# Patient Record
Sex: Female | Born: 1960 | Race: White | Hispanic: No | Smoking: Current every day smoker
Health system: Southern US, Community
[De-identification: ages and names within clinical notes are randomized; demographics above are authoritative.]

## PROBLEM LIST (undated history)

## (undated) DIAGNOSIS — I10 Essential (primary) hypertension: Secondary | ICD-10-CM

## (undated) DIAGNOSIS — Q159 Congenital malformation of eye, unspecified: Secondary | ICD-10-CM

## (undated) DIAGNOSIS — E785 Hyperlipidemia, unspecified: Secondary | ICD-10-CM

## (undated) DIAGNOSIS — I739 Peripheral vascular disease, unspecified: Secondary | ICD-10-CM

## (undated) DIAGNOSIS — F5104 Psychophysiologic insomnia: Secondary | ICD-10-CM

## (undated) DIAGNOSIS — F32A Depression, unspecified: Secondary | ICD-10-CM

## (undated) DIAGNOSIS — F329 Major depressive disorder, single episode, unspecified: Secondary | ICD-10-CM

## (undated) DIAGNOSIS — K219 Gastro-esophageal reflux disease without esophagitis: Secondary | ICD-10-CM

## (undated) DIAGNOSIS — M549 Dorsalgia, unspecified: Secondary | ICD-10-CM

## (undated) DIAGNOSIS — I2510 Atherosclerotic heart disease of native coronary artery without angina pectoris: Secondary | ICD-10-CM

## (undated) DIAGNOSIS — R0789 Other chest pain: Secondary | ICD-10-CM

## (undated) DIAGNOSIS — Z720 Tobacco use: Secondary | ICD-10-CM

## (undated) HISTORY — DX: Tobacco use: Z72.0

## (undated) HISTORY — DX: Hyperlipidemia, unspecified: E78.5

## (undated) HISTORY — DX: Major depressive disorder, single episode, unspecified: F32.9

## (undated) HISTORY — DX: Essential (primary) hypertension: I10

## (undated) HISTORY — DX: Peripheral vascular disease, unspecified: I73.9

## (undated) HISTORY — DX: Other chest pain: R07.89

## (undated) HISTORY — DX: Atherosclerotic heart disease of native coronary artery without angina pectoris: I25.10

## (undated) HISTORY — DX: Psychophysiologic insomnia: F51.04

## (undated) HISTORY — PX: TUBAL LIGATION: SHX77

## (undated) HISTORY — DX: Depression, unspecified: F32.A

---

## 1994-01-01 HISTORY — PX: BUNIONECTOMY: SHX129

## 1997-01-01 HISTORY — PX: NASAL SINUS SURGERY: SHX719

## 2003-01-02 HISTORY — PX: WRIST SURGERY: SHX841

## 2003-06-21 ENCOUNTER — Encounter: Payer: Self-pay | Admitting: Internal Medicine

## 2003-06-21 LAB — CONVERTED CEMR LAB

## 2004-10-06 ENCOUNTER — Ambulatory Visit: Payer: Self-pay | Admitting: Emergency Medicine

## 2004-10-11 ENCOUNTER — Ambulatory Visit: Payer: Self-pay | Admitting: Emergency Medicine

## 2004-11-16 ENCOUNTER — Ambulatory Visit: Payer: Self-pay | Admitting: Internal Medicine

## 2004-11-20 ENCOUNTER — Ambulatory Visit: Payer: Self-pay | Admitting: Internal Medicine

## 2004-12-11 ENCOUNTER — Ambulatory Visit: Payer: Self-pay | Admitting: Emergency Medicine

## 2004-12-26 ENCOUNTER — Ambulatory Visit: Payer: Self-pay | Admitting: Internal Medicine

## 2005-05-01 ENCOUNTER — Emergency Department (HOSPITAL_COMMUNITY): Admission: EM | Admit: 2005-05-01 | Discharge: 2005-05-01 | Payer: Self-pay | Admitting: *Deleted

## 2005-09-04 ENCOUNTER — Ambulatory Visit: Payer: Self-pay | Admitting: Emergency Medicine

## 2005-09-25 ENCOUNTER — Ambulatory Visit: Payer: Self-pay | Admitting: Pulmonary Disease

## 2005-10-18 ENCOUNTER — Ambulatory Visit: Payer: Self-pay | Admitting: Internal Medicine

## 2005-11-01 ENCOUNTER — Ambulatory Visit: Payer: Self-pay | Admitting: Internal Medicine

## 2005-11-29 ENCOUNTER — Ambulatory Visit: Payer: Self-pay | Admitting: Internal Medicine

## 2006-01-22 ENCOUNTER — Ambulatory Visit (HOSPITAL_COMMUNITY): Admission: RE | Admit: 2006-01-22 | Discharge: 2006-01-22 | Payer: Self-pay | Admitting: Internal Medicine

## 2006-01-22 ENCOUNTER — Ambulatory Visit: Payer: Self-pay | Admitting: Internal Medicine

## 2006-01-29 ENCOUNTER — Ambulatory Visit: Payer: Self-pay | Admitting: Internal Medicine

## 2006-01-29 LAB — CONVERTED CEMR LAB
BUN: 7 mg/dL (ref 6–23)
CO2: 29 meq/L (ref 19–32)
Calcium: 8.9 mg/dL (ref 8.4–10.5)
Chloride: 100 meq/L (ref 96–112)
Creatinine, Ser: 0.9 mg/dL (ref 0.4–1.2)
GFR calc Af Amer: 86 mL/min
GFR calc non Af Amer: 71 mL/min
Glucose, Bld: 103 mg/dL — ABNORMAL HIGH (ref 70–99)
Potassium: 3.5 meq/L (ref 3.5–5.1)
Sodium: 136 meq/L (ref 135–145)

## 2006-02-16 ENCOUNTER — Ambulatory Visit (HOSPITAL_COMMUNITY): Admission: RE | Admit: 2006-02-16 | Discharge: 2006-02-16 | Payer: Self-pay | Admitting: Internal Medicine

## 2006-05-07 ENCOUNTER — Ambulatory Visit: Payer: Self-pay | Admitting: Internal Medicine

## 2006-06-21 ENCOUNTER — Ambulatory Visit (HOSPITAL_COMMUNITY): Admission: RE | Admit: 2006-06-21 | Discharge: 2006-06-21 | Payer: Self-pay | Admitting: Neurosurgery

## 2006-06-21 HISTORY — PX: CERVICAL DISCECTOMY: SHX98

## 2006-08-20 ENCOUNTER — Encounter: Admission: RE | Admit: 2006-08-20 | Discharge: 2006-08-20 | Payer: Self-pay | Admitting: Neurosurgery

## 2006-09-23 ENCOUNTER — Observation Stay (HOSPITAL_COMMUNITY): Admission: AD | Admit: 2006-09-23 | Discharge: 2006-09-26 | Payer: Self-pay | Admitting: Internal Medicine

## 2006-09-23 ENCOUNTER — Ambulatory Visit: Payer: Self-pay | Admitting: Internal Medicine

## 2006-09-23 ENCOUNTER — Ambulatory Visit: Payer: Self-pay | Admitting: Cardiology

## 2006-10-24 ENCOUNTER — Ambulatory Visit: Payer: Self-pay | Admitting: Internal Medicine

## 2007-01-01 ENCOUNTER — Ambulatory Visit: Payer: Self-pay | Admitting: Internal Medicine

## 2007-01-01 DIAGNOSIS — G2581 Restless legs syndrome: Secondary | ICD-10-CM | POA: Insufficient documentation

## 2007-01-01 DIAGNOSIS — D649 Anemia, unspecified: Secondary | ICD-10-CM | POA: Insufficient documentation

## 2007-01-01 DIAGNOSIS — G478 Other sleep disorders: Secondary | ICD-10-CM | POA: Insufficient documentation

## 2007-01-01 DIAGNOSIS — E785 Hyperlipidemia, unspecified: Secondary | ICD-10-CM | POA: Insufficient documentation

## 2007-01-01 DIAGNOSIS — I10 Essential (primary) hypertension: Secondary | ICD-10-CM | POA: Insufficient documentation

## 2007-01-01 DIAGNOSIS — F17200 Nicotine dependence, unspecified, uncomplicated: Secondary | ICD-10-CM | POA: Insufficient documentation

## 2007-01-01 DIAGNOSIS — F3341 Major depressive disorder, recurrent, in partial remission: Secondary | ICD-10-CM | POA: Insufficient documentation

## 2007-01-01 DIAGNOSIS — G4700 Insomnia, unspecified: Secondary | ICD-10-CM | POA: Insufficient documentation

## 2007-01-01 LAB — CONVERTED CEMR LAB
ALT: 13 units/L (ref 0–35)
AST: 14 units/L (ref 0–37)
Albumin: 3.4 g/dL — ABNORMAL LOW (ref 3.5–5.2)
Alkaline Phosphatase: 97 units/L (ref 39–117)
BUN: 8 mg/dL (ref 6–23)
Bilirubin Urine: NEGATIVE
Bilirubin, Direct: 0.1 mg/dL (ref 0.0–0.3)
CO2: 28 meq/L (ref 19–32)
Calcium: 9.1 mg/dL (ref 8.4–10.5)
Chloride: 106 meq/L (ref 96–112)
Cholesterol: 189 mg/dL (ref 0–200)
Creatinine, Ser: 0.8 mg/dL (ref 0.4–1.2)
Crystals: NEGATIVE
GFR calc Af Amer: 98 mL/min
GFR calc non Af Amer: 81 mL/min
Glucose, Bld: 89 mg/dL (ref 70–99)
HDL: 26.6 mg/dL — ABNORMAL LOW (ref >39.0)
Hemoglobin, Urine: NEGATIVE
Ketones, ur: NEGATIVE mg/dL
LDL Cholesterol: 140 mg/dL — ABNORMAL HIGH (ref 0–99)
Leukocytes, UA: NEGATIVE
Mucus, UA: NEGATIVE
Nitrite: NEGATIVE
Potassium: 4.4 meq/L (ref 3.5–5.1)
RBC / HPF: NONE SEEN
Sodium: 140 meq/L (ref 135–145)
Specific Gravity, Urine: <=1.005 (ref 1.000–1.03)
TSH: 1.1 microintl units/mL (ref 0.35–5.50)
Total Bilirubin: 0.4 mg/dL (ref 0.3–1.2)
Total CHOL/HDL Ratio: 7.1
Total Protein, Urine: NEGATIVE mg/dL
Total Protein: 6 g/dL (ref 6.0–8.3)
Triglycerides: 111 mg/dL (ref 0–149)
Urine Glucose: NEGATIVE mg/dL
Urobilinogen, UA: 0.2 (ref 0.0–1.0)
VLDL: 22 mg/dL (ref 0–40)
WBC, UA: NONE SEEN cells/hpf
pH: 6 (ref 5.0–8.0)

## 2007-01-10 LAB — CONVERTED CEMR LAB: CRP: 0.5 mg/dL (ref <0.6)

## 2007-02-26 ENCOUNTER — Encounter: Payer: Self-pay | Admitting: Internal Medicine

## 2007-04-04 ENCOUNTER — Ambulatory Visit: Payer: Self-pay | Admitting: Internal Medicine

## 2007-04-04 DIAGNOSIS — N926 Irregular menstruation, unspecified: Secondary | ICD-10-CM | POA: Insufficient documentation

## 2007-04-09 ENCOUNTER — Telehealth: Payer: Self-pay | Admitting: Internal Medicine

## 2007-05-13 ENCOUNTER — Telehealth: Payer: Self-pay | Admitting: Internal Medicine

## 2007-05-14 ENCOUNTER — Ambulatory Visit: Payer: Self-pay | Admitting: Internal Medicine

## 2007-06-10 ENCOUNTER — Telehealth: Payer: Self-pay | Admitting: Internal Medicine

## 2007-06-24 ENCOUNTER — Telehealth: Payer: Self-pay | Admitting: Internal Medicine

## 2007-06-24 ENCOUNTER — Ambulatory Visit: Payer: Self-pay | Admitting: Internal Medicine

## 2007-06-24 DIAGNOSIS — M79609 Pain in unspecified limb: Secondary | ICD-10-CM | POA: Insufficient documentation

## 2007-06-24 LAB — CONVERTED CEMR LAB: Total CK: 67 units/L (ref 7–177)

## 2007-06-27 ENCOUNTER — Telehealth: Payer: Self-pay | Admitting: Internal Medicine

## 2007-07-07 ENCOUNTER — Encounter: Payer: Self-pay | Admitting: Internal Medicine

## 2007-07-10 ENCOUNTER — Telehealth: Payer: Self-pay | Admitting: Internal Medicine

## 2007-07-10 DIAGNOSIS — I739 Peripheral vascular disease, unspecified: Secondary | ICD-10-CM | POA: Insufficient documentation

## 2007-07-28 ENCOUNTER — Telehealth: Payer: Self-pay | Admitting: Internal Medicine

## 2007-08-26 ENCOUNTER — Ambulatory Visit: Payer: Self-pay | Admitting: Internal Medicine

## 2007-11-03 ENCOUNTER — Ambulatory Visit: Payer: Self-pay | Admitting: Cardiovascular Disease

## 2007-11-19 ENCOUNTER — Ambulatory Visit: Payer: Self-pay | Admitting: Cardiovascular Disease

## 2007-11-19 LAB — CONVERTED CEMR LAB
BUN: 4 mg/dL — ABNORMAL LOW (ref 6–23)
Basophils Absolute: 0 10*3/uL (ref 0.0–0.1)
Basophils Relative: 0.3 % (ref 0.0–3.0)
CO2: 29 meq/L (ref 19–32)
Calcium: 9.2 mg/dL (ref 8.4–10.5)
Chloride: 101 meq/L (ref 96–112)
Creatinine, Ser: 0.8 mg/dL (ref 0.4–1.2)
Eosinophils Absolute: 0.2 10*3/uL (ref 0.0–0.7)
Eosinophils Relative: 1.2 % (ref 0.0–5.0)
GFR calc Af Amer: 98 mL/min
GFR calc non Af Amer: 81 mL/min
Glucose, Bld: 91 mg/dL (ref 70–99)
HCT: 32.6 % — ABNORMAL LOW (ref 36.0–46.0)
Hemoglobin: 10.6 g/dL — ABNORMAL LOW (ref 12.0–15.0)
INR: 1 (ref 0.8–1.0)
Lymphocytes Relative: 15.8 % (ref 12.0–46.0)
MCHC: 32.6 g/dL (ref 30.0–36.0)
MCV: 74.8 fL — ABNORMAL LOW (ref 78.0–100.0)
Monocytes Absolute: 1.1 10*3/uL — ABNORMAL HIGH (ref 0.1–1.0)
Monocytes Relative: 7.6 % (ref 3.0–12.0)
Neutro Abs: 10.5 10*3/uL — ABNORMAL HIGH (ref 1.4–7.7)
Neutrophils Relative %: 75.1 % (ref 43.0–77.0)
Platelets: 367 10*3/uL (ref 150–400)
Potassium: 3.6 meq/L (ref 3.5–5.1)
Prothrombin Time: 11.7 s (ref 10.9–13.3)
RBC: 4.35 M/uL (ref 3.87–5.11)
RDW: 17.5 % — ABNORMAL HIGH (ref 11.5–14.6)
Sodium: 136 meq/L (ref 135–145)
WBC: 14 10*3/uL — ABNORMAL HIGH (ref 4.5–10.5)

## 2007-11-26 ENCOUNTER — Ambulatory Visit (HOSPITAL_COMMUNITY): Admission: RE | Admit: 2007-11-26 | Discharge: 2007-11-26 | Payer: Self-pay | Admitting: Cardiovascular Disease

## 2007-11-26 ENCOUNTER — Encounter: Payer: Self-pay | Admitting: Internal Medicine

## 2007-11-26 ENCOUNTER — Ambulatory Visit: Payer: Self-pay | Admitting: Cardiovascular Disease

## 2007-12-08 ENCOUNTER — Ambulatory Visit: Payer: Self-pay | Admitting: Cardiovascular Disease

## 2008-02-26 ENCOUNTER — Ambulatory Visit (HOSPITAL_BASED_OUTPATIENT_CLINIC_OR_DEPARTMENT_OTHER): Admission: RE | Admit: 2008-02-26 | Discharge: 2008-02-26 | Payer: Self-pay | Admitting: Internal Medicine

## 2008-02-26 ENCOUNTER — Ambulatory Visit: Payer: Self-pay | Admitting: Diagnostic Radiology

## 2008-02-26 ENCOUNTER — Ambulatory Visit: Payer: Self-pay | Admitting: Internal Medicine

## 2008-02-26 DIAGNOSIS — R05 Cough: Secondary | ICD-10-CM

## 2008-02-26 DIAGNOSIS — R059 Cough, unspecified: Secondary | ICD-10-CM | POA: Insufficient documentation

## 2008-02-27 ENCOUNTER — Encounter: Payer: Self-pay | Admitting: Internal Medicine

## 2008-02-27 LAB — CONVERTED CEMR LAB
Basophils Absolute: 0 10*3/uL (ref 0.0–0.1)
Basophils Relative: 0 % (ref 0–1)
Eosinophils Absolute: 0.2 10*3/uL (ref 0.0–0.7)
Eosinophils Relative: 2 % (ref 0–5)
Ferritin: 6 ng/mL — ABNORMAL LOW (ref 10–291)
HCT: 31.2 % — ABNORMAL LOW (ref 36.0–46.0)
Hemoglobin: 9.7 g/dL — ABNORMAL LOW (ref 12.0–15.0)
Iron: 13 ug/dL — ABNORMAL LOW (ref 42–145)
Lymphocytes Relative: 26 % (ref 12–46)
Lymphs Abs: 3.3 10*3/uL (ref 0.7–4.0)
MCHC: 31.1 g/dL (ref 30.0–36.0)
MCV: 75.2 fL — ABNORMAL LOW (ref 78.0–100.0)
Monocytes Absolute: 0.7 10*3/uL (ref 0.1–1.0)
Monocytes Relative: 5 % (ref 3–12)
Neutro Abs: 8.8 10*3/uL — ABNORMAL HIGH (ref 1.7–7.7)
Neutrophils Relative %: 68 % (ref 43–77)
Platelets: 340 10*3/uL (ref 150–400)
RBC: 4.15 M/uL (ref 3.87–5.11)
RDW: 18 % — ABNORMAL HIGH (ref 11.5–15.5)
Saturation Ratios: 3 % — ABNORMAL LOW (ref 20–55)
TIBC: 447 ug/dL (ref 250–470)
Transferrin: 376 mg/dL — ABNORMAL HIGH (ref 212–360)
UIBC: 434 ug/dL
WBC: 13 10*3/uL — ABNORMAL HIGH (ref 4.0–10.5)

## 2008-02-29 ENCOUNTER — Encounter: Payer: Self-pay | Admitting: Internal Medicine

## 2008-03-15 ENCOUNTER — Telehealth: Payer: Self-pay | Admitting: Internal Medicine

## 2008-03-16 ENCOUNTER — Ambulatory Visit: Payer: Self-pay | Admitting: Internal Medicine

## 2008-03-16 DIAGNOSIS — J209 Acute bronchitis, unspecified: Secondary | ICD-10-CM | POA: Insufficient documentation

## 2008-03-16 DIAGNOSIS — N39498 Other specified urinary incontinence: Secondary | ICD-10-CM | POA: Insufficient documentation

## 2008-03-22 ENCOUNTER — Telehealth: Payer: Self-pay | Admitting: Internal Medicine

## 2008-03-23 ENCOUNTER — Telehealth: Payer: Self-pay | Admitting: Internal Medicine

## 2008-04-12 ENCOUNTER — Ambulatory Visit: Payer: Self-pay | Admitting: Radiology

## 2008-04-12 ENCOUNTER — Ambulatory Visit (HOSPITAL_BASED_OUTPATIENT_CLINIC_OR_DEPARTMENT_OTHER): Admission: RE | Admit: 2008-04-12 | Discharge: 2008-04-12 | Payer: Self-pay | Admitting: Internal Medicine

## 2008-04-12 ENCOUNTER — Ambulatory Visit: Payer: Self-pay | Admitting: Internal Medicine

## 2008-04-26 ENCOUNTER — Ambulatory Visit: Payer: Self-pay | Admitting: Oncology

## 2008-04-28 LAB — CONVERTED CEMR LAB: Pap Smear: NORMAL

## 2008-04-29 ENCOUNTER — Telehealth: Payer: Self-pay | Admitting: Internal Medicine

## 2008-04-29 ENCOUNTER — Encounter: Payer: Self-pay | Admitting: Internal Medicine

## 2008-04-29 LAB — CHCC SMEAR

## 2008-04-29 LAB — CBC WITH DIFFERENTIAL/PLATELET
Basophils Absolute: 0 10*3/uL (ref 0.0–0.1)
Eosinophils Absolute: 0.1 10*3/uL (ref 0.0–0.5)
HGB: 7.2 g/dL — ABNORMAL LOW (ref 11.6–15.9)
MONO#: 0.6 10*3/uL (ref 0.1–0.9)
NEUT#: 7.5 10*3/uL — ABNORMAL HIGH (ref 1.5–6.5)
Platelets: 300 10*3/uL (ref 145–400)
RBC: 3.11 10*6/uL — ABNORMAL LOW (ref 3.70–5.45)
RDW: 19.1 % — ABNORMAL HIGH (ref 11.2–14.5)
WBC: 10.5 10*3/uL — ABNORMAL HIGH (ref 3.9–10.3)

## 2008-04-29 LAB — COMPREHENSIVE METABOLIC PANEL
ALT: 11 U/L (ref 0–35)
AST: 15 U/L (ref 0–37)
Alkaline Phosphatase: 102 U/L (ref 39–117)
Creatinine, Ser: 0.66 mg/dL (ref 0.40–1.20)
Total Bilirubin: 0.5 mg/dL (ref 0.3–1.2)

## 2008-04-29 LAB — MORPHOLOGY: PLT EST: ADEQUATE

## 2008-04-30 ENCOUNTER — Encounter (HOSPITAL_COMMUNITY): Admission: RE | Admit: 2008-04-30 | Discharge: 2008-05-06 | Payer: Self-pay | Admitting: Oncology

## 2008-04-30 LAB — HAPTOGLOBIN: Haptoglobin: 154 mg/dL (ref 16–200)

## 2008-04-30 LAB — VITAMIN B12: Vitamin B-12: 300 pg/mL (ref 211–911)

## 2008-04-30 LAB — DIRECT ANTIGLOBULIN TEST (NOT AT ARMC): DAT (Complement): NEGATIVE

## 2008-04-30 LAB — FOLATE: Folate: 7 ng/mL

## 2008-04-30 LAB — IRON AND TIBC: %SAT: 3 % — ABNORMAL LOW (ref 20–55)

## 2008-04-30 LAB — FERRITIN: Ferritin: 6 ng/mL — ABNORMAL LOW (ref 10–291)

## 2008-04-30 LAB — TYPE & CROSSMATCH - CHCC

## 2008-05-05 ENCOUNTER — Telehealth: Payer: Self-pay | Admitting: Internal Medicine

## 2008-05-13 LAB — CBC WITH DIFFERENTIAL/PLATELET
BASO%: 0.1 % (ref 0.0–2.0)
Basophils Absolute: 0 10*3/uL (ref 0.0–0.1)
EOS%: 0.9 % (ref 0.0–7.0)
MCH: 26.5 pg (ref 25.1–34.0)
MCHC: 32.8 g/dL (ref 31.5–36.0)
MCV: 80.6 fL (ref 79.5–101.0)
MONO%: 3.1 % (ref 0.0–14.0)
RBC: 3.41 10*6/uL — ABNORMAL LOW (ref 3.70–5.45)
RDW: 22.3 % — ABNORMAL HIGH (ref 11.2–14.5)
lymph#: 2.8 10*3/uL (ref 0.9–3.3)

## 2008-05-26 ENCOUNTER — Telehealth: Payer: Self-pay | Admitting: Internal Medicine

## 2008-05-27 LAB — CBC WITH DIFFERENTIAL/PLATELET
Eosinophils Absolute: 0.2 10*3/uL (ref 0.0–0.5)
HGB: 11.6 g/dL (ref 11.6–15.9)
MCV: 84.6 fL (ref 79.5–101.0)
MONO%: 5.1 % (ref 0.0–14.0)
NEUT#: 7.5 10*3/uL — ABNORMAL HIGH (ref 1.5–6.5)
RBC: 4.11 10*6/uL (ref 3.70–5.45)
RDW: 27 % — ABNORMAL HIGH (ref 11.2–14.5)
WBC: 10.8 10*3/uL — ABNORMAL HIGH (ref 3.9–10.3)
lymph#: 2.6 10*3/uL (ref 0.9–3.3)

## 2008-06-01 ENCOUNTER — Ambulatory Visit (HOSPITAL_COMMUNITY): Admission: RE | Admit: 2008-06-01 | Discharge: 2008-06-01 | Payer: Self-pay | Admitting: Obstetrics and Gynecology

## 2008-06-01 ENCOUNTER — Encounter (INDEPENDENT_AMBULATORY_CARE_PROVIDER_SITE_OTHER): Payer: Self-pay | Admitting: Obstetrics and Gynecology

## 2008-06-01 HISTORY — PX: ENDOMETRIAL ABLATION: SHX621

## 2008-06-08 ENCOUNTER — Ambulatory Visit: Payer: Self-pay | Admitting: Oncology

## 2008-07-08 ENCOUNTER — Telehealth: Payer: Self-pay | Admitting: Internal Medicine

## 2008-07-20 ENCOUNTER — Ambulatory Visit: Payer: Self-pay | Admitting: Oncology

## 2008-08-24 ENCOUNTER — Telehealth: Payer: Self-pay | Admitting: Internal Medicine

## 2008-09-20 ENCOUNTER — Telehealth: Payer: Self-pay | Admitting: Internal Medicine

## 2008-10-04 ENCOUNTER — Telehealth: Payer: Self-pay | Admitting: Internal Medicine

## 2008-11-30 ENCOUNTER — Telehealth: Payer: Self-pay | Admitting: Internal Medicine

## 2009-03-14 ENCOUNTER — Ambulatory Visit: Payer: Self-pay | Admitting: Diagnostic Radiology

## 2009-03-14 ENCOUNTER — Ambulatory Visit (HOSPITAL_BASED_OUTPATIENT_CLINIC_OR_DEPARTMENT_OTHER): Admission: RE | Admit: 2009-03-14 | Discharge: 2009-03-14 | Payer: Self-pay | Admitting: Internal Medicine

## 2009-03-14 ENCOUNTER — Encounter (INDEPENDENT_AMBULATORY_CARE_PROVIDER_SITE_OTHER): Payer: Self-pay | Admitting: *Deleted

## 2009-03-14 ENCOUNTER — Ambulatory Visit: Payer: Self-pay | Admitting: Internal Medicine

## 2009-03-14 LAB — CONVERTED CEMR LAB
ALT: 11 units/L (ref 0–35)
AST: 13 units/L (ref 0–37)
Albumin: 4.3 g/dL (ref 3.5–5.2)
Alkaline Phosphatase: 101 units/L (ref 39–117)
BUN: 7 mg/dL (ref 6–23)
Bilirubin, Direct: 0.1 mg/dL (ref 0.0–0.3)
CO2: 23 meq/L (ref 19–32)
Calcium: 9.8 mg/dL (ref 8.4–10.5)
Chloride: 106 meq/L (ref 96–112)
Cholesterol: 115 mg/dL (ref 0–200)
Creatinine, Ser: 0.79 mg/dL (ref 0.40–1.20)
Glucose, Bld: 70 mg/dL (ref 70–99)
HCT: 44.7 % (ref 36.0–46.0)
HDL: 31 mg/dL — ABNORMAL LOW (ref >39)
Hemoglobin: 14.7 g/dL (ref 12.0–15.0)
Indirect Bilirubin: 0.2 mg/dL (ref 0.0–0.9)
LDL Cholesterol: 56 mg/dL (ref 0–99)
MCHC: 32.9 g/dL (ref 30.0–36.0)
MCV: 92.9 fL (ref 78.0–100.0)
Platelets: 361 10*3/uL (ref 150–400)
Potassium: 4.1 meq/L (ref 3.5–5.3)
RBC: 4.81 M/uL (ref 3.87–5.11)
RDW: 14.1 % (ref 11.5–15.5)
Sodium: 143 meq/L (ref 135–145)
TSH: 2.022 microintl units/mL (ref 0.350–4.500)
Total Bilirubin: 0.3 mg/dL (ref 0.3–1.2)
Total CHOL/HDL Ratio: 3.7
Total Protein: 6.6 g/dL (ref 6.0–8.3)
Triglycerides: 141 mg/dL (ref <150)
VLDL: 28 mg/dL (ref 0–40)
WBC: 18 10*3/uL — ABNORMAL HIGH (ref 4.0–10.5)

## 2009-03-14 LAB — HM MAMMOGRAPHY: HM Mammogram: NEGATIVE

## 2009-03-16 ENCOUNTER — Encounter (INDEPENDENT_AMBULATORY_CARE_PROVIDER_SITE_OTHER): Payer: Self-pay | Admitting: *Deleted

## 2009-03-17 ENCOUNTER — Telehealth: Payer: Self-pay | Admitting: Internal Medicine

## 2009-03-17 ENCOUNTER — Ambulatory Visit: Payer: Self-pay | Admitting: Gastroenterology

## 2009-03-17 DIAGNOSIS — D72829 Elevated white blood cell count, unspecified: Secondary | ICD-10-CM | POA: Insufficient documentation

## 2009-03-18 ENCOUNTER — Ambulatory Visit: Payer: Self-pay | Admitting: Hematology & Oncology

## 2009-03-21 ENCOUNTER — Telehealth: Payer: Self-pay | Admitting: Internal Medicine

## 2009-04-19 ENCOUNTER — Ambulatory Visit: Payer: Self-pay | Admitting: Hematology & Oncology

## 2009-05-03 ENCOUNTER — Telehealth: Payer: Self-pay | Admitting: Internal Medicine

## 2009-07-01 ENCOUNTER — Ambulatory Visit: Payer: Self-pay | Admitting: Diagnostic Radiology

## 2009-07-01 ENCOUNTER — Ambulatory Visit (HOSPITAL_BASED_OUTPATIENT_CLINIC_OR_DEPARTMENT_OTHER): Admission: RE | Admit: 2009-07-01 | Discharge: 2009-07-01 | Payer: Self-pay | Admitting: Internal Medicine

## 2009-07-01 ENCOUNTER — Ambulatory Visit: Payer: Self-pay | Admitting: Internal Medicine

## 2009-07-01 DIAGNOSIS — J309 Allergic rhinitis, unspecified: Secondary | ICD-10-CM | POA: Insufficient documentation

## 2009-07-01 LAB — CONVERTED CEMR LAB: IgE (Immunoglobulin E), Serum: 139.3 intl units/mL (ref 0.0–180.0)

## 2009-07-08 ENCOUNTER — Telehealth: Payer: Self-pay | Admitting: Internal Medicine

## 2009-09-12 ENCOUNTER — Ambulatory Visit: Payer: Self-pay | Admitting: Internal Medicine

## 2009-09-12 LAB — CONVERTED CEMR LAB
ALT: 11 units/L (ref 0–35)
AST: 13 units/L (ref 0–37)
Albumin: 4.2 g/dL (ref 3.5–5.2)
Alkaline Phosphatase: 92 units/L (ref 39–117)
BUN: 15 mg/dL (ref 6–23)
Bilirubin, Direct: 0.1 mg/dL (ref 0.0–0.3)
CO2: 26 meq/L (ref 19–32)
Calcium: 9.9 mg/dL (ref 8.4–10.5)
Chloride: 104 meq/L (ref 96–112)
Cholesterol: 212 mg/dL — ABNORMAL HIGH (ref 0–200)
Creatinine, Ser: 0.82 mg/dL (ref 0.40–1.20)
Glucose, Bld: 74 mg/dL (ref 70–99)
HDL: 34 mg/dL — ABNORMAL LOW (ref >39)
Indirect Bilirubin: 0.4 mg/dL (ref 0.0–0.9)
LDL Cholesterol: 147 mg/dL — ABNORMAL HIGH (ref 0–99)
Potassium: 4.3 meq/L (ref 3.5–5.3)
Sodium: 140 meq/L (ref 135–145)
Total Bilirubin: 0.5 mg/dL (ref 0.3–1.2)
Total CHOL/HDL Ratio: 6.2
Total CK: 44 units/L (ref 7–177)
Total Protein: 6.4 g/dL (ref 6.0–8.3)
Triglycerides: 153 mg/dL — ABNORMAL HIGH (ref <150)
VLDL: 31 mg/dL (ref 0–40)

## 2009-09-13 ENCOUNTER — Encounter: Payer: Self-pay | Admitting: Internal Medicine

## 2009-09-16 ENCOUNTER — Telehealth: Payer: Self-pay | Admitting: Internal Medicine

## 2009-09-19 ENCOUNTER — Telehealth: Payer: Self-pay | Admitting: Internal Medicine

## 2009-10-03 ENCOUNTER — Telehealth: Payer: Self-pay | Admitting: Internal Medicine

## 2009-10-05 ENCOUNTER — Ambulatory Visit (HOSPITAL_BASED_OUTPATIENT_CLINIC_OR_DEPARTMENT_OTHER)
Admission: RE | Admit: 2009-10-05 | Discharge: 2009-10-05 | Payer: Self-pay | Source: Home / Self Care | Admitting: Internal Medicine

## 2009-10-05 ENCOUNTER — Ambulatory Visit: Payer: Self-pay | Admitting: Internal Medicine

## 2009-10-05 ENCOUNTER — Ambulatory Visit: Payer: Self-pay | Admitting: Radiology

## 2009-10-05 DIAGNOSIS — M545 Low back pain, unspecified: Secondary | ICD-10-CM | POA: Insufficient documentation

## 2009-10-07 ENCOUNTER — Telehealth: Payer: Self-pay | Admitting: Internal Medicine

## 2009-10-24 ENCOUNTER — Encounter: Payer: Self-pay | Admitting: Internal Medicine

## 2009-11-07 ENCOUNTER — Telehealth: Payer: Self-pay | Admitting: Internal Medicine

## 2009-12-12 ENCOUNTER — Telehealth: Payer: Self-pay | Admitting: Internal Medicine

## 2010-01-18 ENCOUNTER — Ambulatory Visit (HOSPITAL_BASED_OUTPATIENT_CLINIC_OR_DEPARTMENT_OTHER)
Admission: RE | Admit: 2010-01-18 | Discharge: 2010-01-18 | Payer: Self-pay | Source: Home / Self Care | Attending: Internal Medicine | Admitting: Internal Medicine

## 2010-01-18 ENCOUNTER — Ambulatory Visit
Admission: RE | Admit: 2010-01-18 | Discharge: 2010-01-18 | Payer: Self-pay | Source: Home / Self Care | Attending: Internal Medicine | Admitting: Internal Medicine

## 2010-01-18 DIAGNOSIS — M25559 Pain in unspecified hip: Secondary | ICD-10-CM | POA: Insufficient documentation

## 2010-01-18 DIAGNOSIS — M25552 Pain in left hip: Secondary | ICD-10-CM | POA: Insufficient documentation

## 2010-01-22 ENCOUNTER — Encounter: Payer: Self-pay | Admitting: Internal Medicine

## 2010-01-27 ENCOUNTER — Telehealth: Payer: Self-pay | Admitting: Internal Medicine

## 2010-01-27 ENCOUNTER — Encounter (HOSPITAL_COMMUNITY)
Admission: RE | Admit: 2010-01-27 | Discharge: 2010-01-31 | Payer: Self-pay | Source: Home / Self Care | Attending: Internal Medicine | Admitting: Internal Medicine

## 2010-01-31 NOTE — Progress Notes (Signed)
Summary: insurance will not pay for colonoscopy   Phone Note Call from Patient Call back at Work Phone [phone]   Caller: Patient Call For: Reinaldo Helt  Summary of Call: was in last Monday and was told she needs a colonoscopy.  Her insurance will not pay for the colonoscopy.  Will she be ok without the test.   Her white blood cell count was high and she is [facility].  She wants to know how long her blood cell count has been high.  She has an appt on the 30th of this month.  Is that too long to wait.   Initial call taken by: Roselle Locus,  March 21, 2009 3:10 PM  Follow-up for Phone Call        discussed elevated white count.  she has mild elevation in the past.  no hx of splenic injury.  she will f/u with hematology for further eval.  I suggest she complete fobt immunoassay.  Darlene, please mail kit to pt  (Give time frame for pt to complete 1-2 weeks) Follow-up by: [name] DO,  March 21, 2009 4:29 PM  Additional Follow-up for Phone Call Additional follow up Details #1::        Card mailed to patient Additional Follow-up by: Glendell Docker CMA,  March 22, 2009 8:30 AM

## 2010-01-31 NOTE — Progress Notes (Signed)
Summary: Results  Phone Note Call from Patient Call back at Home Phone [phone]   Summary of Call: Patient is requesting results of "pressure test"  Initial call taken by: Lamar Sprinkles,  July 10, 2007 9:52 AM  Follow-up for Phone Call        we have results for [facility].  Please confirm with pt that this is the name she used.  left message on pt's ans machine to call [facility] office re: results. Follow-up by: [name] DO,  July 10, 2007 1:15 PM  Additional Follow-up for Phone Call Additional follow up Details #1::        discussed results of ABI.  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  I rec start of pletal and follow up with vasc specialist.  Pt instructed to restart simvastatin.  Discussed importance of tob cessation. Additional Follow-up by: [name] DO,  July 11, 2007 10:10 AM  New Problems: CLAUDICATION (ICD-443.9)   New Problems: CLAUDICATION (ICD-443.9) New/Updated Medications: PLETAL 100 MG  TABS (CILOSTAZOL) one by mouth two times a day   Prescriptions: PLETAL 100 MG  TABS (CILOSTAZOL) one by mouth two times a day  #60 x 1   Entered and Authorized by:   [name] DO   Signed by:   [name] DO on 07/11/2007   Method used:   Electronically sent to ...       [facility] [address]*       [address] [place], [place]  [address]       Ph: [phone]       Fax: [phone]   RxID:   [id number]

## 2010-01-31 NOTE — Miscellaneous (Signed)
Summary: [facility] PV  Clinical Lists Changes  Medications: Added new medication of MOVIPREP 100 GM  SOLR (PEG-KCL-NACL-NASULF-NA ASC-C) As per prep instructions. - Signed Rx of MOVIPREP 100 GM  SOLR (PEG-KCL-NACL-NASULF-NA ASC-C) As per prep instructions.;  #1 x 0;  Signed;  Entered by: Ezra Sites RN;  Authorized by: Rachael Fee MD;  Method used: Electronically to [id number] [address], [place], [place]  [address], Ph: [phone] or [phone], Fax: [phone] Observations: Added new observation of ALLERGY REV: Done (03/17/2009 16:00)    Prescriptions: MOVIPREP 100 GM  SOLR (PEG-KCL-NACL-NASULF-NA ASC-C) As per prep instructions.  #1 x 0   Entered by:   Ezra Sites RN   Authorized by:   Rachael Fee MD   Signed by:   Ezra Sites RN on 03/17/2009   Method used:   Electronically to        [id number] (retail)       [address]       [place], [place]  [address]       Ph: [phone] or [phone]       Fax: [phone]   RxID:   [id number]

## 2010-01-31 NOTE — Letter (Signed)
Summary: [facility] Instructions  [facility]  [address] [place], [place] [address]   Phone: [phone]  Fax: [phone]       [name] [name]    1961-01-01    MRN: [id number]        Procedure Day Dorna Bloom:  Wednesday  03/30/2009     Arrival Time: 12:30 am      Procedure Time: 1:30 am     Location of Procedure:                    _x _  [facility] (4th Floor)                        PREPARATION FOR COLONOSCOPY WITH MOVIPREP   Starting 5 days prior to your procedure Friday 3/25 do not eat nuts, seeds, popcorn, corn, beans, peas,  salads, or any raw vegetables.  Do not take any fiber supplements (e.g. Metamucil, Citrucel, and Benefiber).  THE DAY BEFORE YOUR PROCEDURE         DATE: Tuesday 3/29  1.  Drink clear liquids the entire day-NO SOLID FOOD  2.  Do not drink anything colored red or purple.  Avoid juices with pulp.  No orange juice.  3.  Drink at least 64 oz. (8 glasses) of fluid/clear liquids during the day to prevent dehydration and help the prep work efficiently.  CLEAR LIQUIDS INCLUDE: Water Jello Ice Popsicles Tea (sugar ok, no milk/cream) Powdered fruit flavored drinks Coffee (sugar ok, no milk/cream) Gatorade Juice: apple, white grape, white cranberry  Lemonade Clear bullion, consomm, broth Carbonated beverages (any kind) Strained chicken noodle soup Hard Candy                             4.  In the morning, mix first dose of MoviPrep solution:    Empty 1 Pouch A and 1 Pouch B into the disposable container    Add lukewarm drinking water to the top line of the container. Mix to dissolve    Refrigerate (mixed solution should be used within 24 hrs)  5.  Begin drinking the prep at 5:00 p.m. The MoviPrep container is divided by 4 marks.   Every 15 minutes drink the solution down to the next mark (approximately 8 oz) until the full liter is complete.   6.  Follow completed prep with 16 oz of clear liquid of your choice  (Nothing red or purple).  Continue to drink clear liquids until bedtime.  7.  Before going to bed, mix second dose of MoviPrep solution:    Empty 1 Pouch A and 1 Pouch B into the disposable container    Add lukewarm drinking water to the top line of the container. Mix to dissolve    Refrigerate  THE DAY OF YOUR PROCEDURE      DATE: Wednesday 3/30  Beginning at 8:30 am  (5 hours before procedure):         1. Every 15 minutes, drink the solution down to the next mark (approx 8 oz) until the full liter is complete.  2. Follow completed prep with 16 oz. of clear liquid of your choice.    3. You may drink clear liquids until 11:30 am am  (2 HOURS BEFORE PROCEDURE).   MEDICATION INSTRUCTIONS  Unless otherwise instructed, you should take regular prescription medications with a small sip of water   as early as  possible the morning of your procedure.           OTHER INSTRUCTIONS  You will need a responsible adult at least 52 years of age to accompany you and drive you home.   This person must remain in the waiting room during your procedure.  Wear loose fitting clothing that is easily removed.  Leave jewelry and other valuables at home.  However, you may wish to bring a book to read or  an iPod/MP3 player to listen to music as you wait for your procedure to start.  Remove all body piercing jewelry and leave at home.  Total time from sign-in until discharge is approximately 2-3 hours.  You should go home directly after your procedure and rest.  You can resume normal activities the  day after your procedure.  The day of your procedure you should not:   Drive   Make legal decisions   Operate machinery   Drink alcohol   Return to work  You will receive specific instructions about eating, activities and medications before you leave.    The above instructions have been reviewed and explained to me by   Ezra Sites RN  March 17, 2009 4:42 PM     I fully  understand and can verbalize these instructions _____________________________ Date _________

## 2010-01-31 NOTE — Progress Notes (Signed)
Summary: refill request--clonazepam  Phone Note Refill Request Message from:  [facility] # [phone]  on October 03, 2009 3:15 PM  Refills Requested: Medication #1:  KLONOPIN 1 MG  TABS one by mouth once daily as needed [facility] from [facility] [place] requesting refill on Clonazepam.  Left message on pharmacy voicemail that request has been denied as pt was given written rx on 09/12/09 #90. Nicki Guadalajara Fergerson CMA Duncan Dull)  October 03, 2009 3:17 PM

## 2010-01-31 NOTE — Letter (Signed)
   [place] at [facility] [address] [place], [place]  [address]  [place] Phone: [phone]      September 13, 2009   [facility] [address] [place], [place] [address]  RE:  LAB RESULTS  Dear  Ms. Mcguiness,  The following is an interpretation of your most recent lab tests.  Please take note of any instructions provided or changes to medications that have resulted from your lab work.  ELECTROLYTES:  Good - no changes needed  KIDNEY FUNCTION TESTS:  Good - no changes needed  LIVER FUNCTION TESTS:  Good - no changes needed  LIPID PANEL:  Fair - review at your next visit Triglyceride: 153   Cholesterol: 212   LDL: 147   HDL: 34   Chol/HDL%:  6.2 Ratio        Sincerely Yours,    Dr. Thomos Lemons  Appended Document:  mailed

## 2010-01-31 NOTE — Progress Notes (Signed)
  Phone Note Outgoing Call   Summary of Call: call pt - blood test shows elevated white blood cell count.  I suggest referral to hematologist  Initial call taken by: [name] DO,  March 17, 2009 5:06 PM  Follow-up for Phone Call        pt. called wanting to know what her labs showed because she got a call from cancer center wanting to see her. I informed patient that she has a elevated white blood count and Dr.Tenessa Marsee wanted her to be seen by Hemotology. Follow-up by: Michaelle Copas,  March 18, 2009 11:49 AM  New Problems: LEUKOCYTOSIS (ICD-288.60)   New Problems: LEUKOCYTOSIS (ICD-288.60)

## 2010-01-31 NOTE — Progress Notes (Signed)
Summary: Pain Medication Refill  Phone Note Call from Patient Call back at Work Phone [phone]   Caller: Patient Call For: [name] DO Summary of Call: patient called and left voice message stating she was seen for back pain, she is now having pain in her shoulder blade  and down her arm for the past 5 days. She would like to know if Dr [name] would refill a [facility]cod[facility] [address] in [place] [phone] Initial call taken by: Glendell Docker CMA,  November 07, 2009 11:12 AM  Follow-up for Phone Call        pt asking for pain medication different than prev visit. we can not refill pain meds w/o OV Follow-up by: [name] DO,  November 07, 2009 4:21 PM  Additional Follow-up for Phone Call Additional follow up Details #1::        call returned to patient at [phone], she has been advised per Dr [name] instructions. She states she is still in [place] and will call back to schedule office visit Additional Follow-up by: Glendell Docker CMA,  November 07, 2009 4:32 PM

## 2010-01-31 NOTE — Progress Notes (Signed)
  Phone Note Call from Patient   Caller: Patient Details for Reason: Hematologist Summary of Call: Pt has been referral to Dr Myna Hidalgo, but cannot make her appointments, because of job and location.(lives in [place])  she wants to be referred to a Hematologist in [place] Initial call taken by: Darral Dash,  May 03, 2009 2:44 PM  Follow-up for Phone Call        please change referral to hematologist in [place] Follow-up by: [name] DO,  May 03, 2009 5:34 PM  Additional Follow-up for Phone Call Additional follow up Details #1::        Call place to  [place]  [phone]   Additional Follow-up by: Darral Dash,  May 10, 2009 2:26 PM    Additional Follow-up for Phone Call Additional follow up Details #2::    Appt  [facility] OncologHemotogy  [facility]  Dr Mayford Knife   May 24th  @   11am  Pt notified Follow-up by: Darral Dash,  May 17, 2009 2:37 PM

## 2010-01-31 NOTE — Progress Notes (Signed)
Summary: Back pain  Phone Note Call from Patient   Caller: Patient Details for Reason: Back Pain Summary of Call: Patient seen Monday Sept  12 for muscle pain.  having lower back pain keeping her up at night   . Medication Tramadol given is not helping  please call pt   [phone] Initial call taken by: Darral Dash,  September 16, 2009 11:11 AM  Follow-up for Phone Call        Pt advised rx was sent to pharmacy for Hydrocodone-acetaminophen and to stop Tramadol per Dr Olegario Messier instructions. Nicki Guadalajara Fergerson CMA Duncan Dull)  September 16, 2009 5:34 PM     New/Updated Medications: HYDROCODONE-ACETAMINOPHEN 5-500 MG TABS (HYDROCODONE-ACETAMINOPHEN) one by mouth two times a day as needed Prescriptions: HYDROCODONE-ACETAMINOPHEN 5-500 MG TABS (HYDROCODONE-ACETAMINOPHEN) one by mouth two times a day as needed  #30 x 0   Entered and Authorized by:   [name] DO   Signed by:   [name] DO on 09/16/2009   Method used:   Telephoned to ...       [facility] [place] [id number] (retail)       [address]       [place], [place]  [address]       Ph: [phone] or [phone]       Fax: [phone]   RxID:   [id number]

## 2010-01-31 NOTE — Progress Notes (Signed)
Summary: MRI Results  Phone Note Outgoing Call   Summary of Call: call pt - MRI of LS spine negative for signature spinal stenosis or nerve root compression  If acute symptoms are better,  I suggest referral to PT Initial call taken by: [name] DO,  October 07, 2009 3:32 PM  Follow-up for Phone Call        call placed to patient at [phone] she has been advised per Dr [name] instructions. Follow-up by: Glendell Docker CMA,  October 07, 2009 3:59 PM

## 2010-01-31 NOTE — Progress Notes (Signed)
Summary: Lab results  Phone Note Call from Patient Call back at Home Phone [phone]   Caller: Patient Reason for Call: Lab or Test Results Summary of Call: Pt called to see if we had lab results, advised pt results not avail yet but we will call when avail Initial call taken by: Lannette Donath,  July 08, 2009 2:35 PM  Follow-up for Phone Call        call pt - allergy panel was negative.    pt still may have allergies ( testing may be false negative) Follow-up by: [name] DO,  July 12, 2009 1:21 PM  Additional Follow-up for Phone Call Additional follow up Details #1::        patient advised per Dr [name] instructions Additional Follow-up by: Glendell Docker CMA,  July 12, 2009 1:52 PM

## 2010-01-31 NOTE — Assessment & Plan Note (Signed)
Summary: Follow Up/MHF   Vital Signs:  Patient profile:   50 year old female Height:      59 inches Weight:      118 pounds BMI:     23.92 O2 Sat:      97 % on Room air Temp:     97.4 degrees F oral Pulse rate:   94 / minute Pulse rhythm:   regular Resp:     16 per minute BP sitting:   106 / 70  (left arm) Cuff size:   regular  Vitals Entered By: Glendell Docker CMA (September 12, 2009 11:08 AM)  O2 Flow:  Room air CC: follow-up visit Is Patient Diabetic? No Pain Assessment Patient in pain? no      Comments refill on medications- printed for mail order , c/o daily muscle aches for the past 3-4 months   Primary Care Provider:  Dondra Spry DO  CC:  follow-up visit.  History of Present Illness:  Hyperlipidemia Follow-Up      This is a 50 year old woman who presents for Hyperlipidemia follow-up.  The patient reports muscle aches.  The patient denies the following symptoms: chest pain/pressure.  Compliance with medications (by patient report) has been near 100%.  Dietary compliance has been fair.  The patient reports no exercise.    htn - stable depression - stable  Preventive Screening-Counseling & Management  Alcohol-Tobacco     Smoking Status: current  Allergies: 1)  ! 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   Head:  normocephalic and atraumatic.   Neck:  No deformities, masses, or tenderness noted.no carotid bruits.   Lungs:  normal respiratory effort, normal breath sounds, and no wheezes.   Heart:  normal rate, regular rhythm, and no gallop.   Psych:  normally interactive, good eye contact, not anxious appearing, and not depressed appearing.     Impression & Recommendations:  Problem # 1:  HYPERLIPIDEMIA (ICD-272.4) pt c/o myalgia.  consider statin effect.  pt advised to hold x 1 week, then restart at lowe dose.  Her updated medication list for this problem includes:    Simvastatin 10 Mg Tabs (Simvastatin) ..... One by mouth qpm  Orders: T-Hepatic Function [phone]) T-Lipid Profile [phone]) T-CK Total [phone])  Labs Reviewed: SGOT: 13 (03/14/2009)   SGPT: 11 (03/14/2009)   HDL:31 (03/14/2009), 26.6 (01/01/2007)  LDL:56 (03/14/2009), 140 (32/95/1884)  Chol:115 (03/14/2009), 189 (01/01/2007)  Trig:141 (03/14/2009), 111 (01/01/2007)  Problem # 2:  HYPERTENSION (ICD-401.9)  Her updated medication list for this problem includes:    Losartan Potassium 100 Mg Tabs (Losartan potassium) ..... One by mouth once daily    Amlodipine Besylate 10 Mg Tabs (  Amlodipine besylate) ..... One by mouth once daily  Orders: T-Basic Metabolic Panel [id number])  BP today: 106/70 Prior BP: 134/78 (07/01/2009)  Labs Reviewed: K+: 4.1 (03/14/2009) Creat: : 0.79 (03/14/2009)   Chol: 115 (03/14/2009)   HDL: 31 (03/14/2009)   LDL: 56 (03/14/2009)   TG: 141 (03/14/2009)  Problem # 3:  DEPRESSION (ICD-311) Assessment: Unchanged  Her updated medication list for this problem includes:    Paroxetine Hcl 40 Mg Tabs (Paroxetine hcl) .Marland Kitchen... Take 1 tablet by mouth once a day    Klonopin 1 Mg Tabs (Clonazepam) ..... One by mouth once daily as needed  Complete  Medication List: 1)  Paroxetine Hcl 40 Mg Tabs (Paroxetine hcl) .... Take 1 tablet by mouth once a day 2)  Losartan Potassium 100 Mg Tabs (Losartan potassium) .... One by mouth once daily 3)  Ropinirole Hcl 2 Mg Tabs (Ropinirole hcl) .... One by mouth at bedtime prn 4)  Zolpidem Tartrate 10 Mg Tabs (Zolpidem tartrate) .... One by mouth at bedtime as needed 5)  Amlodipine Besylate 10 Mg Tabs (Amlodipine besylate) .... One by mouth once daily 6)  Klonopin 1 Mg Tabs (Clonazepam) .... One by mouth once daily as needed 7)  Simvastatin 10 Mg Tabs (Simvastatin) .... One by mouth qpm 8)  Fluticasone Propionate 50 Mcg/act Susp (Fluticasone propionate) .... 2 sprays each nostril once daily 9)  Azelastine Hcl 137 Mcg/spray Soln (Azelastine hcl) .... 2 sprays each nostril two times a day 10)  Hydrocodone-acetaminophen 5-500 Mg Tabs (Hydrocodone-acetaminophen) .... One by mouth two times a day as needed  Patient Instructions: 1)  Please schedule a follow-up appointment in 6 months. Prescriptions: KLONOPIN 1 MG  TABS (CLONAZEPAM) one by mouth once daily as needed  #90 x 0   Entered and Authorized by:   [name] DO   Signed by:   [name] DO on 09/12/2009   Method used:   Print then Give to Patient   RxID:   [id number] ROPINIROLE HCL 2 MG TABS (ROPINIROLE HCL) one by mouth at bedtime prn  #30 x 0   Entered and Authorized by:   [name] DO   Signed by:   [name] DO on 09/12/2009   Method used:   Print then Give to Patient   RxID:   [id number] TRAMADOL HCL 50 MG TABS (TRAMADOL HCL) one by mouth two times a day as needed  #30 x 0   Entered and Authorized by:   [name] DO   Signed by:   [name] DO on 09/12/2009   Method used:   Print then Give to Patient   RxID:   [id number] ROPINIROLE HCL 2 MG TABS (ROPINIROLE HCL) one by mouth at bedtime prn  #30 x 0   Entered and Authorized by:   [name] DO   Signed by:   [name] DO on 09/12/2009    Method used:   Electronically to        [id number]       [address]       [place], [place]  [address]       Ph: [phone] or [phone]       Fax: [phone]   RxID:   [id number] ZOLPIDEM TARTRATE 10 MG  TABS (ZOLPIDEM TARTRATE) one by mouth at bedtime as needed  #90 x 1   Entered and Authorized by:   [name] DO  Signed by:   [name] DO on 09/12/2009   Method used:   Print then Give to Patient   RxID:   [id number] AMLODIPINE BESYLATE 10 MG TABS (AMLODIPINE BESYLATE) one by mouth once daily  #90 x 1   Entered and Authorized by:   [name] DO   Signed by:   [name] DO on 09/12/2009   Method used:   Print then Give to Patient   RxID:   [id number] ROPINIROLE HCL 2 MG TABS (ROPINIROLE HCL) one by mouth at bedtime prn  #90 x 1   Entered and Authorized by:   [name] DO   Signed by:   [name] DO on 09/12/2009   Method used:   Print then Give to Patient   RxID:   [id number] PAROXETINE HCL 40 MG TABS (PAROXETINE HCL) Take 1 tablet by mouth once a day  #90 x 1   Entered and Authorized by:   [name] DO   Signed by:   [name] DO on 09/12/2009   Method used:   Print then Give to Patient   RxID:   [id number] LOSARTAN POTASSIUM 100 MG TABS (LOSARTAN POTASSIUM) one by mouth once daily  #90 x 1   Entered and Authorized by:   [name] DO   Signed by:   [name] DO on 09/12/2009   Method used:   Print then Give to Patient   RxID:   [id number] SIMVASTATIN 10 MG TABS (SIMVASTATIN) one by mouth qpm  #90 x 1   Entered and Authorized by:   [name] DO   Signed by:   [name] DO on 09/12/2009   Method used:   Electronically to        [id number] ([facility]       [address]       [place], [place]  [address]       Ph: [phone] or [phone]       Fax: [phone]   RxID:   [id number]    Immunization History:  Influenza Immunization  History:    Influenza:  declined (09/12/2009)   Contraindications/Deferment of Procedures/Staging:    Test/Procedure: FLU VAX    Reason for deferment: patient declined   Current Allergies (reviewed today): ! * DECONGESTANTS

## 2010-01-31 NOTE — Assessment & Plan Note (Signed)
Summary: med check/mhf   Vital Signs:  Patient profile:   50 year old female Height:      59 inches Weight:      111.75 pounds BMI:     22.65 O2 Sat:      99 % on Room air Temp:     97.9 degrees F oral Pulse rate:   78 / minute Pulse rhythm:   regular Resp:     18 per minute BP sitting:   130 / 80  (right arm) Cuff size:   regular  Vitals Entered By: Glendell Docker CMA (March 14, 2009 9:23 AM)  O2 Flow:  Room air CC: Rm-2 Follow up disease management   Primary Care Provider:  Dondra Spry DO  CC:  Rm-2 Follow up disease management.  History of Present Illness: 50 y/o white female with PMHx of htn, hyperlipidemia, tob use and depression for routine CPX overall doing well  no significant change in medical hx   she states neck gets reds whenever she is stressed or upset  Preventive Screening-Counseling & Management  Alcohol-Tobacco     Smoking Status: current  Allergies: 1)  ! 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  The patient denies fever, weight gain, chest pain, prolonged cough, abdominal pain, melena, hematochezia, severe indigestion/heartburn, and depression.    Physical  Exam  General:  alert, well-developed, and well-nourished.   Head:  normocephalic and atraumatic.   Eyes:  pupils equal, pupils round, and pupils reactive to light.   Ears:  R ear normal and L ear normal.   Mouth:  pharynx pink and moist.   Neck:  No deformities, masses, or tenderness noted.no carotid bruits.   Lungs:  normal respiratory effort, normal breath sounds, no crackles, and no wheezes.   Heart:  normal rate, regular rhythm, no murmur, and no gallop.   Abdomen:  soft, non-tender, normal bowel sounds, no hepatomegaly, and no splenomegaly.   Extremities:  No lower extremity edema  Neurologic:  cranial nerves II-XII intact and gait normal.   Psych:  normally interactive, good eye contact, not anxious appearing, and not depressed appearing.     Impression & Recommendations:  Problem # 1:  PREVENTIVE HEALTH CARE (ICD-V70.0) Reviewed adult health maintenance protocols. I urged tob cessation Orders: Mammogram (Screening) (Mammo) Gastroenterology Referral (GI)  Mammogram: Done (06/17/2000) Pap smear: normal (04/28/2008) Td Booster: Td (06/21/2003)   Flu Vax: Declined (03/14/2009)   Chol: 189 (01/01/2007)   HDL: 26.6 (01/01/2007)   LDL: 140 (01/01/2007)   TG: 111 (01/01/2007) TSH: 1.10 (01/01/2007)     Problem # 2:  HYPERTENSION (ICD-401.9) stable.  Maintain current medication regimen.  Her updated medication list for this problem  includes:    Lisinopril 40 Mg Tabs (Lisinopril) ..... One by mouth once daily    Amlodipine Besylate 10 Mg Tabs (Amlodipine besylate) ..... One by mouth once daily  Orders: T-Basic Metabolic Panel [id number])  BP today: 130/80 Prior BP: 142/70 (04/12/2008)  Labs Reviewed: K+: 3.6 (11/19/2007) Creat: : 0.8 (11/19/2007)   Chol: 189 (01/01/2007)   HDL: 26.6 (01/01/2007)   LDL: 140 (01/01/2007)   TG: 111 (01/01/2007)  Problem # 3:  HYPERLIPIDEMIA (ICD-272.4) She is not taking simivastatin regularly.  I encouraged compliance  Her updated  medication list for this problem includes:    Simvastatin 40 Mg Tabs (Simvastatin) ..... One by mouth qpm  Labs Reviewed: SGOT: 14 (01/01/2007)   SGPT: 13 (01/01/2007)   HDL:26.6 (01/01/2007)  LDL:140 (01/01/2007)  Chol:189 (01/01/2007)  Trig:111 (01/01/2007)  Problem # 4:  DEPRESSION (ICD-311) stable.  Maintain current medication regimen.  Her updated medication list for this problem includes:    Paroxetine Hcl 40 Mg Tabs (Paroxetine hcl) .Marland Kitchen... Take 1 tablet by mouth once a day    Klonopin 1 Mg Tabs (Clonazepam) ..... One by mouth once daily as needed  Complete Medication List: 1)  Paroxetine Hcl 40 Mg Tabs (Paroxetine hcl) .... Take 1 tablet by mouth once a day 2)  Lisinopril 40 Mg Tabs (Lisinopril) .... One by mouth once daily 3)  Ropinirole Hcl 2 Mg Tabs (Ropinirole hcl) .... One by mouth at bedtime prn 4)  Zolpidem Tartrate 10 Mg Tabs (Zolpidem tartrate) .... One by mouth at bedtime as needed 5)  Amlodipine Besylate 10 Mg Tabs (Amlodipine besylate) .... One by mouth once daily 6)  Klonopin 1 Mg Tabs (Clonazepam) .... One by mouth once daily as needed 7)  Simvastatin 40 Mg Tabs (Simvastatin) .... One by mouth qpm 8)  Nasonex 50 Mcg/act Susp (Mometasone furoate) .... 2 sprays each nostril qd 9)  Moviprep 100 Gm Solr (Peg-kcl-nacl-nasulf-na asc-c) .... As per prep instructions.  Other Orders: T-Hepatic Function [id number]) T-Lipid Profile [id number]) T-CBC No Diff ([id number]) T-TSH ([id number])  Patient Instructions: 1)  Please schedule a follow-up appointment in 6 months. Prescriptions: KLONOPIN 1 MG  TABS (CLONAZEPAM) one by mouth once daily as needed  #90 x 0   Entered and Authorized by:   [name] DO   Signed by:   [name] DO on 03/14/2009   Method used:   Print then Give to Patient   RxID:   [id number] ZOLPIDEM TARTRATE 10 MG  TABS (ZOLPIDEM TARTRATE) one by mouth at bedtime as needed  #90 x 1   Entered and Authorized by:   [name] DO    Signed by:   [name] DO on 03/14/2009   Method used:   Print then Give to Patient   RxID:   [id number] SIMVASTATIN 40 MG TABS (SIMVASTATIN) one by mouth qpm  #90 x 2   Entered by:   Glendell Docker CMA   Authorized by:   [name] DO   Signed by:   [name] DO on 03/14/2009   Method used:   Electronically to        [facility]* (retail)       [address]       [place], [place]         Ph: [phone]       Fax: [phone]   RxID:   [id number] AMLODIPINE BESYLATE 10 MG TABS (AMLODIPINE BESYLATE) one by mouth once daily  #90  x 2   Entered by:   Glendell Docker CMA   Authorized by:   [name] DO   Signed by:   [name] DO on 03/14/2009   Method used:   Electronically to        [facility]* (retail)       [address]       [place], [place]         Ph: [phone]       Fax: [phone]   RxID:   [id number] ROPINIROLE HCL 2 MG TABS (ROPINIROLE HCL) one by mouth at bedtime prn  #90 x 0   Entered by:   Glendell Docker CMA   Authorized by:   [name] DO   Signed by:   [name] DO on 03/14/2009   Method used:   Electronically to        [facility]* (retail)       [address]       [place], [place]         Ph: [phone]       Fax: [phone]   RxID:   [id number] LISINOPRIL 40 MG TABS (LISINOPRIL) one by mouth once daily  #90 x 3   Entered by:   Glendell Docker CMA   Authorized by:   [name] DO   Signed by:   [name] DO on 03/14/2009   Method used:   Electronically to        [facility]* (retail)       [address]       [place], [place]         Ph: [phone]       Fax: [phone]   RxID:   [id number] PAROXETINE HCL 40 MG TABS (PAROXETINE HCL) Take 1 tablet by mouth once a day  #90 x 2   Entered by:   Glendell Docker CMA   Authorized by:   [name] DO   Signed by:   [name] DO on 03/14/2009   Method used:   Electronically to          [facility]* (retail)       [address]       [place], [place]         Ph: [phone]       Fax: [phone]   RxID:   [id number]   Current Allergies (reviewed today): ! * DECONGESTANTS   Immunization History:  Influenza Immunization History:    Influenza:  declined (03/14/2009)   Contraindications/Deferment of Procedures/Staging:    Test/Procedure: FLU VAX    Reason for deferment: patient declined    Preventive Care Screening  Pap Smear:    Date:  04/28/2008    Results:  normal

## 2010-01-31 NOTE — Letter (Signed)
Summary: Previsit letter  [facility]  [address] [place], [place] [address]   Phone: [phone]  Fax: [phone]       03/14/2009 MRN: [id number]  [facility] [address] [place], [place]  [address]  Dear Ms. Harbach,  Welcome to the Gastroenterology Division at [facility].    You are scheduled to see a nurse for your pre-procedure visit on 03-17-09 at 4:00p.m. on the 3rd floor at [facility], [address]  We ask that you try to arrive at our office 15 minutes prior to your appointment time to allow for check-in.  Your nurse visit will consist of discussing your medical and surgical history, your immediate family medical history, and your medications.    Please bring a complete list of all your medications or, if you prefer, bring the medication bottles and we will list them.  We will need to be aware of both prescribed and over the counter drugs.  We will need to know exact dosage information as well.  If you are on blood thinners (Coumadin, Plavix, Aggrenox, Ticlid, etc.) please call our office today/prior to your appointment, as we need to consult with your physician about holding your medication.   Please be prepared to read and sign documents such as consent forms, a financial agreement, and acknowledgement forms.  If necessary, and with your consent, a friend or relative is welcome to sit-in on the nurse visit with you.  Please bring your insurance card so that we may make a copy of it.  If your insurance requires a referral to see a specialist, please bring your referral form from your primary care physician.  No co-pay is required for this nurse visit.     If you cannot keep your appointment, please call [phone] to cancel or reschedule prior to your appointment date.  This allows [place] the opportunity to schedule an appointment for another patient in need of care.    Thank you for choosing [facility] for your  medical needs.  We appreciate the opportunity to care for you.  Please visit [place] at our website  to learn more about our practice.                     Sincerely.                                                                                                                   The Gastroenterology Division

## 2010-01-31 NOTE — Assessment & Plan Note (Signed)
Summary: pain in her back/mhf   Vital Signs:  Patient profile:   50 year old female Height:      59 inches Weight:      119 pounds BMI:     24.12 O2 Sat:      98 % on Room air Temp:     97.9 degrees F oral Pulse rate:   74 / minute Pulse rhythm:   regular Resp:     16 per minute BP sitting:   132 / 70  (right arm) Cuff size:   regular  Vitals Entered By: Glendell Docker CMA (October 05, 2009 11:05 AM)  O2 Flow:  Room air  Contraindications/Deferment of Procedures/Staging:    Test/Procedure: FLU VAX    Reason for deferment: patient declined  CC: Back Pain Is Patient Diabetic? No Pain Assessment Patient in pain? yes     Location: back Intensity: 4 Type: burning Onset of pain  Constant Comments c/o burning sensation radiating bilaterally to sides with a soreness when touched ongoing for the past  month   Primary Care Provider:  Dondra Spry DO  CC:  Back Pain.  History of Present Illness: 50 y/o white female c/o low back pain worse with walking and laying on her side (left or right) pain radiating to right foot severity - 4 out of 10,  severity can be 10 out of 10 burning , stabbing sensation sensation no prev hx of back pain onset 2 months  no relief with tramadol hydrocodone takes the edge off  currently lives in apart with stairs she has difficulty getting up and down stairs pt thinking of temporarily living with her sister  no urinary complaints no fever  no prev hx of back injury  Preventive Screening-Counseling & Management  Alcohol-Tobacco     Smoking Status: current  Allergies: 1)  ! 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   Lungs:  normal respiratory effort, normal breath sounds, and no wheezes.   Heart:  normal rate, regular rhythm, and no gallop.   Extremities:  No lower extremity edema Neurologic:  cranial nerves II-XII intact.  hyperreflexia of patelar reflex - left > right   Impression & Recommendations:  Problem # 1:  LOW BACK PAIN SYNDROME, SEVERE (ICD-724.2) 50 y/o female with symptoms of lumbar radiculopathy. obtain MRI of LS spine trial of prednisone  Her updated medication list for this problem includes:    Hydrocodone-acetaminophen 5-500 Mg Tabs (Hydrocodone-acetaminophen) ..... One by mouth two times a day as needed  Orders: MRI without Contrast (MRI w/o Contrast)  Complete Medication List: 1)  Paroxetine Hcl 40 Mg Tabs (Paroxetine hcl) .... Take 1 tablet by mouth once a day 2)  Losartan Potassium 100 Mg Tabs (Losartan potassium) .... One by mouth once daily 3)  Ropinirole Hcl 2 Mg Tabs (Ropinirole hcl) .... One by mouth at bedtime prn 4)  Zolpidem Tartrate 10 Mg Tabs (Zolpidem tartrate) .... One by mouth at bedtime as needed 5)  Amlodipine Besylate 10 Mg  Tabs (Amlodipine besylate) .... One by mouth once daily 6)  Klonopin 1 Mg Tabs (Clonazepam) .... One by mouth once daily as needed 7)  Simvastatin 10 Mg Tabs (Simvastatin) .... One by mouth qpm 8)  Fluticasone Propionate 50 Mcg/act Susp (Fluticasone propionate) .... 2 sprays each nostril once daily 9)  Azelastine Hcl 137 Mcg/spray Soln (Azelastine hcl) .... 2 sprays each nostril two times a day 10)  Hydrocodone-acetaminophen 5-500 Mg Tabs (Hydrocodone-acetaminophen) .... One by mouth two times a day as needed 11)  Lyrica 50 Mg Caps (Pregabalin) .... One by mouth three times a day as directed 12)  Prednisone 10 Mg Tabs (Prednisone) .... 3 tabs by mouth  once daily x 3 days, 2 tabs by mouth once daily x 3 days, 1 tab by mouth once daily x 3 days  Patient Instructions: 1)  Please schedule a follow-up appointment in 2 weeks. Prescriptions: PREDNISONE 10 MG TABS (PREDNISONE) 3 tabs by mouth once daily x 3 days, 2 tabs by mouth once daily x 3 days, 1 tab by mouth once daily x 3 days  #18 x 0   Entered and Authorized by:   [name] DO   Signed by:   [name] DO on 10/05/2009   Method used:   Print then Give to Patient   RxID:   [id number] HYDROCODONE-ACETAMINOPHEN 5-500 MG TABS (HYDROCODONE-ACETAMINOPHEN) one by mouth two times a day as needed  #30 x 0   Entered and Authorized by:   [name] DO   Signed by:   [name] DO on 10/05/2009   Method used:   Print then Give to Patient   RxID:   [id number] LYRICA 50 MG CAPS (PREGABALIN) one by mouth three times a day as directed  #90 x 0   Entered and Authorized by:   [name] DO   Signed by:   [name] DO on 10/05/2009   Method used:   Print then Give to Patient   RxID:   [id number]   Current Allergies (reviewed today): ! * DECONGESTANTS

## 2010-01-31 NOTE — Assessment & Plan Note (Signed)
Summary: congestion comes & go since March/dt--rm 2   Vital Signs:  Patient profile:   50 year old female Height:      59 inches Weight:      112.50 pounds BMI:     22.80 O2 Sat:      97 % on Room air Temp:     98.0 degrees F oral Pulse rate:   84 / minute Pulse rhythm:   regular Resp:     14 per minute BP sitting:   134 / 78  (right arm) Cuff size:   child  Vitals Entered By: Mervin Kung CMA Duncan Dull) (July 01, 2009 3:25 PM)  O2 Flow:  Room air CC: Room 2  Pt states she has had intermittent sinus and allergy symptoms since March, now having pain on right side of back when she breathes. Is Patient Diabetic? No Comments Pt states she no longer uses Nasonex as it didn't seem to help. Nicki Guadalajara Fergerson CMA Duncan Dull)  July 01, 2009 3:31 PM    Primary Care Provider:  Dondra Spry DO  CC:  Room 2  Pt states she has had intermittent sinus and allergy symptoms since March and now having pain on right side of back when she breathes..  History of Present Illness:       This is a 50 year old female who presents with allergic rhinitis.  The patient complains of nasal congestion, post nasal drip, and sore throat.  Patient states symptoms are worse with exposure to outdoors.  Previous effective treatment includes cetirizine.  Ineffective treatment includes mometasone.  symptoms ongoing since March  c.o chronic dry cough.  worse at night   Leukocytosis - eval by heme.    Allergies: 1)  ! 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    Physical Exam  General:  alert, well-developed, and well-nourished.   Head:  normocephalic and atraumatic.   Ears:  R ear normal and L ear normal.   Mouth:  pharynx pink and moist and postnasal drip.   Lungs:  normal respiratory effort and normal breath sounds.   Heart:  normal rate, regular rhythm, and no gallop.   Abdomen:  soft, non-tender, and normal bowel sounds.   Extremities:  No lower extremity edema  Psych:  normally interactive, good eye contact, not anxious appearing, and not depressed appearing.     Impression & Recommendations:  Problem # 1:  ALLERGIC RHINITIS (ICD-477.9) chronic allergic rhinitis.  no improvement with zyrtec and nasonex  Her updated medication list for this problem includes:    Fluticasone Propionate 50 Mcg/act Susp (Fluticasone propionate) .Marland Kitchen... 2 sprays each nostril once daily    Azelastine Hcl 137 Mcg/spray Soln (  Azelastine hcl) .Marland Kitchen... 2 sprays each nostril two times a day  Orders: T- * Misc. Laboratory test [phone])  Problem # 2:  COUGH (ICD-786.2) she notes dry cough.  right upper thoracic back pain with cough.  check cxr  no acute changes.  DC ACE Orders: T-2 View CXR, Same Day ([id number])  Problem # 3:  HYPERTENSION (ICD-401.9) change to ARB  Her updated medication list for this problem includes:    Losartan Potassium 100 Mg Tabs (Losartan potassium) ..... One by mouth once daily    Amlodipine Besylate 10 Mg Tabs (Amlodipine besylate) ..... One by mouth once daily  Complete Medication List: 1)  Paroxetine Hcl 40 Mg Tabs (Paroxetine hcl) .... Take 1 tablet by mouth once a day 2)  Losartan Potassium 100 Mg Tabs  (Losartan potassium) .... One by mouth once daily 3)  Ropinirole Hcl 2 Mg Tabs (Ropinirole hcl) .... One by mouth at bedtime prn 4)  Zolpidem Tartrate 10 Mg Tabs (Zolpidem tartrate) .... One by mouth at bedtime as needed 5)  Amlodipine Besylate 10 Mg Tabs (Amlodipine besylate) .... One by mouth once daily 6)  Klonopin 1 Mg Tabs (Clonazepam) .... One by mouth once daily as needed 7)  Simvastatin 40 Mg Tabs (Simvastatin) .... One by mouth qpm 8)  Fluticasone Propionate 50 Mcg/act Susp (Fluticasone propionate) .... 2 sprays each nostril once daily 9)  Prednisone 20 Mg Tabs (Prednisone) .... One by mouth two times a day x 4 days, then 1/2 by mouth two times a day x 4 days, then 1/2 by mouth once daily x 4 days 10)  Azelastine Hcl 137 Mcg/spray Soln (Azelastine hcl) .... 2 sprays each nostril two times a day  Patient Instructions: 1)  Stop lisinopril 2)  The highlighted prescriptions were electronically sent to your pharmacy 3)  Please schedule a follow-up appointment in 1 month Prescriptions: AZELASTINE HCL 137 MCG/SPRAY SOLN (AZELASTINE HCL) 2 sprays each nostril two times a day  #1 x 5   Entered and Authorized by:   [name] DO   Signed by:   [name] DO on 07/01/2009   Method used:   Print then Give to Patient   RxID:   [id number] PREDNISONE 20 MG TABS (PREDNISONE) one by mouth two times a day x 4 days, then 1/2 by mouth two times a day x 4 days, then 1/2 by mouth once daily x 4 days  #15 x 0   Entered and Authorized by:   [name] DO   Signed by:   [name] DO on 07/01/2009   Method used:   Print then Give to Patient   RxID:   [id number] FLUTICASONE PROPIONATE 50 MCG/ACT SUSP (FLUTICASONE PROPIONATE) 2 sprays each nostril once daily  #1 x 5   Entered and Authorized by:   [name] DO   Signed by:   [name] DO on 07/01/2009   Method used:   Electronically to        [facility]* (retail)       [address]       [place], [place]          Ph: [phone]       Fax: [phone]   RxID:   [id number] LOSARTAN POTASSIUM 100 MG TABS (LOSARTAN POTASSIUM) one by mouth once daily  #90 x 3   Entered and Authorized by:   [name] DO   Signed by:   [name]  DO on 07/01/2009   Method used:   Electronically to        [facility]* (retail)       [address]       [place], [place]         Ph: [phone]       Fax: [phone]   RxID:   [id number]   Current Allergies (reviewed today): ! * DECONGESTANTS

## 2010-01-31 NOTE — Letter (Signed)
Summary: Patient Will Contact Office @ Later Date/[facility]   Patient Will Contact Office @ Later Date/[facility]   Imported By: Lanelle Bal 11/03/2009 10:17:50  _____________________________________________________________________  External Attachment:    Type:   Image     Comment:   External Document

## 2010-01-31 NOTE — Progress Notes (Signed)
Summary: Rx    Phone Note Other Incoming   Summary of Call: ---- Converted from flag ---- ---- 09/19/2009 12:54 PM, Darral Dash wrote: Pt called said rx was not rec by pharmacy,  call to pharmacy  they said they  did not get rx, ------------------------------  Spoke to [place] at [facility], she states they did not get voicemail of Rx on Friday. Gave verbal per order and notified pt to check with pharmacy soon.  Follow-up for Phone Call        pt was given paper rx Follow-up by: [name] DO,  September 19, 2009 5:32 PM  Additional Follow-up for Phone Call Additional follow up Details #1::        Verbal was given on Simvastatin rx that looked like it was sent electronically. Nicki Guadalajara Fergerson CMA Duncan Dull)  September 20, 2009 1:01 PM

## 2010-02-01 ENCOUNTER — Telehealth: Payer: Self-pay | Admitting: Internal Medicine

## 2010-02-02 NOTE — Progress Notes (Signed)
Summary: Philis Nettle Refill  Phone Note Refill Request Message from:  Fax from Pharmacy on December 12, 2009 8:15 AM  Refills Requested: Medication #1:  LYRICA 50 MG CAPS one by mouth three times a day as directed   Dosage confirmed as above?Dosage Confirmed   Brand Name Necessary? No   Supply Requested: 1 month   Last Refilled: 10/21/2009  Method Requested: Electronic Next Appointment Scheduled: 03-13-10 Dr [name]  Initial call taken by: Roselle Locus,  December 12, 2009 8:16 AM  Follow-up for Phone Call        ok to refill x 3 Follow-up by: [name] DO,  December 12, 2009 12:00 PM  Additional Follow-up for Phone Call Additional follow up Details #1::        Rx called to pharmacy Additional Follow-up by: Glendell Docker CMA,  December 12, 2009 1:34 PM    Prescriptions: LYRICA 50 MG CAPS (PREGABALIN) one by mouth three times a day as directed  #30 x 3   Entered by:   Glendell Docker CMA   Authorized by:   [name] DO   Signed by:   Glendell Docker CMA on 12/12/2009   Method used:   Telephoned to ...       [id number] ([facility]ail[facility]       [address]       [place], [place]  [address]       Ph: [phone] or [phone]       Fax: [phone]   RxID:   [id number]   Appended Document: Philis Nettle Refill pharmacist call back to clarify patient instructions, ammended to quantity of 90 with 3 refills

## 2010-02-08 NOTE — Progress Notes (Signed)
Summary: Questions about Ortho referral  Phone Note Call from Patient Call back at Home Phone [phone]   Caller: Patient Call For: [name] DO Summary of Call: patient called and left voice message stating she was called regarding ortho referral yesterday. Her message states she is not sure why she is being referred to ortho for evaluation. She also states that she had 2 disc replaced in her c-spine and 2 in her neck a couple of years ago. She would like to know if the pain she is having,could it be related to the replaced disc. Initial call taken by: Glendell Docker CMA,  February 01, 2010 9:10 AM  Follow-up for Phone Call        it is possible but orthopedic physician or prev neurosurgeon who performed the surgery would have to determine whether pain was coming from previous neck surgery  if pts pain is most problematic in upper back or neck, I suggest pt follow up with her prev neurosurgeon.  if pt having pt more in low back and hip - I suggest ortho referral   Follow-up by: [name] DO,  February 01, 2010 11:28 AM  Additional Follow-up for Phone Call Additional follow up Details #1::        call placed to patient at [phone], no answer. A detalied voice message was left informing patient per Dr [name] instructions. Message was left for patient to return call if any questions. Additional Follow-up by: Glendell Docker CMA,  February 01, 2010 4:49 PM

## 2010-02-08 NOTE — Progress Notes (Signed)
Summary: Back Pain, requests refill  Phone Note Outgoing Call   Summary of Call: call pt - bone scan is normal Initial call taken by: [name] DO,  January 27, 2010 4:46 PM  Follow-up for Phone Call        call placed to patient at [phone], she has been informed per Dr [name] instructions.   Patient would like to know where does she go from here, and she has had lower back pain since Thursday  Follow-up by: Glendell Docker CMA,  January 30, 2010 8:53 AM  Additional Follow-up for Phone Call Additional follow up Details #1::        I suggest referral to ortho Additional Follow-up by: [name] DO,  January 30, 2010 1:50 PM    Additional Follow-up for Phone Call Additional follow up Details #2::    Pt advised. States she is out of pain medication and would like a refill of Oxycodone. Please advise. Nicki Guadalajara Fergerson CMA Duncan Dull)  January 30, 2010 2:44 PM   Additional Follow-up for Phone Call Additional follow up Details #3:: Details for Additional Follow-up Action Taken: refill denied I suggest she try taking otc ibuprofen 400 mg two times a day as needed with food and plenty of water. Do not take above for more than 5 days.  Dondra Spry DO,  January 31, 2010 1:26 PM  Pt notified per Dr Olegario Messier instruction and voices understanding. Nicki Guadalajara Fergerson CMA Duncan Dull)  January 31, 2010 3:03 PM

## 2010-02-08 NOTE — Assessment & Plan Note (Signed)
Summary: hip pain/mhf   Vital Signs:  Patient profile:   50 year old female Height:      59 inches Weight:      114.25 pounds BMI:     23.16 O2 Sat:      100 % on Room air Temp:     97.7 degrees F oral Pulse rate:   85 / minute Resp:     18 per minute BP sitting:   150 / 90  (left arm) Cuff size:   regular  Vitals Entered By: Glendell Docker CMA (January 18, 2010 10:18 AM)  O2 Flow:  Room air CC: Hip Pain Is Patient Diabetic? No Pain Assessment Patient in pain? yes     Location: hip Intensity: 10 Type: sharp Onset of pain  Constant Comments c/o left hip pain for the past  2 weeks, with shooting pain down leg ,and lower back   Primary Care Provider:  Dondra Spry DO  CC:  Hip Pain.  History of Present Illness:  pt having intermittent pain seems to migrate - middle of back (in between shoulder blades),  lumbar spine, and left hip no specific trigger  sitting, standing and walking makes in worse sharp stabbing pain feels like its in the bone  no fever    MRI of LS spine 10/2009  No significant spinal stenosis, neural foraminal narrowing or nerve root compression.   Probable reactive endplate changes anterior superior aspect of the L3 vertebra as noted above.   Decreased signal of intensity bone marrow.  Please see above discussion.     Preventive Screening-Counseling & Management  Alcohol-Tobacco     Smoking Status: current  Allergies: 1)  ! 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   Lungs:  normal respiratory effort and normal breath sounds.   Heart:  normal rate, regular rhythm, and no gallop.   Neurologic:  cranial nerves II-XII intact, gait normal, and DTRs  symmetrical and normal.   Skin:  no rash   Impression & Recommendations:  Problem # 1:  HIP PAIN, LEFT (ICD-719.45) intermittent hip and back pain of unclear etiology she complains of " bone pain "  prev MRI - no acute findings obtain bone scan use oxycodone as needed  consider ortho referral The following medications were removed from the medication list:    Hydrocodone-acetaminophen 5-500 Mg Tabs (Hydrocodone-acetaminophen) ..... One by mouth two times a day as needed Her updated medication list for this problem includes:    Oxycodone-acetaminophen 5-325 Mg Tabs (Oxycodone-acetaminophen) ..... One by mouth two times a day prn  Orders: T-Hip Comp Left Min 2-views (73510TC) Radiology Referral (Radiology)  Complete Medication List: 1)  Paroxetine Hcl 40 Mg Tabs (Paroxetine hcl) .... Take 1 tablet by mouth once a day 2)  Losartan Potassium 100 Mg Tabs (Losartan potassium) .... One by mouth once daily 3)  Ropinirole Hcl 2 Mg Tabs (Ropinirole hcl) .... One by mouth at bedtime prn 4)  Zolpidem Tartrate 10 Mg Tabs (Zolpidem tartrate) .... One by mouth at bedtime as needed 5)  Amlodipine Besylate 10 Mg Tabs (Amlodipine besylate) .... One by mouth once daily 6)  Klonopin 1 Mg Tabs (Clonazepam) .... One by mouth once daily as needed 7)  Simvastatin 10 Mg Tabs (Simvastatin) .... One by mouth qpm 8)  Fluticasone Propionate 50 Mcg/act Susp (Fluticasone propionate) .... 2 sprays each nostril once daily 9)  Azelastine Hcl 137 Mcg/spray Soln (Azelastine hcl) .... 2 sprays each nostril two times a day 10)  Oxycodone-acetaminophen 5-325 Mg Tabs (Oxycodone-acetaminophen) .... One by mouth two times a day prn  Patient Instructions: 1)  Please schedule a follow-up appointment in 1 month. Prescriptions: OXYCODONE-ACETAMINOPHEN 5-325 MG TABS (OXYCODONE-ACETAMINOPHEN) one by mouth two times a day prn  #30 x 0   Entered and Authorized by:   [name] DO   Signed by:   [name] DO on  01/18/2010   Method used:   Print then Give to Patient   RxID:   [id number]    Orders Added: 1)  T-Hip Comp Left Min 2-views [73510TC] 2)  Radiology Referral [Radiology] 3)  Est. Patient Level III [14782]    Current Allergies (reviewed today): ! * DECONGESTANTS

## 2010-03-09 ENCOUNTER — Encounter: Payer: Self-pay | Admitting: Internal Medicine

## 2010-03-09 ENCOUNTER — Ambulatory Visit (INDEPENDENT_AMBULATORY_CARE_PROVIDER_SITE_OTHER): Payer: Managed Care, Other (non HMO) | Admitting: Internal Medicine

## 2010-03-09 DIAGNOSIS — R05 Cough: Secondary | ICD-10-CM

## 2010-03-09 DIAGNOSIS — R059 Cough, unspecified: Secondary | ICD-10-CM

## 2010-03-09 DIAGNOSIS — J209 Acute bronchitis, unspecified: Secondary | ICD-10-CM

## 2010-03-13 ENCOUNTER — Ambulatory Visit: Payer: Self-pay | Admitting: Internal Medicine

## 2010-03-15 ENCOUNTER — Telehealth: Payer: Self-pay | Admitting: Internal Medicine

## 2010-03-21 NOTE — Progress Notes (Signed)
Summary: Status Update  Phone Note Call from Patient Call back at [phone]   Caller: Patient Call For: [name] DO Summary of Call: patient called and left voice message stating her symptoms have not imroved, she has constant drainage into her throat when she lays down, she states causing  severe cough, she c/o neck pain.  She would like to know if there is something else that could be prescribed for her.  Initial call taken by: Glendell Docker CMA,  March 15, 2010 4:31 PM  Follow-up for Phone Call        I suggest CXR then f/u OV Follow-up by: [name] DO,  March 15, 2010 8:48 PM  Additional Follow-up for Phone Call Additional follow up Details #1::        call placed patient at [phone], no answer. A detailed voice message was left informing patient per Dr [name] instructions. Message left for patient to schdeule follow up with Dr [name] & have CXR done Additional Follow-up by: Glendell Docker CMA,  March 16, 2010 8:17 AM

## 2010-03-24 ENCOUNTER — Telehealth: Payer: Self-pay | Admitting: Internal Medicine

## 2010-03-24 DIAGNOSIS — F329 Major depressive disorder, single episode, unspecified: Secondary | ICD-10-CM

## 2010-03-24 DIAGNOSIS — F3289 Other specified depressive episodes: Secondary | ICD-10-CM

## 2010-03-24 NOTE — Telephone Encounter (Signed)
Opened in error

## 2010-03-24 NOTE — Telephone Encounter (Signed)
Clonazepam. qty 1mg . 1 po qd prn.

## 2010-03-27 MED ORDER — CLONAZEPAM 1 MG PO TABS: 1.0000 mg | ORAL_TABLET | Freq: Every day | ORAL | Status: DC | PRN

## 2010-03-27 NOTE — Telephone Encounter (Signed)
Prescription called to pharmacy to [facility].

## 2010-03-27 NOTE — Telephone Encounter (Signed)
Ok to refill x 3 

## 2010-03-30 NOTE — Assessment & Plan Note (Signed)
Summary: sick and coughing/ss   Vital Signs:  Patient profile:   50 year old female Height:      59 inches Weight:      111.50 pounds BMI:     22.60 O2 Sat:      99 % on Room air Temp:     97.4 degrees F oral Pulse rate:   76 / minute Resp:     18 per minute BP sitting:   162 / 100  (right arm) Cuff size:   regular  Vitals Entered By: Glendell Docker CMA (March 09, 2010 11:36 AM)  O2 Flow:  Room air CC: URI Is Patient Diabetic? No Pain Assessment Patient in pain? no        Primary Care Provider:  Dondra Spry DO  CC:  URI.  History of Present Illness: 75 yc/o non productive cough for the past 2 weeks with chest discomfort from cough, taken Mucinex, alka seltzer, iburpofen with no relief, nasal drainage clear in color  no fever SOB - hurts to breath  Preventive Screening-Counseling & Management  Alcohol-Tobacco     Smoking Status: current     Smoking Cessation Counseling: yes  Allergies: 1)  ! 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   Lungs:  normal respiratory effort and coarse breath sounds bilaterally Heart:  normal rate, regular rhythm, and no gallop.   Extremities:   No lower extremity edema  Neurologic:  cranial nerves II-XII intact and gait normal.   Psych:  normally interactive, good eye contact, not anxious appearing, and not depressed appearing.     Impression & Recommendations:  Problem # 1:  ACUTE BRONCHITIS (ICD-466.0) Assessment Deteriorated  Her updated medication list for this problem includes:    Avelox 400 Mg Tabs (Moxifloxacin hcl) ..... One by mouth once daily  Orders: Admin of Therapeutic Inj  intramuscular or subcutaneous ([address])  Take antibiotics and other medications as directed. Encouraged to push clear liquids, get enough rest, and take acetaminophen as needed. To be seen in 5-7 days if no improvement, sooner if worse.  Complete Medication List: 1)  Paroxetine Hcl 40 Mg Tabs (Paroxetine hcl) .... Take 1 tablet by mouth once a day 2)  Losartan Potassium 100 Mg Tabs (Losartan potassium) .... One by mouth once daily 3)  Ropinirole Hcl 2 Mg Tabs (Ropinirole hcl) .... One by mouth at bedtime prn 4)  Zolpidem Tartrate 10 Mg Tabs (Zolpidem tartrate) .... One by mouth at bedtime as needed 5)  Amlodipine Besylate 10 Mg Tabs (Amlodipine besylate) .... One by mouth once daily 6)  Klonopin 1 Mg Tabs (Clonazepam) .... One by mouth  once daily as needed 7)  Simvastatin 10 Mg Tabs (Simvastatin) .... One by mouth qpm 8)  Fluticasone Propionate 50 Mcg/act Susp (Fluticasone propionate) .... 2 sprays each nostril once daily 9)  Azelastine Hcl 137 Mcg/spray Soln (Azelastine hcl) .... 2 sprays each nostril two times a day 10)  Avelox 400 Mg Tabs (Moxifloxacin hcl) .... One by mouth once daily 11)  Prednisone 20 Mg Tabs (Prednisone) .... One by mouth two times a day x 4 days, 1/2 tab by mouth two times a day x 4 days,  then 1/2 by mouth once daily x 4 days  Other Orders: Nebulizer Tx (29528) Solumedrol up to 125mg  (U1324)  Patient Instructions: 1)  Please schedule a follow-up appointment in 1 month. 2)  Please try to quit smoking 3)  Call our  office if your symptoms do not  improve or gets worse. Prescriptions: PREDNISONE 20 MG TABS (PREDNISONE) one by mouth two times a day x 4 days, 1/2 tab by mouth two times a day x 4 days,  then 1/2 by mouth once daily x 4 days  #15 x 0   Entered and Authorized by:   [name] DO   Signed by:   [name] DO on 03/09/2010   Method used:   [facility]        [facility]  [address] [id number]       [address]       [place], [place]  [address]       Ph: [phone]       Fax: [phone]   RxID:   [id number] AVELOX 400 MG TABS (MOXIFLOXACIN HCL) one by mouth once daily  #10 x 0   Entered and Authorized by:   [name] DO   Signed by:   [name] DO on 03/09/2010   Method used:   Electronically to        [facility]  [address] [id number]       [address]       [place], [place]  [address]       Ph: [phone]       Fax: [phone]   RxID:   [id number]    Medication Administration  Injection # 1:    Medication: Solumedrol up to 125mg     Diagnosis: ACUTE BRONCHITIS (ICD-466.0)    Route: IM    Site: LUOQ gluteus    Exp Date: 07/01/2010    Lot #: [id number]    Mfr: PFIZER    Patient tolerated injection without complications    Given by: Glendell Docker CMA (March 09, 2010 12:09 PM)  Medication # 1:    Medication: Albuterol Sulfate Sol 1mg  unit dose    Diagnosis: ACUTE BRONCHITIS (ICD-466.0)    Dose: 2.5MG     Route: inhaled    Exp Date: 10/01/2011    Lot #: [id number]    Mfr: NEPHRON    Patient tolerated medication without complications    Given by: Glendell Docker CMA (March 09, 2010 12:08 PM)  Orders Added: 1)  Nebulizer Tx [62703] 2)  Solumedrol up to 125mg  [J2930] 3)  Admin of Therapeutic Inj  intramuscular or subcutaneous [96372] 4)  Est. Patient Level III [50093]    Current Allergies (reviewed today): ! * DECONGESTANTS   Medication Administration  Injection # 1:    Medication:  Solumedrol up  to 125mg     Diagnosis: ACUTE BRONCHITIS (ICD-466.0)    Route: IM    Site: LUOQ gluteus    Exp Date: 07/01/2010    Lot #: [id number]    Mfr: PFIZER    Patient tolerated injection without complications    Given by: Glendell Docker CMA (March 09, 2010 12:09 PM)  Medication # 1:    Medication: Albuterol Sulfate Sol 1mg  unit dose    Diagnosis: ACUTE BRONCHITIS (ICD-466.0)    Dose: 2.5MG     Route: inhaled    Exp Date: 10/01/2011    Lot #: [id number]    Mfr: NEPHRON    Patient tolerated medication without complications    Given by: Glendell Docker CMA (March 09, 2010 12:08 PM)  Orders Added: 1)  Nebulizer Tx [04540] 2)  Solumedrol up to 125mg  [J2930] 3)  Admin of Therapeutic Inj  intramuscular or subcutaneous [96372] 4)  Est. Patient Level III [98119]

## 2010-04-11 LAB — PREGNANCY, URINE: Preg Test, Ur: NEGATIVE

## 2010-04-11 LAB — BASIC METABOLIC PANEL
Calcium: 9.4 mg/dL (ref 8.4–10.5)
GFR calc Af Amer: >60 mL/min (ref >60)
GFR calc non Af Amer: >60 mL/min (ref >60)
Glucose, Bld: 100 mg/dL — ABNORMAL HIGH (ref 70–99)
Potassium: 4.6 mEq/L (ref 3.5–5.1)
Sodium: 133 mEq/L — ABNORMAL LOW (ref 135–145)

## 2010-04-11 LAB — CBC
Hemoglobin: 12.5 g/dL (ref 12.0–15.0)
MCHC: 33.8 g/dL (ref 30.0–36.0)
RBC: 4.33 MIL/uL (ref 3.87–5.11)
RDW: 28.8 % — ABNORMAL HIGH (ref 11.5–15.5)

## 2010-04-12 LAB — CROSSMATCH: Antibody Screen: NEGATIVE

## 2010-04-12 LAB — ABO/RH: ABO/RH(D): A NEG

## 2010-04-19 ENCOUNTER — Telehealth: Payer: Self-pay | Admitting: Internal Medicine

## 2010-04-19 NOTE — Telephone Encounter (Signed)
Request for a refill or new prescription  Paroxetine hcl 40mg  tablet. Qty 90.0 EA. Take 1 tablet by mouth every day. Last fill 2.10.12.

## 2010-04-20 ENCOUNTER — Encounter: Payer: Self-pay | Admitting: Internal Medicine

## 2010-04-20 MED ORDER — PAROXETINE HCL 40 MG PO TABS: 40.0000 mg | ORAL_TABLET | ORAL | Status: DC

## 2010-04-21 ENCOUNTER — Encounter: Payer: Managed Care, Other (non HMO) | Admitting: Internal Medicine

## 2010-04-21 ENCOUNTER — Ambulatory Visit: Payer: Managed Care, Other (non HMO) | Admitting: Internal Medicine

## 2010-04-21 ENCOUNTER — Encounter: Payer: Self-pay | Admitting: Internal Medicine

## 2010-04-21 ENCOUNTER — Ambulatory Visit (INDEPENDENT_AMBULATORY_CARE_PROVIDER_SITE_OTHER): Payer: Managed Care, Other (non HMO) | Admitting: Internal Medicine

## 2010-04-21 VITALS — BP 150/80 | HR 91 | Temp 98.0°F | Resp 10 | Wt 111.0 lb

## 2010-04-21 DIAGNOSIS — IMO0001 Reserved for inherently not codable concepts without codable children: Secondary | ICD-10-CM

## 2010-04-21 DIAGNOSIS — I10 Essential (primary) hypertension: Secondary | ICD-10-CM

## 2010-04-21 DIAGNOSIS — M25559 Pain in unspecified hip: Secondary | ICD-10-CM

## 2010-04-21 DIAGNOSIS — J449 Chronic obstructive pulmonary disease, unspecified: Secondary | ICD-10-CM

## 2010-04-21 MED ORDER — VALSARTAN 320 MG PO TABS: 320.0000 mg | ORAL_TABLET | Freq: Every day | ORAL | Status: DC

## 2010-04-21 MED ORDER — BUDESONIDE-FORMOTEROL FUMARATE 160-4.5 MCG/ACT IN AERO: 2.0000 | INHALATION_SPRAY | Freq: Two times a day (BID) | RESPIRATORY_TRACT | Status: DC

## 2010-04-21 NOTE — Patient Instructions (Addendum)
Our office will contact you re:  Orthopedic referral Please complete the following lab tests before your next follow up appointment: BMET - 401.9

## 2010-05-09 NOTE — Assessment & Plan Note (Signed)
Pt with persistent left hip pain of unclear etiology. MRI of lumbosacral spine was negative for any potential cause.   Bone scan and left hip x-ray were negative Question bursitis.  Refer to orthopedic specialist for further evaluation and treatment

## 2010-05-09 NOTE — Assessment & Plan Note (Signed)
Continue maintenance inhaler Smoking cessation strongly encouraged

## 2010-05-09 NOTE — Progress Notes (Signed)
Subjective:    Patient ID: [name] [name], female    DOB: 21-Mar-1960, 50 y.o.   MRN: [id number]  HPI   50 y/o female for follow up.  Pt continues to struggles with left hip pain.  On prev visit, we questioned whether hip pain was radiating from LS spine.  MRI of LS spine in Oct 2011 showed - No significant spinal stenosis, neural foraminal narrowing or nerveroot compression.  Probable reactive endplate changes anterior superior aspect of the L3 vertebra as noted above.  Decreased signal of intensity bone marrow.    Bone scan and hip x ray - 01/2010 (negative)    Pt notes pain worse with laying on her left side   Review of Systems  Past Medical History  Diagnosis Date  . Hyperlipidemia   . Hypertension   . Atypical chest pain   . Depression   . Tobacco abuse   . Chronic insomnia   . PVD (peripheral vascular disease)     segmental femeropoliteal disease    History   Social History  . Marital Status: Single    Spouse Name: N/A    Number of Children: N/A  . Years of Education: N/A   Occupational History  . Not on file.   Social History Main Topics  . Smoking status: Current Everyday Smoker  . Smokeless tobacco: Not on file   Comment: 1 PPD  . Alcohol Use: Not on file  . Drug Use: Not on file  . Sexually Active: Not on file   Other Topics Concern  . Not on file   Social History Narrative   Divorced with 4 children Current Smoker 1ppd No alcoholNo illicit drug use   Occupation: Tax preparation     Past Surgical History  Procedure Date  . Wrist surgery 2005    right wrist  . Cesarean section 1990  . Nasal sinus surgery 1999  . Bunionectomy 1996    bilateral  . Cervical discectomy 06/21/2006     and fusion   . Endometrial ablation June 2010  . Tubal ligation     Family History  Problem Relation Age of Onset  . Alcohol abuse Father     deceased age 46  . Emphysema Mother     deceased age 82  . ADD / ADHD Son     No Known Allergies  Current  Outpatient Prescriptions on File Prior to Visit  Medication Sig Dispense Refill  . amLODipine (NORVASC) 10 MG tablet Take 10 mg by mouth daily.        Marland Kitchen azelastine (ASTELIN) 137 MCG/SPRAY nasal spray 2 sprays by Nasal route 2 (two) times daily. Use in each nostril as directed       . clonazePAM (KLONOPIN) 1 MG tablet Take 1 tablet (1 mg total) by mouth daily as needed.  90 tablet  0  . fluticasone (FLONASE) 50 MCG/ACT nasal spray 2 sprays by Nasal route daily.        Marland Kitchen PARoxetine (PAXIL) 40 MG tablet Take 1 tablet (40 mg total) by mouth every morning.  90 tablet  0  . simvastatin (ZOCOR) 10 MG tablet Take 10 mg by mouth every evening.        . zolpidem (AMBIEN) 10 MG tablet Take 10 mg by mouth at bedtime as needed.        Marland Kitchen rOPINIRole (REQUIP) 2 MG tablet Take 2 mg by mouth at bedtime.          BP 150/80  Pulse  91  Temp(Src) 98 F (36.7 C) (Oral)  Resp 10  Wt 111 lb (50.349 kg)  SpO2 99%       Objective:   Physical Exam     Constitutional: Appears well-developed and well-nourished. No distress.  HENT:  Cardiovascular: Normal rate, regular rhythm and normal heart sounds.  Exam reveals no gallop and no friction rub.   No murmur heard. Pulmonary/Chest: Effort normal and breath sounds normal.  No wheezes. No rales.  Abdominal: Soft. Bowel sounds are normal. No mass. There is no tenderness.  Neurological: Alert. No cranial nerve deficit.  Psychiatric: Normal mood and affect. Behavior is normal.  MSK:  Tenderness of lateral aspect of left hip.  No pain with int and ext rotation    Assessment & Plan:

## 2010-05-16 NOTE — Progress Notes (Signed)
[facility]                        PERIPHERAL VASCULAR OFFICE NOTE   SHARNELL, KNIGHT                       MRN:          [id number]  DATE:12/08/2007                            DOB:          02-23-1960    PRIMARY CARE PHYSICIAN:  [name]. [name], DO   HISTORY OF PRESENT ILLNESS:  Ms. Wimbush is a pleasant 50 year old  Caucasian female with a past medical history significant for  hypertension, hyperlipidemia, depression, and tobacco abuse who was  referred to our Peripheral Vascular Clinic on November 03, 2007, with  complaints of bilateral lower extremity pain and noninvasive arterial  Doppler studies that suggested obstructive disease in the left  superficial femoral artery.  The patient's complaints were consistent  with occlusive peripheral vascular disease.  Because of this, we  proceeded directly to an abdominal aortic angiogram with lower extremity  runoff.  This procedure was performed on November 26, 2007.  I was  surprised to find that the patient had no evidence of obstructive  disease on the lower extremity runoff.  She did have a 20-30% stenosis  in the proximal portion of the left renal artery.  The right common  iliac artery through the toes on the right lower extremity had only  plaque disease noted.  The left common iliac artery had a 30% stenosis  and then plaque disease noted further down.  The patient returns today  to review the results of her testing.   She tells me that she continues to have some pain in the left lower  extremity with ambulation.  This pain does seem to resolve with rest.  She notes that she has a history of restless legs, but denies having any  chronic back problems.  She denies any episodes of chest discomfort,  shortness of breath, diaphoresis, dizziness, near syncope, syncope,  orthopnea, PND, or lower extremity edema.  She also denies any lower  extremity ulcerations or change in sensation over her bilateral  feet.   PAST MEDICAL HISTORY:  Unchanged and is outlined above.   CURRENT MEDICATIONS:  1. Paxil 40 mg once daily.  2. Requip 2 mg at night.  3. Zocor 40 mg once daily.  4. Benicar/HCTZ 20/12.5 mg half tablet once daily.  5. Ambien 10 mg at night.  6. Amlodipine 5 mg once daily.   REVIEW OF SYSTEMS:  As stated in history of present illness and is  otherwise negative.   PHYSICAL EXAMINATION:  VITAL SIGNS:  Blood pressure 110/70, pulse 82 and  regular, respirations 12 and unlabored.  GENERAL:  She is a 50 young Caucasian female in no acute distress.  She is alert and oriented x3.  SKIN:  Warm and dry.  NECK:  No JVD.  No lymphadenopathy.  No thyromegaly.  No carotid bruits.  LUNGS:  Clear to auscultation bilaterally without wheezes, rhonchi, or  crackles noted.  CARDIOVASCULAR:  Regular rate and rhythm without murmurs, gallops or  rubs noted.  ABDOMEN:  Soft, nontender, nondistended.  Bowel sounds are present.  EXTREMITIES:  The right groin is without evidence of hematoma or  ecchymosis.  There  is no lower extremity edema noted.  Pulses are 2+ in  the bilateral dorsalis pedis and posterior tibial arteries.   DIAGNOSTIC STUDIES:  Abdominal aortogram with lower extremity runoff  performed on November 26, 2007, with findings as follows.  The right  renal artery had no evidence of disease.  The left renal artery had  plaque disease noted in the proximal portion that represented a 20-30%  stenosis.  The infrarenal aorta abdominal aorta has no evidence of  plaque disease or aneurysm.  The right common iliac artery has plaque  disease.  The right external iliac artery, right common femoral artery,  right superficial femoral artery, and right popliteal artery had no  evidence of disease.  There is three-vessel runoff in the right lower  extremity.  The left common iliac artery has plaque disease noted.  In  the distal portion of the common segment there is a 30% stenosis.  There   is mild plaque noted in the left external iliac artery.  The left common  femoral artery, left superficial femoral artery, and left popliteal  artery artery had no evidence of disease.  There is three-vessel runoff  in the left lower extremity.   ASSESSMENT AND PLAN:  This is a pleasant 50 year old Caucasian female  with the past medical history significant for hypertension,  hyperlipidemia, and tobacco abuse who was referred to the Peripheral  Vascular Clinic with complaints of claudication and was found by  invasive testing several weeks ago to have only mild plaque disease in  the lower extremities with no significant obstruction.  The patient has  recovered well from her procedure.  She does continue to have some pain  in her left lower extremity.  However, I do not feel that this is  secondary to poor arterial flow.  I have encouraged her to continue  walking on a daily basis and I have also discussed with her again the  importance of tobacco cessation.  I would like her to start taking an  aspirin 81 mg once daily.  She will plan on following up with her  primary care physician Dr. [name] for further evaluation of lower extremity  pain which I feel is not related to obstructive arterial disease.  The  patient will be followed on an as needed basis in this office.     Verne Carrow, MD  Electronically Signed    CM/MedQ  DD: 12/08/2007  DT: 12/09/2007  Job #: [id number]   cc:   [name]. [name], DO

## 2010-05-16 NOTE — Discharge Summary (Signed)
NAMEJEFFREY, VOTH                ACCOUNT NO.:  [id number]   MEDICAL RECORD NO.:  [id number]          PATIENT TYPE:  OBV   LOCATION:  4733                         FACILITY:  [facility]   PHYSICIAN:  Valerie A. Felicity Coyer, MDDATE OF BIRTH:  1960/10/26   DATE OF ADMISSION:  09/23/2006  DATE OF DISCHARGE:  09/25/2006                               DISCHARGE SUMMARY   DISCHARGE DIAGNOSES:  1. Atypical chest pain status post cardiac cath which performed      September 24, 2006 by Dr. Riley Kill which showed normal limit left      ventricle and no critical obstruction  2. Rule out sinusitis.  3. Mild dyslipidemia, plan diet modification and close outpatient      follow-up.  Consider addition of statin, will defer to primary care  4. Hypertension  5. Tobacco abuse motivated to quit.  6. History of depression.  7. History of restless leg syndrome  8. Abdominal bruit.  Plan for abdominal ultrasound.  We will try to      complete this prior to discharge today.   HISTORY OF PRESENT ILLNESS:  Ms. Olson is a 50 year old white female  with past medical history tobacco abuse and hypertension who presents  with a 1 to 2 week history of substernal chest pain and she was admitted  for further evaluation and treatment.   PAST MEDICAL HISTORY:  1. Mood disorder/depression  2. Hypertension.  3. History of chronic insomnia.  4. Periodic limb movement disorder/restless leg syndrome  5. History of right wrist surgery 2005.  6. History of C-section 1990  7. History of sinus surgery 1999.  8. Status post cervical diskectomy and fusion June 21, 2006.   COURSE OF HOSPITALIZATION:  The patient was admitted and underwent  serial cardiac enzymes which were negative Chest x-ray performed on  admission showed bibasilar atelectasis.  She was seen in consultation by  cardiology and underwent a cardiac catheterization performed on  September 24, 2006 by Dr. Riley Kill.  No critical obstruction was noted in  her coronary  arteries.  She has no further complaints of chest pain at  this time, however, she is noted to have a leukocytosis with a white  blood cell count 14,000 as of yesterday afternoon.  Will repeat a CBC  today. If stable, plan to discharge the patient to home.   The patient did complain of sinus congestion.  We will treat her  empirically with Augmentin for presumed sinusitis in setting of mild  leukocytosis.  She has remained afebrile.  Per cardiology's  recommendation he recommended continuing blood pressure meds and  aspirin.   MEDICATIONS:  At time of discharge:  1. Chantix 1 mg tablets 1/2 tablet p.o. daily x3 days, then 1/2 tablet      twice daily x4 days, 1 tablet p.o. b.i.d. times 12 weeks  2. Paroxetine 40 mg p.o. daily.  3. Requip 2 mg p.o. daily at bedtime  4. Benazepril/hydrochlorothiazide 10 mg/12.5 mg p.o. daily  5. Augmentin 500 mg p.o. b.i.d. x10 days.   DISPOSITION:  The patient will be discharged to home.  She is  instructed  follow up with Dr. Thomos Lemons in  1-2 weeks and contact the office for  an appointment.  She is instructed to maintain a low-cholesterol diet.  At her follow-up appointment with Dr. Thomos Lemons she will need follow-up  of final results of her abdominal ultrasound.      Sandford Craze, NP      Raenette Rover. Felicity Coyer, MD  Electronically Signed    MO/MEDQ  D:  09/25/2006  T:  09/25/2006  Job:  [id number]   cc:   [name]. [name], DO

## 2010-05-16 NOTE — Consult Note (Signed)
[name], [name]                ACCOUNT NO.:  [id number]   MEDICAL RECORD NO.:  [id number]          PATIENT TYPE:  INP   LOCATION:  4733                         FACILITY:  [facility]   PHYSICIAN:  Madolyn Frieze. Jens Som, MD, FACCDATE OF BIRTH:  February 21, 1960   DATE OF CONSULTATION:  09/23/2006  DATE OF DISCHARGE:                                 CONSULTATION   PRIMARY CARE PHYSICIAN:  [name]. [name], DO.   PRIMARY CARDIOLOGIST:  New.   CHIEF COMPLAINT:  Chest pain.   HISTORY OF PRESENT ILLNESS:  [name] [name] is a 50 year old female with no  previous history of coronary artery disease.  Last week she had sudden  onset of chest pain that she describes as both aching and sharp enough  to take her breath away.  She has had multiple episodes.  The first  episode occurred with mild exertion.  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  She has had multiple episodes with the intensity reaching an  8/10.  Although the episodes occur both when she is sitting at the  computer and walking around, they seem to be relieved by rest.  They do  not start while she is completely at rest.  Her symptoms seem to be made  worse with deep inspiration.  Last p.m. she had an episode that reached  an 8/10 and was associated with shortness of breath, nausea, and  diaphoresis.  Today, she went to see Dr. [name] and was sent to the  hospital for admission.  She says that she is currently having only very  mild symptoms, and feels that she has some soreness in the front of her  chest that is made slightly worse with deep breathing.  She is otherwise  symptom-free at this time.   PAST MEDICAL HISTORY:  1. Hypertension.  2. Ongoing tobacco use.  3. Family history of coronary artery disease.  4. History of PVCs/palpitations.  5. Depression.  6. Insomnia  7. Restless leg syndrome.   SURGICAL HISTORY:  She is status post right wrist surgery as well  sinus  surgery, C-section, bilateral bunion surgery, and she had neck surgery  most recently in June of 2008.   ALLERGIES:  She is intolerant to decongestants with elevated heart rate.   CURRENT MEDICATIONS:  1. Paxil 40 mg a day.  2. Requip 2 mg nightly.  3. Benazepril/HCTZ 10/12 0.5 daily.  4. Aspirin 325 mg a day.  5. Lovenox 40 subcu daily.  6. Protonix 40 mg a day.   SOCIAL HISTORY:  She lives in [place] with her fiance.  She cleans  houses.  She is divorced and has 4 children.  She has a greater than 30-  pack year history of ongoing tobacco use at a pack a day, but denies  alcohol or drug abuse.   FAMILY HISTORY:  Her mother died at age 61 of emphysema.  Her father  died in his 18s, and possibly had coronary artery disease.  She has two  sisters neither of  whom has coronary artery disease.   REVIEW OF SYSTEMS:  She has problems with insomnia and restless leg  syndrome.  She states that her depression and possibly anxiety are well-  controlled on the Paxil.  She has some chronic arthralgias.  She denies  claudication symptoms.  She has a cough that is generally nonproductive,  but denies wheezing.  She has some shortness of breath whenever she  exerts herself, but his chronic and unchanged.  She denies any recent  fevers or chills or productive cough.  There has been no hematemesis,  hemoptysis, or melena.  Review of systems is otherwise clear.   PHYSICAL EXAM:  VITAL SIGNS:  Temperature 97.8, blood pressure 120/68,  pulse 93, respiratory rate is 20. O2 saturation 98% on room air.  GENERAL:  She is a well-developed, well-nourished white female in no  acute distress.  HEENT:  Normal.  NECK:  There is no lymphadenopathy, thyromegaly, bruit or JVD noted.  CARDIOVASCULAR:  Her heart is regular in rate and rhythm with an S1-S2  and no significant murmur, rub, or gallop is noted.  Distal pulses are  intact in all for extremities, but she has a loud left femoral bruit,  and  also a soft right femoral bruit is noted.  LUNGS:  She has a few rales but are essentially clear to auscultation.  SKIN:  No rashes or lesions are noted.  ABDOMEN:  Soft, nontender with active bowel sounds, and an abdominal  bruit is noted.  EXTREMITIES:  There is no cyanosis, clubbing, or edema noted.  MUSCULOSKELETAL:  There is no joint deformity or effusions, and no spine  or CVA tenderness.  NEURO:  She is alert and oriented.  Cranial nerves II-XII grossly  intact.   Chest x-ray and laboratory values are pending.   EKG is sinus rhythm rate 80s with no acute ischemic changes, and no old  is available for comparison.   IMPRESSION:  [name] [name] is a 50 year old female with a past medical  history of hypertension and bronchitis as well as tobacco use with no  recent lipid profile check, who has no prior cardiac history.  Over the  last, approximately a week, she has been a experiencing intermittent  chest pain that does not radiate and is described as aching.  It has  increased with inspiration, and there is a questionable exertional  component.  Each episode lasts 20-30 minutes.  Last p.m. she had a  significant episode that was associated with shortness of breath,  nausea, and diaphoresis.  Dr. [name] admitted her.  Her chest pain is of  uncertain etiology.  She had cervical neck surgery recently, and has  traveled to the [employer].  There is a pleuritic component.  A D-dimer  is ordered, and the results will be followed as she may need a chest CT  if it is elevated.  Her chest x-ray and other labs are also pending.  Her EKG is  to be repeated in the a.m. and we will cycle cardiac enzymes.  If all  the above testing is negative, Myoview can be performed as an  outpatient.  She was encouraged to discontinue tobacco, and it is  appropriate to add aspirin to her medication regimen long-term, and low-  molecular weight heparin short term.      Theodore Demark, PA-C      Madolyn Frieze.  Jens Som, MD, [facility]  Electronically Signed    RB/MEDQ  D:  09/23/2006  T:  09/24/2006  Job:  [id number]   cc:   [name]. [name], DO

## 2010-05-16 NOTE — Discharge Summary (Signed)
[name] [name], [name] [name]                ACCOUNT NO.:  [id number]   MEDICAL RECORD NO.:  [id number]          PATIENT TYPE:  OBV   LOCATION:  4733                         FACILITY:  [facility]   PHYSICIAN:  Valerie A. Felicity Coyer, MDDATE OF BIRTH:  [birth year]   DATE OF ADMISSION:  09/23/2006  DATE OF DISCHARGE:  09/26/2006                               DISCHARGE SUMMARY   ADDENDUM   The patient underwent an abdominal ultrasound to further evaluate an  abdominal bruit which was within normal limits.  At this time, plan is  to continue treatment for sinusitis and follow up with Dr. Thomos Lemons in  one to two weeks.      Sandford Craze, NP      Raenette Rover. Felicity Coyer, MD  Electronically Signed    MO/MEDQ  D:  09/26/2006  T:  09/26/2006  Job:  [id number]   cc:   [name]. [name], DO

## 2010-05-16 NOTE — H&P (Signed)
[name], [name]                ACCOUNT NO.:  [id number]   MEDICAL RECORD NO.:  [id number]          PATIENT TYPE:  INP   LOCATION:  NA                           FACILITY:  [facility]   PHYSICIAN:  [name]. [name], DO      DATE OF BIRTH:  [birth year]   DATE OF ADMISSION:  09/23/2006  DATE OF DISCHARGE:                              HISTORY & PHYSICAL   CHIEF COMPLAINT:  Chest pain.   HISTORY OF PRESENT ILLNESS:  The patient is a 50 year old white female  with a past medical history of tobacco abuse, hypertension who presents  with 1-2 weeks of substernal chest pain.  She noticed her first episode  while she was walking.  Her pain lasted approximately 45 minutes then  slowly subsided.  She has had recurrence of the discomfort with that  episode of associated diaphoresis yesterday a.m.  The pain also radiates  to her left upper chest.  She denies having similar chest pain in the  past.  Currently, she is not having any pain.  She denies recent calf  tenderness or swelling.  She has had mild lower extremity edema.  She  has a long history of tobacco abuse.  She currently smokes about a pack  per day and has been doing so for the last 33-34 years.   PAST MEDICAL HISTORY SUMMARY:  1. Mood disorder/depression.  2. Hypertension.  3. History of chronic insomnia.  4. Periodic limb movement disorder/restless leg syndrome.  5. History of right wrist surgery in 2005.  6. History of C-section in 1990.  7. History of sinus surgery in 1999.  8. Status post cervical diskectomy and fusion on June 21, 2006.   CURRENT MEDICATIONS:  1. Paroxetine 40 mg once a day.  2. Requip 4 mg one half q.h.s.  3. Benazepril/hydrochlorothiazide 10/12.5, one a day.   ALLERGIES TO MEDICATIONS:  DECONGESTANTS which cause tachycardia.   SOCIAL HISTORY:  The patient is divorced.  She has 4 children.   FAMILY HISTORY:  Father deceased in his late 47s secondary to  alcoholism, question coronary artery disease.  Mother  deceased at age  39, she had emphysema, also question heart disease.  Her son is bipolar  and another son has ADD.   HABITS:  Tobacco use as noted above.  There is no alcohol use or  recreational drug use.   REVIEW OF SYSTEMS:  As noted above, she denies any severe heartburn,  nausea, vomiting, constipation, diarrhea.  All other systems negative.   PHYSICAL EXAMINATION:  VITAL SIGNS:  Height is 4 feet 11 inches, weight  is 109.2 pounds, temperature is 97.2, pulse is 88, BP is 127/79 left arm  in a seated position.  GENERAL:  The patient is a thin 50 year old white female who appears  older than stated age.  HEENT:  Normocephalic atraumatic.  Pupils are equal and reactive to  light bilaterally.  Extraocular motility was intact.  The patient was  anicteric.  Conjunctivae were within normal limits.  NECK:  Supple.  No adenopathy, carotid bruit, or thyromegaly.  CHEST:  Normal respiratory effort.  Chest was clear to auscultation  bilaterally.  No rhonchi, rales, or wheezing.  CARDIOVASCULAR:  Regular rate and rhythm.  No significant murmurs, rubs,  gallops appreciated.  ABDOMEN:  Soft, nontender.  Positive bowel sounds.  No organomegaly.  MUSCULOSKELETAL:  No clubbing, cyanosis, or edema.  The patient had  diminished but palpable dorsalis pedis pulses bilaterally.  SKIN:  Warm and dry.  NEUROLOGIC:  Cranial nerves II-XII grossly intact.  She was nonfocal.   EKG was performed in the office which showed a normal sinus rhythm at 39  beats per minute, no acute ST changes were noted.   IMPRESSION:  1. Chest pain.  2. Hypertension.  3. Ongoing tobacco abuse.  4. Mood disorder/depression.   RECOMMENDATIONS:  1. The patient's symptoms are concerning for coronary etiology.  She      will be admitted for cardiac enzymes, a D-dimer.  We will obtain a      cardiology consult.  2. Start aspirin 325 mg once a day and Lovenox 40 mg subcutaneous once      daily.  3. We have had discussions in  the past regarding tobacco abuse.  We      discussed possibly using Chantix for smoking cessation.      [name]. [name], DO  Electronically Signed     RDY/MEDQ  D:  09/23/2006  T:  09/23/2006  Job:  [id number]

## 2010-05-16 NOTE — Progress Notes (Signed)
[facility]                        PERIPHERAL VASCULAR OFFICE NOTE   JAMISE, PENTLAND                       MRN:          [id number]  DATE:11/03/2007                            DOB:          1960-10-03    PRIMARY CARE PHYSICIAN:  [name]. [name], DO   HISTORY OF PRESENT ILLNESS:  [name] [name] is a pleasant 50 year old  Caucasian female with a past medical history significant for  hypertension, hyperlipidemia, depression, and tobacco abuse, who is  referred to the Peripheral Vascular Clinic today after having complaints  of bilateral lower extremity pain and having been found by noninvasive  arterial Doppler studies to have possible obstructive disease in the  left superficial femoral artery.  The patient tells me that she has been  having bilateral lower extremity pain with ambulation since May 2009.  The pain is usually worse in her left lower extremity.  She tells me  that after walking several 100 feet, she will begin to have pain in her  left calf and occasionally in the left thigh.  There is also some pain  in the right lower extremity, but this is usually much less severe.  The  patient was started on Pletal by Dr. [name]; however, the patient did not  tolerate this medication secondary to headaches.  She has since stopped  taking this.  She was also instructed to begin a walking program;  however, she is not pursuing any daily walking regimen.  She has no  other complaints at this time.  She denies having any lower extremity  pain while at rest.  She also denies any ulcerations or loss of  sensation in her feet.   PAST MEDICAL HISTORY:  1. Hypertension.  2. Hyperlipidemia.  3. Tobacco abuse.  4. Depression.  5. 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   PAST SURGICAL HISTORY:  1. C-section.  2. Bunion removal.  3. Right wrist surgery.  4. Rotator cuff surgery.  5. C-spine  surgery.   ALLERGIES:  No known drug allergies.   CURRENT MEDICATIONS:  1. Paxil 40 mg once daily.  2. ReQuip 4 mg one-half tablet once at night.  3. Zocor 40 mg once daily.  4. Benicar/HCTZ 20/12.5 mg half tablet once daily.  5. Ambien 10 mg at night.   SOCIAL HISTORY:  The patient is divorced and has 4 children and 4  grandchildren.  She is employed as a Occupational psychologist.  She has smoked 1 pack of cigarettes per day for the last 30 years.  She  denies use of alcohol or illicit drugs.   FAMILY HISTORY:  The patient's mother died at age 109 from lung-related  complications of emphysema.  Her father died at the age of 106 from  gangrene.  She has 2 sisters that are alive and healthy.   REVIEW OF SYSTEMS:  As stated in the history of present illness and is  otherwise negative.   PHYSICAL EXAMINATION:  VITAL SIGNS:  Blood pressure 126/68, pulse 89 and  regular, and respirations 12 and  nonlabored.  GENERAL:  Alert and oriented x3 in no acute distress.  SKIN:  Warm and dry.  EXTREMITIES:  Focused peripheral vascular examination.  In the right  lower extremity, there is no evidence of edema.  The pulses are 2+ in  the right dorsalis pedis and right posterior tibial arteries.  There are  no signs of ulcerations or color changes in the skin of the right lower  extremity.  Sensation is intact.  The right femoral artery pulse is 2+.  The right popliteal artery is palpable with the pulse of 1+.   The left lower extremity examination shows no evidence of edema.  The  left femoral artery pulse is 2+.  The left popliteal pulse is 1+.  The  left dorsalis pedis and posterior tibial artery pulses are 1+.  There  are no signs of ulcerations or skin changes in the left lower extremity.  Sensation is intact over the dorsum and plantar surfaces of the left  foot.  The patient's feet are pink and warm.   DIAGNOSTIC STUDIES:  1. A 12-lead EKG shows normal sinus rhythm with no evidence  of      ischemia.  2. Arterial Doppler studies performed at the [facility] in [place], [place] on July 07, 2007 show an ABI on      the right leg of 0.9 and the left leg of 0.68.  Changes in      segmental pressures as well as Doppler examination revealed mild      arterial disease in the right lower extremity and a pattern that is      consistent with femoral popliteal segment disease in the left lower      extremity.   ASSESSMENT AND PLAN:  This is a pleasant 50 year old Caucasian female  with a past medical history significant for hypertension,  hyperlipidemia, and ongoing tobacco abuse, who continues to have left  lower extremity pain with moderate ambulation.  As described above, her  noninvasive studies are suggestive of occlusive disease in the left  superficial femoral artery.  The patient continues to smoke and had no  improvement in her symptoms while taking Pletal.  I have discussed with  the patient possibilities of performing CT angiogram of the lower  extremities or proceeding directly to an abdominal aortogram with lower  extremity runoff.  The patient is in favor of proceeding with the lower  extremity runoff.  We will arrange this on November 26, 2007 at [facility]  The patient will have appropriate laboratory  values drawn prior to the study as well as chest x-ray.  I will plan on  seeing her back in this office following the lower extremity angiogram.     Verne Carrow, MD  Electronically Signed    CM/MedQ  DD: 11/03/2007  DT: 11/03/2007  Job #: [id number]   cc:   [name]. [name], DO

## 2010-05-16 NOTE — Op Note (Signed)
NAMESHAWNTA, SCHLEGEL                ACCOUNT NO.:  [id number]   MEDICAL RECORD NO.:  [id number]          PATIENT TYPE:  OIB   LOCATION:  3012                         FACILITY:  [facility]   PHYSICIAN:  Kathaleen Maser. Pool, M.D.    DATE OF BIRTH:  [birth year]   DATE OF PROCEDURE:  06/21/2006  DATE OF DISCHARGE:  06/21/2006                               OPERATIVE REPORT   PREOPERATIVE DIAGNOSIS:  C5-C6 and C6-C7 stenosis with myelopathy.   POSTOPERATIVE DIAGNOSIS:  C5-C6 and C6-C7 stenosis with myelopathy.   PROCEDURE NOTE:  C5-C6 and C6-C7 anterior cervical discectomy and fusion  with allograft and plating.   SURGEON:  Kathaleen Maser. Pool, M.D.   ASSISTANT:  Tia Alert, M.D.   ANESTHESIA:  General orotracheal.   INDICATIONS FOR PROCEDURE:  Ms. Aron is a 50 year old female with a  history of severe neck pain and bilateral upper extremity symptoms  failing conservative management.  Workup demonstrates evidence of  advanced spondylosis with stenosis at C5-C6 and C6-C7.  The patient  presents now for two level anterior cervical discectomy and fusion,  allograft and plating in hopes of improving her symptoms.   OPERATIVE NOTE:  The patient was taken to the operating room and placed  on the operating table in a supine position.  After an adequate level of  anesthesia was achieved, the patient was positioned supine with her neck  slightly extended and held in place with halter traction.  The patient's  anterior cervical region is prepped and draped sterilely.  A 10 blade is  used to make a linear skin incision overlying the C5 vertebral level.  This was carried down sharply to the platysma.  The platysma was then  divided vertically and dissection carried to the medial border of  sternocleidomastoid muscle and carotid sheath.  The trachea and  esophagus were mobilized and retracted towards the left.  The  prevertebral fascia was stripped from the anterior spinal column.  The  longus coli muscle  was then elevated bilaterally using electrocautery.  Deep self-retaining retractor was placed.  Intraoperative fluoroscopy  views and levels were confirmed.  The disc space at C5-C6 and C6-C7 was  then incised with a 15 blade in a rectangular fashion.  A wide disc  space clean out was achieved using pituitary rongeurs, upward and  backward angle Carlen curets, Kerrison rongeurs, and a high speed drill.  All elements of the discs were removed down to the level of the  posterior annulus.  The microscope was brought on the field and used  throughout the remainder of the discectomy.  The remaining aspects of  the annulus and osteophytes were then removed using a high speed drill  starting first at C4-C5.  The posterior longitudinal ligament was then  elevated and resected in a piecemeal fashion using Kerrison rongeurs.  The underlying thecal sac was identified.  A wide central decompression  was then performed by undercutting the bodies of C5  and C6.  Decompression then proceeded into each neural foramen.  Wide anterior  foraminotomies were then performed along the course of the exiting  C6  nerve roots bilaterally.  At this point, a very thorough decompression  had been achieved.  There was evidence of injury to thecal sac or nerve  roots.  The procedure was then repeated at C6-C7, again, without  complication.  The wound was then irrigated with antibiotic solution.  Gelfoam was placed topically for hemostasis which was found to be good.  A 5 mm Lifenet allograft wedge was then impacted into place and recessed  approximately 1 mm from the anterior cortical margin at C5-C6 and a 6 mm  graft was placed at C6-C7.  A 38 mm Atlantis anterior cervical plate was  then placed over the C5, C6 and C7 levels.  This was attached under  fluoroscopic guidance using 13 mm variable screws, two each at all three  levels.  Locking screws were engaged at all three levels.  Final images  revealed good position  of bone graft and hardware.  The wound was then  irrigated with antibiotic solution.  Hemostasis was ensured with bipolar  cautery.  The wound was then closed in typical fashion.  Steri-Strips  and sterile dressings were applied.  There were no complications.  The  patient tolerated the procedure well and returns to the recovery room  for postoperative care.           ______________________________  Kathaleen Maser. Pool, M.D.     HAP/MEDQ  D:  06/21/2006  T:  06/21/2006  Job:  [id number]

## 2010-05-16 NOTE — Op Note (Signed)
[name], [name]                ACCOUNT NO.:  [id number]   MEDICAL RECORD NO.:  [id number]          PATIENT TYPE:  AMB   LOCATION:  SDC                           FACILITY:  [facility]   PHYSICIAN:  Dineen Kid. Rana Snare, M.D.    DATE OF BIRTH:  [birth year]   DATE OF PROCEDURE:  06/01/2008  DATE OF DISCHARGE:                               OPERATIVE REPORT   PREOPERATIVE DIAGNOSES:  Abnormal uterine bleeding, endometrial mass,  menorrhagia, and desires sterility.   POSTOPERATIVE DIAGNOSES:  Abnormal uterine bleeding, endometrial mass,  menorrhagia, desires sterility, and endometrial polyps.   SURGEON:  Dineen Kid. Rana Snare, MD   PROCEDURE:  Laparoscopic tubal ligation by Filshie clip, hysteroscopy,  dilatation and curettage with polypectomy, and NovaSure endometrial  ablation.   ANESTHESIA:  General endotracheal.   INDICATIONS:  [name] [name] is a 50 year old with worsening abnormal uterine  bleeding.  She presented to my office with a hemoglobin of 8.3.  She  required blood transfusion, also IV iron.  Saline infusion ultrasound  shows endometrial mass consistent with endometrial polyp.  She has also  had a history of menorrhagia for number of years and has had D and C in  the past.  She is not menopausal based on recent [facility].  She desires  definitive surgical intervention including D and C.  She also has  multiparity, desires sterility and wants to proceed also with the  NovaSure ablation since she does have menorrhagia and is not menopausal.  The risks and benefits of the above procedures were discussed at length  which included, but not limited to risk of infection, bleeding, damage  to the uterus, tubes, ovary, bowel, bladder, possibly this may not  alleviate the bleeding, to recur or worsen, could require further  surgery if the polyps or curettings turn out to be cancer or precancer  and the tubal failure rate recorded at 5 out of a 1000.  She gives  informed consent and wished to proceed.   FINDINGS:  At the time of surgery, normal intra-abdominal anatomy  including normal liver.  Unable to see the appendix, but she did have a  normal cecum.  Uterus, tubes, and ovaries appeared to be normal as well.  The endometrial lining did have multiple small endometrial polyps.  Otherwise normal-appearing ostia and cervix.   DESCRIPTION OF PROCEDURE:  After adequate analgesia, the patient was  placed in the dorsal lithotomy position.  She was sterilely prepped and  draped.  The bladder was sterilely drained.  Graves speculum was placed.  Tenaculum was placed at the anterior lip of the cervix.  A 1-cm  infraumbilical skin incision was made.  A Veress needle was inserted.  The abdomen was insufflated, dullness to percussion.  An 11-mm trocar  was inserted and the above findings were noted.  A Filshie clip was  placed across the left and right fallopian tubes.  After identifying the  fimbriated end, the clips were placed in the midportion of the fallopian  tube with good placement noted with placement over the entirety of each  fallopian tube.  After careful evaluation  and proper placement was  assured, the camera and trocars were removed.  The infraumbilical skin  incision was closed with 0 Vicryl interrupted suture, the fascia 3-0  Vicryl Rapide subcuticular suture.  The incision was injected with 0.25%  Marcaine, total of 10 mL used.  The legs were repositioned.  The  speculum was placed.  The uterus sounded to 9 cm with a cervical length  at 4 cm and cavity length at 5 cm, easily dilated to #27 Shawnie Pons dilator.  Hysteroscope was inserted.  The above findings were noted.  Curettage  performed, retrieving fragments of the endometrial lining and also  endometrial polyps.  Re-examination revealed no further endometrial  polyps.  The NovaSure device was inserted with a cavity width of 4.2 cm  and easily passed a cavity assessment test.  The device was then  activated at a wattage of 116 watts  for 1 minute and 25 seconds.  Device  was removed.  Reexamination of the hysteroscope revealed good thermal  cautery effect with no obvious complications noted.  The hysteroscope  was then removed.  Tenaculum was removed from the cervix, noted to be  hemostatic.  The patient was stable and transferred to the recovery  room.  Sponge and needle counts were normal x3.  Estimated blood loss  was minimal.  Sorbitol deficit 40 mL.  The patient received 1 g of  cefotetan preoperatively and 30 mg Toradol postoperatively.   DISPOSITION:  The patient will be discharged home and will follow up in  the office 2-3 weeks and has a  routine instruction sheet for D and C  and tubal.  Told to return for increased pain, fever or bleeding.  Also  sent home with prescription for Tylox #20.      Dineen Kid Rana Snare, M.D.  Electronically Signed     DCL/MEDQ  D:  06/01/2008  T:  06/02/2008  Job:  [id number]

## 2010-05-16 NOTE — Op Note (Signed)
[name] [name], [name] [name]                ACCOUNT NO.:  [id number]   MEDICAL RECORD NO.:  [id number]          PATIENT TYPE:  AMB   LOCATION:  SDS                          FACILITY:  [facility]   PHYSICIAN:  Verne Carrow, MDDATE OF BIRTH:  April 09, 1960   DATE OF PROCEDURE:  11/26/2007  DATE OF DISCHARGE:  11/26/2007                               OPERATIVE REPORT   ABDOMINAL AORTOGRAM WITH LOWER EXTREMITY RUNOFF   PRIMARY CARE PHYSICIAN:  [name]. [name], DO   PROCEDURES PERFORMED:  1. Nonselective abdominal aortogram at the level of the renal      arteries.  2. Infrarenal aortogram with lower extremity runoff.   OPERATOR:  Verne Carrow, MD   INDICATIONS:  Bilateral lower extremity pain that is worse on the left  in a 50 year old female with a history of hypertension, hyperlipidemia,  and tobacco abuse.  The patient describes a cramping sensation in her  left calf when she walks 100 feet.  The patient had noninvasive arterial  Doppler studies performed by her primary care physician that showed  possible obstructive disease in the left superficial femoral artery.  The patient did not tolerate Pletal therapy secondary to headaches.  She  also has not been compliant with the prescribed walking program.   DETAILS OF PROCEDURE:  The patient was brought to the Peripheral  Vascular Laboratory after signing the informed consent for the  procedure.  The right groin was prepped and draped in a sterile fashion.  A 5-French sheath was inserted into the right femoral artery without  difficulty.  Lidocaine 1% was used for local anesthesia.  A 5-French  pigtail catheter was advanced within the abdominal aorta to the level  above the renal arteries.  Angiography was performed from the level of  the renal arteries with lower extremity runoff through the toes  bilaterally.  The patient tolerated the procedure well and was taken to  the holding area in stable condition.   FINDINGS:  1. The right  renal artery has no evidence of disease.  2. The left renal artery has plaque disease noted in the proximal      portion of the vessel.  This represents a 20-30% stenosis.  3. The infrarenal abdominal aorta has no evidence of plaque disease or      aneurysm.  4. The right common iliac artery has plaque disease.  The right      external iliac artery, right common femoral artery, right      superficial femoral artery, and right popliteal artery have no      evidence of disease.  There is three-vessel runoff in the right      lower extremity.  The anterior tibial artery, peroneal artery, and      posterior tibial artery are patent on the right.  5. The left common iliac artery has plaque disease noted.  In the      distal portion of the segment, there is a 30% stenosis.  There is      mild plaque noted in the left external iliac artery.  The left  common femoral artery, left superficial femoral artery, and left      popliteal artery have no evidence of disease.  There is three-      vessel runoff with patent anterior tibial artery, peroneal artery,      and posterior tibial artery in the left lower extremity.   IMPRESSION:  1. 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  2. 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   RECOMMENDATIONS:  This patient has no significant obstructive disease  noted in the abdominal aorta or the bilateral lower extremities.  I do  not think that her pain in her legs is secondary to poor arterial blood  flow.  I have discussed with her the importance of smoking cessation.  I  will plan on seeing her back in the office several weeks to make sure  her cath site looks okay.  I think this patient needs to be seen by her  primary care physician and have further workup of her bilateral lower  extremity pain.      Verne Carrow, MD  Electronically Signed     CM/MEDQ  D:  11/26/2007  T:  11/26/2007  Job:   [id number]   cc:   [name]. [name], DO

## 2010-05-16 NOTE — Cardiovascular Report (Signed)
NAMEAVEEN, STANSEL                ACCOUNT NO.:  [id number]   MEDICAL RECORD NO.:  [id number]          PATIENT TYPE:  OBV   LOCATION:  4733                         FACILITY:  [facility]   PHYSICIAN:  Arturo Morton. Riley Kill, MD, FACCDATE OF BIRTH:  July 26, 1960   DATE OF PROCEDURE:  09/24/2006  DATE OF DISCHARGE:                            CARDIAC CATHETERIZATION   INDICATIONS:  The patient is a 50 year old female with a history of  hypertension and chronic surgery.  She has had some recent chest  discomfort.  Her D-dimer is negative.  With recurrent episodes, cardiac  catheterization was recommended.  She was seen by Dr. Jens Som in  consultation and permission obtained.   PROCEDURES:  1. Left heart catheterization.  2. Selective coronary arteriography.  3. Selective left ventriculography.  4. Aortic root aortography.   DESCRIPTION OF PROCEDURE:  The procedure was performed from the right  femoral artery.  We used a 5-French sheath.  There was some difficulty  engaging the right coronary ostium because it had a stakeoff anteriorly  and in the roof of the right coronary cusp.  However, using a left  bypass catheter, we were able to get quite near the ostium and get  excellent views of the right coronary.  Following this, central aortic  and left ventricular pressures were measured with a pigtail and  ventriculography was performed in the RAO projection.  This was followed  by proximal root aortography.  The patient tolerated the procedure  without complication and was taken to the holding area in satisfactory  clinical condition.   HEMODYNAMIC DATA:  1. Central aortic pressure 98/58, mean 76.  2. Left ventricular pressure 107/16.  3. There was no gradient on pullback across the aortic valve.   ANGIOGRAPHIC DATA:  1. The left main coronary artery was free of critical disease.  2. The left anterior descending artery courses to the apex.  There is      minimal luminal irregularity perhaps  in the LAD, and only in one      view, but no significant focal obstruction.  It provides a septal      and a diagonal.  3. There is a tiny first marginal branch that has perhaps mild ostial      irregularity, but it is a very small, insignificant vessel.  The      major circumflex provides several marginal branches and is free of      critical disease.  4. The right coronary artery has a superior takeoff from the right      coronary cusp.  It provides a posterior descending and      posterolateral system, all of which are free of critical disease.  5. A ventriculogram demonstrates vigorous global systolic function      without segmental wall motion abnormality.  6. The aortic root demonstrates no evidence of dissection.   CONCLUSIONS:  1. Normal left ventricular function.  2. No critical obstruction.  3. No evidence of aortic dissection.   PLAN:  The patient will need to be treated medically.  Absolute  discontinuation of smoking would be  highly recommended.  Treatment of  her hypertension is also important.      Arturo Morton. Riley Kill, MD, [facility]  Electronically Signed     TDS/MEDQ  D:  09/24/2006  T:  09/25/2006  Job:  [id number]   cc:   [name]. [name], DO  Madolyn Frieze. Jens Som, MD, [facility]  CV Laboratory

## 2010-05-16 NOTE — Discharge Summary (Signed)
[name] [name], [name] [name]                ACCOUNT NO.:  [id number]   MEDICAL RECORD NO.:  [id number]          PATIENT TYPE:  OBV   LOCATION:  4733                         FACILITY:  [facility]   PHYSICIAN:  Valerie A. Felicity Coyer, MDDATE OF BIRTH:  December 14, 1960   DATE OF ADMISSION:  09/23/2006  DATE OF DISCHARGE:  09/26/2006                               DISCHARGE SUMMARY   ADDENDUM   The patient was started on Zocor 40 mg p.o. daily by Dr. Jens Som; she  will be given a prescription for a 30-day supply with one refill at the  time of discharge.      Sandford Craze, NP      Raenette Rover. Felicity Coyer, MD  Electronically Signed    MO/MEDQ  D:  09/26/2006  T:  09/26/2006  Job:  [id number]

## 2010-05-19 NOTE — Assessment & Plan Note (Signed)
[facility]                             PRIMARY CARE OFFICE NOTE   ALA, [name] [name] [name]                     MRN:          [id number]  DATE:10/18/2005                            DOB:          21-Jun-1960    CHIEF COMPLAINT:  New patient to my practice.   HISTORY OF PRESENT ILLNESS:  The patient is [name] [name] year old white female here  to establish primary care.  She moved to [place] approximately 1-1/2  years ago.  She has been seen by Dr. 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  The patient also is suspected to have possible parasomnia.  She  has had sleep study in the past, notes that she frequently talks in her  sleep, has had chronic insomnia for many years.  She has tried multiple  agents, most recently Klonopin at bedtime, saw Dr. Shelle Iron and was switched  to Trazodone 50 mg, 1-2 tablets at bedtime.  The patient states that her  sleep has minimally improved.   She notes difficulty initiating sleep as well as staying asleep.  She  comments that her mind frequently races.  This has been going on since her  teenage years.   Her other medical history is significant for possible depression.  This was  noted by her last primary care physician in [place], [place].  She  has been on paroxetine 40 mg over the last 4 years.  She states that it has  improved her symptoms of depressed mood.  Socially at the time the  paroxetine was started, she had changed jobs and she had increased stress  and the patient states she was generally unhappy.   She also has [name] [name] history of anger management issues, has been married four  times, is currently divorced.  She does note [name] [name] history of impulsive behavior  and racing thoughts.   She has been on hydrochlorothiazide for high blood pressure.  She notes that  she takes her blood pressure as an outpatient and frequently notes  systolic  blood pressure greatest in the 160s.   PAST MEDICAL HISTORY SUMMARY:  1. Mood disorder/depression.  2. History of hypertension.  3. History of chronic insomnia.  4. 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  5. Possible parasomnia.  6. Tobacco abuse.  7. History of wrist surgery, right side, 2005.  8. History of C-section 1990.  9. History of sinus surgery in 1999.   CURRENT MEDICATIONS:  1. HCTZ 25 mg once [name] [name] day.  2. Paroxetine 40 mg once [name] [name] day.  3. Requip 4 mg, 1/2 q.h.s.  4. Potassium chloride 20 mEq, 1 [name] [name] day.  5. Trazodone 50 mg, 1 at bedtime.  6. Nu-Iron 150 mg, 2 [name] [name] day.   ALLERGIES TO MEDICATIONS:  DECONGESTANTS WHICH CAUSE ELEVATED HEART RATE.   SOCIAL HISTORY:  The patient is divorced, works at [employer].  Has four  children.   FAMILY HISTORY:  Father deceased at age 52, history of alcoholism.  Mother  deceased at age 63 secondary to complications  of emphysema.  Her son is  noted to be bipolar.  She has another son that is ADD.   HABITS:  She does not drink.  She smoked [name] [name] pack per day for the last 32  years.   REVIEW OF SYSTEMS:  No fevers, chills.  No HEENT symptoms.  The patient  denies any chest pain but does not some mild dyspnea on exertion.  No  heartburn, nausea, vomiting, constipation, diarrhea.  No dark stools or  blood in her stool, and all other systems negative.   PHYSICAL EXAMINATION:  VITAL SIGNS:  Height is 4 feet 11 inches.  Weight 123  pounds.  Temperature 98.3.  Pulse 90.  Blood pressure 160/80 in the left arm  in [name] [name] seated position with manual cuff.  IN GENERAL:  The patient is [name] [name], 50 year old, very pleasant white female  in no apparent distress.  HEENT:  Normocephalic, atraumatic.  Pupils were equal and reactive to light  bilaterally.  Extraocular muscles intact.  Sclerae anicteric.  Conjunctivae  within normal limits.  External auditory canals and tympanic membranes were  clear bilaterally.  Oropharyngeal  exam was unremarkable.  NECK:  The patient notes some mild left neck tenderness confirmed by  palpation.  No masses appreciated.  She had no carotid bruit.  No  thyromegaly or thyroid nodules.  CHEST EXAM:  Normal respiratory effort.  Chest was clear to auscultation  bilaterally.  CARDIOVASCULAR:  Regular rate and rhythm.  No significant murmurs, rubs, or  gallops appreciated.  ABDOMEN:  Soft, nontender.  Positive bowel sounds, no organomegaly.  MUSCULOSKELETAL EXAM:  No clubbing, cyanosis, or edema.  The patient has  intact but decreased pedis dorsalis pulses and also decreased tibialis  posterior, left greater than right.  SKIN:  Warm and dry.  Examination of her left foot notes patches of  scaliness with erythema on the medial aspect of her left foot and also the  dorsal aspect of the left foot.  NEUROLOGIC:  Cranial nerves II through XII grossly intact.  She was  nonfocal.   IMPRESSION/RECOMMENDATIONS:  1. Hypertension, uncontrolled.  2. Ongoing tobacco abuse.  3. Chronic insomnia, likely secondary to underlying bipolar disorder.  4. History of restless leg syndrome.  5. Probable eczema.  6. History of anemia.  7. Health maintenance.   The patient's blood pressure is suboptimally controlled.  We discussed the  possible side effects of ACE inhibitor cough and ACE inhibitors exacerbating  her breathing in [name] [name] long-term smoker; however, the patient does not have any  insurance coverage and would like to try generics.  We will discontinue her  hydrochlorothiazide and she will be started on  benazepril/hydrochlorothiazide 10/12.5, 1 [name] [name] day.  She is to note any change  in her respiratory status.   For her probable eczema of her left foot, she was given [name] [name] prescription for  Triamcinolone cream 0.5% to be applied b.i.d.  She notes that she has failed  multiple over-the-counter antifungals as well as home remedies.  Her chronic insomnia is likely secondary to underlying bipolar  disorder.  We  discussed the risks and benefits of using atypical agents such as Zyprexa.  We will refer this patient to Psychiatry, Dr. Wynonia Lawman, for further followup,  but she will be started on Zyprexa 2.5 mg at bedtime.  She was warned  regarding possible weight gain and was encouraged to control her appetite as  best as possible.   We briefly have discussed smoking cessation.  She is somewhat interested  in  decreasing or smoking cessation and follow-up visit.  We will further  discuss possibility of starting Chantix therapy.   Before follow-up visit in 2 weeks, she will come in for screening blood work  including thyroid, electrolytes, fasting blood sugar, and fasting lipid  panel.       [name]. [name], DO      RDY/MedQ  DD:  10/18/2005  DT:  10/21/2005  Job #:  [id number]

## 2010-05-19 NOTE — Assessment & Plan Note (Signed)
[facility]                               PULMONARY OFFICE NOTE   [name] [name], GROSECLOSE                     MRN:          [id number]  DATE:09/04/2005                            DOB:          01-01-1961    SUBJECTIVE:  [name] is a 50 year old woman who follows up today for her  periodic limb movement disorder and restless leg syndrome.  She has had  significant difficulty with her leg symptoms for the last several weeks.  In  particular, she has significant discomfort and a creepy crawly feeling in  her legs during the day while at work.  She also has some pain which  requires her to get up frequently to walk.  Her nighttime symptoms may be a  little bit better and she has been told by her partner that she does not  kick as much in the bed.  She continues to have daytime sleepiness, however.  She did not complain of any lower extremity numbness or tingling.  Her lower  extremity strength is intact.   MEDICATIONS:  1. HCTZ 25 mg daily.  2. Paroxetine 40 mg daily.  3. Klonopin 1 mg at bedtime.  4. Requip 2 mg at bedtime.   EXAMINATION:  GENERAL:  This is a well-appearing woman who is in no distress  on room air.  VITAL SIGNS:  Weight 124 pounds, temperature 97.9, blood pressure 174/86,  heart rate 91, SBO2 98% on room air.  HEENT:  Exam is benign.  LUNGS:  Clear to auscultation bilaterally.  HEART:  Regular rate and rhythm without murmur.  ABDOMEN:  Soft, nontender, nondistended, positive bowel sounds.  EXTREMITIES:  Have no clubbing, cyanosis or edema.  NEUROLOGIC:  She has normal lower extremity sensation without any  paresthesias or sensory abnormalities.  Her motor function is grossly  intact.   IMPRESSION AND PLANS:  Periodic limb movement disorder with restless leg  syndrome.  She continues to have bothersome daytime symptoms.  I will check  iron studies and a renal panel today.  I discussed her case with Dr. Shelle Iron  and we will  initiate a trial of changing her Requip schedule to 1 mg b.i.d.  to see if this better addresses any daytime symptoms.  I have recommended  and she agrees that she should follow up with one of our sleep specialists,  either Dr. Shelle Iron or Dr. Craige Cotta, to evaluate further.  It may be necessary to  modulate her therapy to better treat her restless leg syndrome or she may  need further evaluation to rule out another process such as a parasomnia.                                  Leslye Peer, MD   RSB/MedQ  DD:  09/04/2005  DT:  09/04/2005  Job #:  [id number]

## 2010-06-16 ENCOUNTER — Ambulatory Visit: Payer: Managed Care, Other (non HMO) | Admitting: Internal Medicine

## 2010-06-30 ENCOUNTER — Ambulatory Visit: Payer: Managed Care, Other (non HMO) | Admitting: Family

## 2010-06-30 DIAGNOSIS — Z0289 Encounter for other administrative examinations: Secondary | ICD-10-CM

## 2010-09-14 ENCOUNTER — Observation Stay (HOSPITAL_COMMUNITY)
Admission: EM | Admit: 2010-09-14 | Discharge: 2010-09-16 | Disposition: A | Discharge status: Home / Self Care | Payer: Managed Care, Other (non HMO) | Attending: Internal Medicine | Admitting: Internal Medicine

## 2010-09-14 DIAGNOSIS — R0789 Other chest pain: Principal | ICD-10-CM | POA: Insufficient documentation

## 2010-09-14 DIAGNOSIS — I10 Essential (primary) hypertension: Secondary | ICD-10-CM | POA: Insufficient documentation

## 2010-09-14 DIAGNOSIS — R61 Generalized hyperhidrosis: Secondary | ICD-10-CM | POA: Insufficient documentation

## 2010-09-14 DIAGNOSIS — R110 Nausea: Secondary | ICD-10-CM | POA: Insufficient documentation

## 2010-09-14 DIAGNOSIS — F411 Generalized anxiety disorder: Secondary | ICD-10-CM | POA: Insufficient documentation

## 2010-09-14 DIAGNOSIS — F17200 Nicotine dependence, unspecified, uncomplicated: Secondary | ICD-10-CM | POA: Insufficient documentation

## 2010-09-15 ENCOUNTER — Emergency Department (HOSPITAL_COMMUNITY): Payer: Managed Care, Other (non HMO)

## 2010-09-15 ENCOUNTER — Encounter (HOSPITAL_COMMUNITY): Payer: Self-pay | Admitting: Radiology

## 2010-09-15 LAB — DIFFERENTIAL
Lymphs Abs: 2.1 10*3/uL (ref 0.7–4.0)
Monocytes Relative: 7 % (ref 3–12)
Neutro Abs: 4.6 10*3/uL (ref 1.7–7.7)
Neutrophils Relative %: 62 % (ref 43–77)

## 2010-09-15 LAB — CK TOTAL AND CKMB (NOT AT ARMC)
CK, MB: 2.1 ng/mL (ref 0.3–4.0)
CK, MB: 2.1 ng/mL (ref 0.3–4.0)
Relative Index: INVALID (ref 0.0–2.5)
Total CK: 35 U/L (ref 7–177)

## 2010-09-15 LAB — BASIC METABOLIC PANEL
BUN: 6 mg/dL (ref 6–23)
Chloride: 102 mEq/L (ref 96–112)
Creatinine, Ser: 0.66 mg/dL (ref 0.50–1.10)
GFR calc Af Amer: >60 mL/min (ref >60)
Glucose, Bld: 97 mg/dL (ref 70–99)

## 2010-09-15 LAB — TROPONIN I
Troponin I: <0.3 ng/mL (ref <0.30)
Troponin I: <0.3 ng/mL (ref <0.30)

## 2010-09-15 LAB — HEPATIC FUNCTION PANEL
ALT: 10 U/L (ref 0–35)
AST: 12 U/L (ref 0–37)
Albumin: 3.3 g/dL — ABNORMAL LOW (ref 3.5–5.2)
Bilirubin, Direct: <0.1 mg/dL (ref 0.0–0.3)

## 2010-09-15 LAB — CBC
MCH: 30.1 pg (ref 26.0–34.0)
MCV: 86.7 fL (ref 78.0–100.0)
Platelets: 214 10*3/uL (ref 150–400)
RBC: 4.15 MIL/uL (ref 3.87–5.11)
RDW: 13.5 % (ref 11.5–15.5)

## 2010-09-15 MED ORDER — IOHEXOL 300 MG/ML  SOLN
100.0000 mL | Freq: Once | INTRAMUSCULAR | Status: AC | PRN
  Administered 2010-09-15: 100 mL via INTRAVENOUS

## 2010-09-16 LAB — LIPID PANEL: Cholesterol: 157 mg/dL (ref 0–200)

## 2010-09-16 LAB — CARDIAC PANEL(CRET KIN+CKTOT+MB+TROPI)
CK, MB: 1.9 ng/mL (ref 0.3–4.0)
Relative Index: INVALID (ref 0.0–2.5)
Total CK: 32 U/L (ref 7–177)
Total CK: 29 U/L (ref 7–177)
Troponin I: <0.3 ng/mL (ref <0.30)

## 2010-09-16 LAB — TSH: TSH: 1.064 u[IU]/mL (ref 0.350–4.500)

## 2010-09-26 NOTE — Discharge Summary (Signed)
[name] [name], CZARNECKI NO.:  [id number]  MEDICAL RECORD NO.:  [id number]  LOCATION:  3703                         FACILITY:  [facility]  PHYSICIAN:  Erick Blinks, MD     DATE OF BIRTH:  1960/12/23  DATE OF ADMISSION:  09/14/2010 DATE OF DISCHARGE:                              DISCHARGE SUMMARY   PRIMARY CARE PHYSICIAN:  Dr. Thomos Lemons.  DISCHARGE DIAGNOSES: 1. Atypical chest pain possibly secondary to anxiety, resolved. 2. Hypertension. 3. Tobacco use. 4. Anxiety.  DISCHARGE MEDICATIONS: 1. Klonopin 1 mg tablets 0.25-0.5 tablets p.o. b.i.d. p.r.n. the     patient takes 0.25 tablets during the day and 0.5 tablets at     bedtime. 2. Lisinopril 20 mg p.o. q.p.m. 3. Paroxetine 40 mg p.o. q.a.m. 4. Vitamin B6 one tablet p.o. daily p.r.n. 5. Vitamin B12 1 tablet p.o. daily p.r.n. 6. Ibuprofen 200 mg 2 tablets p.o. t.i.d. p.r.n. 7. Enteric-coated aspirin 81 mg p.o. daily. 8. Nicotine 21 mg patch transdermal daily. ADMISSION HISTORY:  This is a 50 year old female who presents to the emergency room with complaints of chest pain.  She had described this pain developed mostly on the right side of her chest radiating into her right arm.  She had some mild dizziness and nausea.  These symptoms lasted for about 25 minutes after which they resolved.  In the ER, the patient was found to have negative cardiac enzymes.  She did have an elevated D-dimer, but CT angio of her chest was negative for any pulmonary embolus.  She was subsequently referred for observation.  For details, please refer to history and physical per Dr. Toniann Fail on September 14.  HOSPITAL COURSE:  Atypical chest pain.  The patient was monitored here in the hospital on telemetry.  She ruled out for acute coronary syndrome with negative cardiac enzymes.  Repeat EKG was found to be completely normal and the next day she did not had any recurrence of her symptoms since she has been here in the hospital.   The patient is ambulating without difficulty and no recurrence of chest pain.  She is requesting discharge home and seems appropriate to have the patient follow up with [facility] who she has seen in the past for an outpatient stress test.  Of note, the patient did have a cardiac catheterization done in 2008 which did not show any critical obstruction.  She has not been following with the cardiology group regularly.  I have requested that [facility] to contact the patient for a followup stress test to be done as an outpatient.  DIAGNOSTIC IMAGING:  CT angio of the chest on admission shows no evidence of pulmonary embolus, minimal bilateral dependent segmental atelectasis, few small blood seen within the right lung.  DISCHARGE INSTRUCTIONS:  The patient should follow up with her primary care physician in the next 1 month.  She should have an outpatient stress test and will be contacted by [facility] for this.  She should continue on heart-healthy diet, conduct her activity as tolerated.  CONDITION AT TIME OF DISCHARGE:  Improved.     Erick Blinks, MD     JM/MEDQ  D:  09/16/2010  T:  09/16/2010  Job:  [id number]  cc:   [name]. [name], DO  Electronically Signed by Erick Blinks  on 09/26/2010 08:03:08 PM

## 2010-10-01 NOTE — H&P (Signed)
[name] [name], LEISEY NO.:  [id number]  MEDICAL RECORD NO.:  [id number]  LOCATION:  [facility]                         FACILITY:  [facility]  PHYSICIAN:  Eduard Clos, MDDATE OF BIRTH:  1960/01/29  DATE OF ADMISSION:  09/14/2010 DATE OF DISCHARGE:                             HISTORY & PHYSICAL   PRIMARY CARE PHYSICIAN:  [name]. [name], DO  CHIEF COMPLAINT:  Chest pain.  HISTORY OF PRESENTING ILLNESS:  A 50 year old female with a history of hypertension, anxiety, and ongoing tobacco abuse presented with complaint of chest pain.  The patient states around 10:20 p.m. while she was working at home she started developing chest pain.  Chest pain was mostly on the right side, crushing in nature, radiated to right arm and lasted for around 25 minutes.  Along with it, the patient had some mild dizziness and nausea.  The symptoms are completely resolved at this time.  The patient in the ER had negative cardiac enzymes and had mildly elevated D-dimer.  CT angio chest is negative for any PE.  The patient has been admitted for further observation.  The patient is currently chest pain free.  Denies any shortness of breath at this time.  Denies any nausea, vomiting, abdominal pain, dysuria, discharge, diarrhea.  Denies any fever, chills.  Had complained of some cough.  Denies any headache, visual symptoms, any loss of consciousness or any focal deficit.  PAST MEDICAL HISTORY: 1. Hypertension. 2. Anxiety.  PAST SURGICAL HISTORY:  Cervical spine surgery and wrist surgery.  MEDICATIONS ON ADMISSION: 1. B12. 2. B6. 3. Paroxetine 40 mg p.o. daily. 4. Lisinopril 20 mg daily. 5. Clonazepam 1 mg p.o. 0.25-0.5 tablet twice daily as needed.  ALLERGIES:  No known drug allergies.  FAMILY HISTORY:  Positive for mom having COPD and dying at age 60.  Dad had coronary artery disease and died at age 4.  SOCIAL HISTORY:  The patient smokes cigarettes.  Denies any alcohol, drug  abuse.  REVIEW OF SYSTEMS:  As per pertinent history of presenting illness. Nothing else significant.  PHYSICAL EXAMINATION:  GENERAL:  The patient examined at bedside, not in acute distress. VITAL SIGNS:  Blood pressure 128/58, pulse 60 per minute, respirations 18 per minute, O2 sat 100%. HEENT:  Anicteric.  No pallor.  No discharge from ears, eyes, nose or mouth. CHEST:  Bilateral air entry present.  No rhonchi.  No crepitation.  I do not see any vesicular rash. HEART:  S1 and S2 heard. ABDOMEN:  Soft, nontender.  Bowel sounds heard. CNS:  The patient is alert, awake, and oriented to time, place, and person.  Moves upper and lower extremities, 5/5. EXTREMITIES:  Peripheral pulses felt.  No edema.  LABORATORY DATA:  EKG shows normal sinus rhythm with poor R-wave progression in V1, V2 with non-specific ST-T changes.  Heart rate is around 67 beats per minute.  Chest x-ray shows no acute cardiopulmonary process.  CT angio chest shows no evidence of pulmonary embolism, minimal bilateral dependent __________.  Lungs otherwise clear.  There is a small cleft seen within the right lung.  CBC:  WBC is 7.4, hemoglobin is 12.5, hematocrit is 36, platelets 214.  D-dimer 2.1. Basic metabolic panel:  Sodium 134, potassium 3.7, chloride 92, carbon dioxide 25, glucose 97, BUN 6, creatinine 0.6, calcium 9.1, troponin 0.07.  ASSESSMENT: 1. Chest pain, rule out acute coronary syndrome. 2. History of hypertension. 3. History of tobacco abuse. 4. History of anxiety.  PLAN: 1. At this time, we will admit the patient to telemetry. 2. For her chest pain, we will cycle cardiac markers and place her on     aspirin.  We will hold off ibuprofen.  I am also getting a 2-D     echo.  At this time, I am adding LFTs and lipase.  LFTs are     elevated.  We need to press on her abdomen to check for     gallbladder. 3. Further recommendation based on tests ordered and clinical course.     Eduard Clos, MD     ANK/MEDQ  D:  09/15/2010  T:  09/15/2010  Job:  [id number]  cc:   [name]. [name], DO  Electronically Signed by Midge Minium MD on 10/01/2010 11:57:37 AM

## 2010-10-02 ENCOUNTER — Encounter: Payer: Self-pay | Admitting: Cardiovascular Disease

## 2010-10-12 ENCOUNTER — Encounter: Payer: Self-pay | Admitting: Cardiovascular Disease

## 2010-10-12 LAB — CARDIAC PANEL(CRET KIN+CKTOT+MB+TROPI)
CK, MB: 0.8
CK, MB: 0.8
Relative Index: INVALID
Total CK: 34
Total CK: 42
Total CK: 45
Troponin I: 0.01

## 2010-10-12 LAB — COMPREHENSIVE METABOLIC PANEL
AST: 14
Albumin: 3.8
Alkaline Phosphatase: 103
Chloride: 100
Creatinine, Ser: 0.78
GFR calc Af Amer: >60
Potassium: 3.8
Total Bilirubin: 0.7
Total Protein: 6.8

## 2010-10-12 LAB — LIPID PANEL
HDL: 34 — ABNORMAL LOW
Triglycerides: 94
VLDL: 19

## 2010-10-12 LAB — CBC
Platelets: 262
Platelets: 308
RDW: 14
RDW: 14
WBC: 14.6 — ABNORMAL HIGH

## 2010-10-12 LAB — PREGNANCY, URINE: Preg Test, Ur: NEGATIVE

## 2010-10-12 LAB — PROTIME-INR
INR: 0.9
Prothrombin Time: 12.7

## 2010-10-12 LAB — TSH: TSH: 1.253

## 2010-10-18 LAB — CBC
Hemoglobin: 13.6
RBC: 4.4

## 2010-10-18 LAB — DIFFERENTIAL
Lymphocytes Relative: 20
Monocytes Absolute: 0.9 — ABNORMAL HIGH
Monocytes Relative: 7
Neutro Abs: 10.3 — ABNORMAL HIGH

## 2010-10-18 LAB — POCT I-STAT 4, (NA,K, GLUC, HGB,HCT)
Hemoglobin: 14.3
Potassium: 4

## 2010-10-18 LAB — TYPE AND SCREEN: Antibody Screen: NEGATIVE

## 2010-10-18 LAB — PROTIME-INR: INR: 1

## 2010-10-18 LAB — APTT: aPTT: 30

## 2010-10-19 ENCOUNTER — Other Ambulatory Visit: Payer: Self-pay | Admitting: Internal Medicine

## 2010-10-23 ENCOUNTER — Other Ambulatory Visit: Payer: Self-pay | Admitting: *Deleted

## 2010-10-23 DIAGNOSIS — F3289 Other specified depressive episodes: Secondary | ICD-10-CM

## 2010-10-23 DIAGNOSIS — F329 Major depressive disorder, single episode, unspecified: Secondary | ICD-10-CM

## 2010-10-23 NOTE — Telephone Encounter (Signed)
Ok to refill x 2  

## 2010-10-23 NOTE — Telephone Encounter (Signed)
Refill clonazepam 1 mg qd prn and ambien 10 mg 1 qhs #30, last refilled 09/16/09

## 2010-10-24 MED ORDER — ZOLPIDEM TARTRATE 10 MG PO TABS: 10.0000 mg | ORAL_TABLET | Freq: Every evening | ORAL | Status: DC | PRN

## 2010-10-24 MED ORDER — CLONAZEPAM 1 MG PO TABS: 1.0000 mg | ORAL_TABLET | Freq: Every day | ORAL | Status: DC | PRN

## 2010-10-24 NOTE — Telephone Encounter (Signed)
rx's called in, pt aware 

## 2011-01-05 ENCOUNTER — Ambulatory Visit: Payer: Managed Care, Other (non HMO) | Admitting: Internal Medicine

## 2011-01-07 ENCOUNTER — Ambulatory Visit (INDEPENDENT_AMBULATORY_CARE_PROVIDER_SITE_OTHER): Payer: Managed Care, Other (non HMO)

## 2011-01-07 DIAGNOSIS — M25579 Pain in unspecified ankle and joints of unspecified foot: Secondary | ICD-10-CM

## 2011-01-07 DIAGNOSIS — M5430 Sciatica, unspecified side: Secondary | ICD-10-CM

## 2011-01-07 DIAGNOSIS — M533 Sacrococcygeal disorders, not elsewhere classified: Secondary | ICD-10-CM

## 2011-01-07 DIAGNOSIS — M545 Low back pain, unspecified: Secondary | ICD-10-CM

## 2011-01-12 ENCOUNTER — Ambulatory Visit (INDEPENDENT_AMBULATORY_CARE_PROVIDER_SITE_OTHER): Payer: Managed Care, Other (non HMO) | Admitting: Internal Medicine

## 2011-01-12 ENCOUNTER — Encounter: Payer: Self-pay | Admitting: Internal Medicine

## 2011-01-12 DIAGNOSIS — R634 Abnormal weight loss: Secondary | ICD-10-CM

## 2011-01-12 DIAGNOSIS — M25559 Pain in unspecified hip: Secondary | ICD-10-CM

## 2011-01-12 DIAGNOSIS — E785 Hyperlipidemia, unspecified: Secondary | ICD-10-CM

## 2011-01-12 DIAGNOSIS — I10 Essential (primary) hypertension: Secondary | ICD-10-CM

## 2011-01-12 LAB — BASIC METABOLIC PANEL
CO2: 30 mEq/L (ref 19–32)
Calcium: 9.6 mg/dL (ref 8.4–10.5)
Chloride: 100 mEq/L (ref 96–112)
Sodium: 139 mEq/L (ref 135–145)

## 2011-01-12 LAB — SEDIMENTATION RATE: Sed Rate: 10 mm/hr (ref 0–22)

## 2011-01-12 LAB — TSH: TSH: 1.17 u[IU]/mL (ref 0.35–5.50)

## 2011-01-12 LAB — CK: Total CK: 28 U/L (ref 7–177)

## 2011-01-12 MED ORDER — TRAMADOL HCL 50 MG PO TABS: 50.0000 mg | ORAL_TABLET | Freq: Two times a day (BID) | ORAL | Status: AC | PRN

## 2011-01-12 MED ORDER — AMITRIPTYLINE HCL 10 MG PO TABS: 10.0000 mg | ORAL_TABLET | Freq: Every day | ORAL | Status: DC

## 2011-01-12 MED ORDER — AMLODIPINE BESYLATE 5 MG PO TABS: 5.0000 mg | ORAL_TABLET | Freq: Every day | ORAL | Status: DC

## 2011-01-12 NOTE — Progress Notes (Signed)
Subjective:    Patient ID: [name] [name], female    DOB: 1960-07-20, 51 y.o.   MRN: [id number]  HPI  51 year old white female with chronic hip pain for followup. Since previous office visit patient was seen by orthopedic specialist. He performed MRI of left hip.  Results not available.  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 Cortisone was injected for possible bursitis and patient reports going back for several other treatments that did not help.  She continues to have intermittent hip pain. Over the last several weeks her symptoms have been right-sided. Her symptoms are severe enough where she has difficulty with routine household chores. She describes aching sensation that radiates down the thigh.  Htn - she has not been taking amlodipine.  Review of Systems Negative for fever,  Positive for weight loss.  Poor sleep despite taking ambien at bedtime    Past Medical History  Diagnosis Date  . Hyperlipidemia   . Hypertension   . Atypical chest pain   . Depression   . Tobacco abuse   . Chronic insomnia   . PVD (peripheral vascular disease)     segmental femeropoliteal disease    History   Social History  . Marital Status: Single    Spouse Name: N/A    Number of Children: N/A  . Years of Education: N/A   Occupational History  . Not on file.   Social History Main Topics  . Smoking status: Current Everyday Smoker  . Smokeless tobacco: Not on file   Comment: 1 PPD  . Alcohol Use: Not on file  . Drug Use: Not on file  . Sexually Active: Not on file   Other Topics Concern  . Not on file   Social History Narrative   Divorced with 4 children Current Smoker 1ppd No alcoholNo illicit drug use   Occupation: Tax preparation     Past Surgical History  Procedure Date  . Wrist surgery 2005    right wrist  . Cesarean section 1990  . Nasal sinus surgery 1999  . Bunionectomy 1996    bilateral  . Cervical discectomy 06/21/2006      and fusion   . Endometrial ablation June 2010  . Tubal ligation     Family History  Problem Relation Age of Onset  . Alcohol abuse Father     deceased age 3  . Emphysema Mother     deceased age 56  . ADD / ADHD Son     No Known Allergies  Current Outpatient Prescriptions on File Prior to Visit  Medication Sig Dispense Refill  . azelastine (ASTELIN) 137 MCG/SPRAY nasal spray 2 sprays by Nasal route 2 (two) times daily. Use in each nostril as directed       . budesonide-formoterol (SYMBICORT) 160-4.5 MCG/ACT inhaler Inhale 2 puffs into the lungs 2 (two) times daily.  1 Inhaler  5  . clonazePAM (KLONOPIN) 1 MG tablet Take 1 tablet (1 mg total) by mouth daily as needed.  30 tablet  2  . fluticasone (FLONASE) 50 MCG/ACT nasal spray 2 sprays by Nasal route daily.        Marland Kitchen PARoxetine (PAXIL) 40 MG tablet TAKE 1 TABLET BY MOUTH EVERY MORNING  90 tablet  0  . simvastatin (ZOCOR) 10 MG tablet Take 10 mg by mouth every evening.        . valsartan (DIOVAN) 320 MG tablet Take 1 tablet (320 mg total) by mouth  daily.  30 tablet  5  . zolpidem (AMBIEN) 10 MG tablet Take 1 tablet (10 mg total) by mouth at bedtime as needed.  30 tablet  3    BP 162/74  Pulse 68  Temp(Src) 97.8 F (36.6 C) (Oral)  Ht 4\' 11"  (1.499 m)  Wt 104 lb (47.174 kg)  BMI 21.01 kg/m2    Objective:   Physical Exam  Constitutional: She is oriented to person, place, and time. She appears well-developed and well-nourished. No distress.  HENT:  Head: Normocephalic and atraumatic.  Cardiovascular: Normal rate, regular rhythm and normal heart sounds.   Pulmonary/Chest: Effort normal and breath sounds normal. She has no wheezes. She has no rales.  Abdominal: Soft. Bowel sounds are normal.  Musculoskeletal: Normal range of motion.       No pain with internal and external rotation of right or left hip. No pain with palpation of left and right trochanteric bursa  Neurological: She is alert and oriented to person, place,  and time.  Skin: Skin is warm and dry.  Psychiatric: She has a normal mood and affect. Her behavior is normal.       Assessment & Plan:

## 2011-01-12 NOTE — Assessment & Plan Note (Signed)
51 year old white female with intermittent hip pain. Her symptoms can be left or right-sided. She was previously referred to orthopedic specialist who performed MRI of left hip. There is report of possible arthritic changes. It did not respond to cortisone injection. I suspect her symptoms may be radiating from her lumbar spine.  Trial of low-dose tricyclic.  She had no improvement with Lyrica in the past.

## 2011-01-12 NOTE — Assessment & Plan Note (Signed)
Check thyroid studies.  Consider repeat CXR considering long standing history of tobacco use.

## 2011-01-12 NOTE — Assessment & Plan Note (Signed)
Suboptimally controlled.  Restart amlodipine in addition to diovan.

## 2011-02-07 ENCOUNTER — Ambulatory Visit: Payer: Managed Care, Other (non HMO) | Admitting: Family

## 2011-02-09 ENCOUNTER — Ambulatory Visit (INDEPENDENT_AMBULATORY_CARE_PROVIDER_SITE_OTHER): Payer: Managed Care, Other (non HMO) | Admitting: Family

## 2011-02-09 ENCOUNTER — Telehealth: Payer: Self-pay | Admitting: Family

## 2011-02-09 ENCOUNTER — Encounter: Payer: Self-pay | Admitting: Family

## 2011-02-09 ENCOUNTER — Ambulatory Visit: Payer: Managed Care, Other (non HMO) | Admitting: Family

## 2011-02-09 VITALS — BP 120/70 | Temp 97.8°F | Wt 103.0 lb

## 2011-02-09 DIAGNOSIS — F329 Major depressive disorder, single episode, unspecified: Secondary | ICD-10-CM

## 2011-02-09 DIAGNOSIS — F419 Anxiety disorder, unspecified: Secondary | ICD-10-CM

## 2011-02-09 DIAGNOSIS — F3289 Other specified depressive episodes: Secondary | ICD-10-CM

## 2011-02-09 DIAGNOSIS — G4700 Insomnia, unspecified: Secondary | ICD-10-CM

## 2011-02-09 DIAGNOSIS — I10 Essential (primary) hypertension: Secondary | ICD-10-CM

## 2011-02-09 DIAGNOSIS — F411 Generalized anxiety disorder: Secondary | ICD-10-CM

## 2011-02-09 MED ORDER — AMITRIPTYLINE HCL 25 MG PO TABS: 25.0000 mg | ORAL_TABLET | Freq: Every day | ORAL | Status: DC

## 2011-02-09 MED ORDER — ZOLPIDEM TARTRATE 10 MG PO TABS: 10.0000 mg | ORAL_TABLET | Freq: Every evening | ORAL | Status: DC | PRN

## 2011-02-09 MED ORDER — CLONAZEPAM 1 MG PO TABS: 1.0000 mg | ORAL_TABLET | Freq: Every day | ORAL | Status: DC | PRN

## 2011-02-09 MED ORDER — HYDROXYZINE PAMOATE 50 MG PO CAPS: 50.0000 mg | ORAL_CAPSULE | Freq: Three times a day (TID) | ORAL | Status: DC | PRN

## 2011-02-09 NOTE — Telephone Encounter (Signed)
Scripts phoned in °

## 2011-02-09 NOTE — Telephone Encounter (Signed)
Pt need refill on ambien and klonopin call into [facility] [address] [phone]. Pt is out of meds

## 2011-02-09 NOTE — Patient Instructions (Signed)

## 2011-02-09 NOTE — Progress Notes (Signed)
Subjective:    Patient ID: [name] [name], female    DOB: [birth year], 51 y.o.   MRN: [id number]  HPI 51 year old white female, patient of Dr. Marquis Lunch., one pack per day smoker his M. with complaints of some stress, anxious having panic attacks and headaches that have been going on for the last 3 weeks. She recently started amlodipine 5 mg for a blood pressure bleed she's having a reaction to the medication. She has a history of anxiety, depression, and insomnia. She's currently taking Elavil 10 mg daily, Paxil 40 mg, and Ambien 10 mg. She continues to have crying spells denies an excellent pulses all pulses, no thoughts of death or dying.   Review of Systems  Constitutional: Negative.   HENT: Negative.   Respiratory: Negative.   Cardiovascular: Negative.   Gastrointestinal: Negative.   Musculoskeletal: Negative.   Skin: Negative.   Neurological: Negative.   Psychiatric/Behavioral: Positive for agitation. Negative for decreased concentration. The patient is nervous/anxious.    Past Medical History  Diagnosis Date  . Hyperlipidemia   . Hypertension   . Atypical chest pain   . Depression   . Tobacco abuse   . Chronic insomnia   . PVD (peripheral vascular disease)     segmental femeropoliteal disease    History   Social History  . Marital Status: Single    Spouse Name: N/A    Number of Children: N/A  . Years of Education: N/A   Occupational History  . Not on file.   Social History Main Topics  . Smoking status: Current Everyday Smoker  . Smokeless tobacco: Not on file   Comment: 1 PPD  . Alcohol Use: Not on file  . Drug Use: Not on file  . Sexually Active: Not on file   Other Topics Concern  . Not on file   Social History Narrative   Divorced with 4 children Current Smoker 1ppd No alcoholNo illicit drug use   Occupation: Tax preparation     Past Surgical History  Procedure Date  . Wrist surgery 2005    right wrist  . Cesarean section 1990  . Nasal sinus surgery 1999   . Bunionectomy 1996    bilateral  . Cervical discectomy 06/21/2006     and fusion   . Endometrial ablation June 2010  . Tubal ligation     Family History  Problem Relation Age of Onset  . Alcohol abuse Father     deceased age 18  . Emphysema Mother     deceased age 49  . ADD / ADHD Son     No Known Allergies  Current Outpatient Prescriptions on File Prior to Visit  Medication Sig Dispense Refill  . azelastine (ASTELIN) 137 MCG/SPRAY nasal spray 2 sprays by Nasal route 2 (two) times daily. Use in each nostril as directed       . budesonide-formoterol (SYMBICORT) 160-4.5 MCG/ACT inhaler Inhale 2 puffs into the lungs 2 (two) times daily.  1 Inhaler  5  . fluticasone (FLONASE) 50 MCG/ACT nasal spray 2 sprays by Nasal route daily.        Marland Kitchen PARoxetine (PAXIL) 40 MG tablet TAKE 1 TABLET BY MOUTH EVERY MORNING  90 tablet  0  . simvastatin (ZOCOR) 10 MG tablet Take 10 mg by mouth every evening.        . valsartan (DIOVAN) 320 MG tablet Take 1 tablet (320 mg total) by mouth daily.  30 tablet  5    BP 120/70  Temp(Src) 97.8  F (36.6 C) (Oral)  Wt 103 lb (46.72 kg)chart    Objective:   Physical Exam  Constitutional: She is oriented to person, place, and time. She appears well-developed and well-nourished.  HENT:  Right Ear: External ear normal.  Left Ear: External ear normal.  Nose: Nose normal.  Mouth/Throat: Oropharynx is clear and moist.  Neck: Normal range of motion. Neck supple.  Cardiovascular: Normal heart sounds and intact distal pulses.   Pulmonary/Chest: Effort normal and breath sounds normal.  Neurological: She is alert and oriented to person, place, and time.  Skin: Skin is warm and dry.  Psychiatric: She has a normal mood and affect.       Appears anxious          Assessment & Plan:  Assessment: Insomnia, anxiety, depression, hypertension  Plan: Discontinue amlodipine 5 mg daily. Increase Elavil to 25 mg daily. When necessary Vistaril 50 mg 3 times a day as  needed when necessary anxiety. She'll continue to take the Klonopin at bedtime if necessary. Encouraged healthy diet, exercise, smoking cessation. We'll patient back for recheck in 2 weeks and sooner when necessary.

## 2011-03-01 ENCOUNTER — Other Ambulatory Visit: Payer: Self-pay | Admitting: Family

## 2011-03-09 ENCOUNTER — Encounter: Payer: Self-pay | Admitting: Internal Medicine

## 2011-03-09 ENCOUNTER — Ambulatory Visit (INDEPENDENT_AMBULATORY_CARE_PROVIDER_SITE_OTHER): Payer: Managed Care, Other (non HMO) | Admitting: Internal Medicine

## 2011-03-09 VITALS — BP 118/72 | HR 100 | Temp 98.6°F | Ht 59.0 in | Wt 108.0 lb

## 2011-03-09 DIAGNOSIS — R059 Cough, unspecified: Secondary | ICD-10-CM

## 2011-03-09 DIAGNOSIS — R05 Cough: Secondary | ICD-10-CM

## 2011-03-09 DIAGNOSIS — J209 Acute bronchitis, unspecified: Secondary | ICD-10-CM | POA: Insufficient documentation

## 2011-03-09 MED ORDER — CEFUROXIME AXETIL 500 MG PO TABS: 500.0000 mg | ORAL_TABLET | Freq: Two times a day (BID) | ORAL | Status: DC

## 2011-03-09 MED ORDER — HYDROCODONE-HOMATROPINE 5-1.5 MG/5ML PO SYRP: 5.0000 mL | ORAL_SOLUTION | Freq: Two times a day (BID) | ORAL | Status: AC | PRN

## 2011-03-09 NOTE — Assessment & Plan Note (Signed)
51 y/o female with hx of COPD presents with cough x 2 weeks.  Her chest is fairly clear on exam.  I suspect probable sinusitis.  Treat with ceftin 500 mg bid x 10 days.  Use hycodan for cough.  Pt understands not to use zolpidem or clonazepam when taking cough medication.

## 2011-03-09 NOTE — Progress Notes (Signed)
Subjective:    Patient ID: [name] [name], female    DOB: [birth year], 51 y.o.   MRN: [id number]  Cough This is a new problem. The current episode started 1 to 4 weeks ago. The problem has been gradually worsening. The problem occurs hourly. The cough is productive of sputum. Associated symptoms include shortness of breath and wheezing. Pertinent negatives include no fever or sweats. The symptoms are aggravated by lying down. She has tried rest and OTC cough suppressant for the symptoms. The treatment provided no relief. Her past medical history is significant for COPD.      Review of Systems  Constitutional: Negative for fever.  Respiratory: Positive for cough, shortness of breath and wheezing.    Past Medical History  Diagnosis Date  . Hyperlipidemia   . Hypertension   . Atypical chest pain   . Depression   . Tobacco abuse   . Chronic insomnia   . PVD (peripheral vascular disease)     segmental femeropoliteal disease    History   Social History  . Marital Status: Single    Spouse Name: N/A    Number of Children: N/A  . Years of Education: N/A   Occupational History  . Not on file.   Social History Main Topics  . Smoking status: Current Everyday Smoker  . Smokeless tobacco: Not on file   Comment: 1 PPD  . Alcohol Use: Not on file  . Drug Use: Not on file  . Sexually Active: Not on file   Other Topics Concern  . Not on file   Social History Narrative   Divorced with 4 children Current Smoker 1ppd No alcoholNo illicit drug use   Occupation: Tax preparation     Past Surgical History  Procedure Date  . Wrist surgery 2005    right wrist  . Cesarean section 1990  . Nasal sinus surgery 1999  . Bunionectomy 1996    bilateral  . Cervical discectomy 06/21/2006     and fusion   . Endometrial ablation June 2010  . Tubal ligation     Family History  Problem Relation Age of Onset  . Alcohol abuse Father     deceased age 68  . Emphysema Mother     deceased age 57   . ADD / ADHD Son     No Known Allergies  Current Outpatient Prescriptions on File Prior to Visit  Medication Sig Dispense Refill  . amitriptyline (ELAVIL) 25 MG tablet Take 1 tablet (25 mg total) by mouth at bedtime.  30 tablet  1  . azelastine (ASTELIN) 137 MCG/SPRAY nasal spray 2 sprays by Nasal route 2 (two) times daily. Use in each nostril as directed       . budesonide-formoterol (SYMBICORT) 160-4.5 MCG/ACT inhaler Inhale 2 puffs into the lungs 2 (two) times daily.  1 Inhaler  5  . clonazePAM (KLONOPIN) 1 MG tablet Take 1 tablet (1 mg total) by mouth daily as needed.  30 tablet  2  . fluticasone (FLONASE) 50 MCG/ACT nasal spray 2 sprays by Nasal route daily.        . hydrOXYzine (ATARAX/VISTARIL) 50 MG tablet TAKE 1 TABLET BY MOUTH 3 TIMES A DAY AS NEEDED FOR ANXIETY  30 tablet  0  . PARoxetine (PAXIL) 40 MG tablet TAKE 1 TABLET BY MOUTH EVERY MORNING  90 tablet  0  . simvastatin (ZOCOR) 10 MG tablet Take 10 mg by mouth every evening.        . valsartan (DIOVAN)  320 MG tablet Take 1 tablet (320 mg total) by mouth daily.  30 tablet  5  . zolpidem (AMBIEN) 10 MG tablet Take 1 tablet (10 mg total) by mouth at bedtime as needed.  30 tablet  2    BP 118/72  Pulse 100  Temp(Src) 98.6 F (37 C) (Oral)  Ht 4\' 11"  (1.499 m)  Wt 108 lb (48.988 kg)  BMI 21.81 kg/m2  SpO2 98%       Objective:   Physical Exam  Constitutional: She is oriented to person, place, and time. She appears well-developed and well-nourished.  HENT:  Head: Normocephalic and atraumatic.  Right Ear: External ear normal.  Left Ear: External ear normal.  Mouth/Throat: No oropharyngeal exudate.       Mild oropharyngeal erythema  Neck: Neck supple.       Mild left neck tenderness  Cardiovascular: Normal rate, regular rhythm and normal heart sounds.   No murmur heard. Pulmonary/Chest: Effort normal and breath sounds normal. She has no wheezes.  Neurological: She is alert and oriented to person, place, and time.    Skin: Skin is warm and dry.  Psychiatric: She has a normal mood and affect. Her behavior is normal.      Assessment & Plan:

## 2011-03-09 NOTE — Patient Instructions (Addendum)
Please call our office if your symptoms do not improve or gets worse. Gargle with warm salt water Use nasal saline several times daily as directed

## 2011-03-12 ENCOUNTER — Other Ambulatory Visit: Payer: Self-pay | Admitting: *Deleted

## 2011-03-12 ENCOUNTER — Telehealth: Payer: Self-pay | Admitting: *Deleted

## 2011-03-12 MED ORDER — PAROXETINE HCL 40 MG PO TABS: 40.0000 mg | ORAL_TABLET | Freq: Every day | ORAL | Status: DC

## 2011-03-12 NOTE — Telephone Encounter (Signed)
Per Dr. [name], cannot order a treatment in the ER without being checked, etc.  He could open up Home Health, and have them take out a nebulizer or have her come here.  Her other option is to go to the ER.  She will decide as the day goes on and see how she feels to decide what to do.

## 2011-03-12 NOTE — Telephone Encounter (Signed)
If cough is getting worse, I suggest OV

## 2011-03-12 NOTE — Telephone Encounter (Signed)
Pt's nephew was in a critical car accident and in [place].  She is worse as far as her sinusitis and bronchitis, and feels it difficult to breathe.  Asking if Dr. [name] will order a breathing treatment at the hospital for her??

## 2011-03-13 NOTE — Telephone Encounter (Signed)
Spoke to pt., and she still does not want to leave her nephew.  She will go to the ER if her cough becomes worse.

## 2011-03-14 ENCOUNTER — Encounter: Payer: Self-pay | Admitting: Internal Medicine

## 2011-03-14 ENCOUNTER — Ambulatory Visit (INDEPENDENT_AMBULATORY_CARE_PROVIDER_SITE_OTHER): Payer: Managed Care, Other (non HMO) | Admitting: Internal Medicine

## 2011-03-14 VITALS — BP 116/72 | HR 93

## 2011-03-14 DIAGNOSIS — J209 Acute bronchitis, unspecified: Secondary | ICD-10-CM

## 2011-03-14 DIAGNOSIS — J449 Chronic obstructive pulmonary disease, unspecified: Secondary | ICD-10-CM

## 2011-03-14 MED ORDER — LEVOFLOXACIN 500 MG PO TABS: 500.0000 mg | ORAL_TABLET | Freq: Every day | ORAL | Status: AC

## 2011-03-14 MED ORDER — PREDNISONE 10 MG PO TABS: ORAL_TABLET | ORAL | Status: DC

## 2011-03-14 NOTE — Progress Notes (Signed)
Subjective:    Patient ID: [name] [name], female    DOB: 10-30-1960, 51 y.o.   MRN: [id number]  HPI  51 year old female with history of COPD recently seen for cough for followup. Despite taking cefuroxime 500 mg twice daily patient has persistent worsening cough. She also complains of chest tightness and wheezing. She has been under increased stress due to her nephew being in the hospital after severe motor vehicle accident.  Review of Systems Negative for fever, mild dyspnea  Past Medical History  Diagnosis Date  . Hyperlipidemia   . Hypertension   . Atypical chest pain   . Depression   . Tobacco abuse   . Chronic insomnia   . PVD (peripheral vascular disease)     segmental femeropoliteal disease    History   Social History  . Marital Status: Single    Spouse Name: N/A    Number of Children: N/A  . Years of Education: N/A   Occupational History  . Not on file.   Social History Main Topics  . Smoking status: Current Everyday Smoker  . Smokeless tobacco: Not on file   Comment: 1 PPD  . Alcohol Use: Not on file  . Drug Use: Not on file  . Sexually Active: Not on file   Other Topics Concern  . Not on file   Social History Narrative   Divorced with 4 children Current Smoker 1ppd No alcoholNo illicit drug use   Occupation: Tax preparation     Past Surgical History  Procedure Date  . Wrist surgery 2005    right wrist  . Cesarean section 1990  . Nasal sinus surgery 1999  . Bunionectomy 1996    bilateral  . Cervical discectomy 06/21/2006     and fusion   . Endometrial ablation June 2010  . Tubal ligation     Family History  Problem Relation Age of Onset  . Alcohol abuse Father     deceased age 14  . Emphysema Mother     deceased age 32  . ADD / ADHD Son     No Known Allergies  Current Outpatient Prescriptions on File Prior to Visit  Medication Sig Dispense Refill  . amitriptyline (ELAVIL) 25 MG tablet Take 1 tablet (25 mg total) by mouth at bedtime.   30 tablet  1  . azelastine (ASTELIN) 137 MCG/SPRAY nasal spray 2 sprays by Nasal route 2 (two) times daily. Use in each nostril as directed       . budesonide-formoterol (SYMBICORT) 160-4.5 MCG/ACT inhaler Inhale 2 puffs into the lungs 2 (two) times daily.  1 Inhaler  5  . clonazePAM (KLONOPIN) 1 MG tablet Take 1 tablet (1 mg total) by mouth daily as needed.  30 tablet  2  . fluticasone (FLONASE) 50 MCG/ACT nasal spray 2 sprays by Nasal route daily.        Marland Kitchen HYDROcodone-homatropine (HYCODAN) 5-1.5 MG/5ML syrup Take 5 mLs by mouth every 12 (twelve) hours as needed for cough.  120 mL  0  . hydrOXYzine (ATARAX/VISTARIL) 50 MG tablet TAKE 1 TABLET BY MOUTH 3 TIMES A DAY AS NEEDED FOR ANXIETY  30 tablet  0  . PARoxetine (PAXIL) 40 MG tablet Take 1 tablet (40 mg total) by mouth daily.  90 tablet  1  . simvastatin (ZOCOR) 10 MG tablet Take 10 mg by mouth every evening.        . valsartan (DIOVAN) 320 MG tablet Take 1 tablet (320 mg total) by mouth daily.  30 tablet  5  . zolpidem (AMBIEN) 10 MG tablet Take 1 tablet (10 mg total) by mouth at bedtime as needed.  30 tablet  2    BP 116/72  Pulse 93  SpO2 98%       Objective:   Physical Exam  Constitutional: She is oriented to person, place, and time. She appears well-developed and well-nourished.  HENT:  Right Ear: External ear normal.  Left Ear: External ear normal.       Oropharyngeal erythema  Cardiovascular: Normal rate, regular rhythm and normal heart sounds.   Pulmonary/Chest: Effort normal. She has wheezes. She has no rales.  Neurological: She is alert and oriented to person, place, and time.  Psychiatric: She has a normal mood and affect. Her behavior is normal.       Assessment & Plan:

## 2011-03-14 NOTE — Patient Instructions (Signed)
Our office will contact you re: arrange home nebulizer treatment Please call our office if your symptoms do not improve or gets worse.

## 2011-03-14 NOTE — Assessment & Plan Note (Signed)
51 year old white female with history of COPD with progressive cough despite taking Ceftin 500 mg twice a day. She has progressed to bronchitis. Switch to Levaquin 500 mg daily x10 days. Also take prednisone taper as directed. Arrange home nebulizer treatments for DuoNeb 4 times a day.

## 2011-03-15 ENCOUNTER — Telehealth: Payer: Self-pay

## 2011-03-15 NOTE — Telephone Encounter (Signed)
Talbert Forest called from [facility] and states that the pt called and stated that a nebulizer was ordered.  Per Talbert Forest no order has been received for a nebulizer.  Pls call and let Talbert Forest or the pt know where the order was sent.  Pls advise.

## 2011-03-16 ENCOUNTER — Telehealth: Payer: Self-pay

## 2011-03-16 MED ORDER — IPRATROPIUM-ALBUTEROL 0.5-2.5 (3) MG/3ML IN SOLN: 3.0000 mL | Freq: Four times a day (QID) | RESPIRATORY_TRACT | Status: DC | PRN

## 2011-03-16 NOTE — Telephone Encounter (Signed)
Terri, did this go to [facility]?

## 2011-03-16 NOTE — Telephone Encounter (Signed)
Yes.  This originally went to [facility], but they do not accept this pt's insurance - so they faxed it to [facility].

## 2011-03-16 NOTE — Telephone Encounter (Signed)
rx called in, pt aware 

## 2011-03-16 NOTE — Telephone Encounter (Signed)
Triage VM:  Pt called wanted to know where she needs to pick up the medication for her nebulizer machine.  Pt states she called the pharmacy and it is not there.  Pls advise.

## 2011-04-09 ENCOUNTER — Other Ambulatory Visit: Payer: Self-pay | Admitting: Family

## 2011-05-03 ENCOUNTER — Other Ambulatory Visit: Payer: Self-pay | Admitting: *Deleted

## 2011-05-03 MED ORDER — ZOLPIDEM TARTRATE 10 MG PO TABS: 10.0000 mg | ORAL_TABLET | Freq: Every evening | ORAL | Status: DC | PRN

## 2011-05-03 NOTE — Telephone Encounter (Signed)
Zolpidem 10mg #30 x no refills left on pharmacy voicemail.  

## 2011-05-30 ENCOUNTER — Other Ambulatory Visit: Payer: Self-pay | Admitting: *Deleted

## 2011-05-30 MED ORDER — ZOLPIDEM TARTRATE 10 MG PO TABS: 10.0000 mg | ORAL_TABLET | Freq: Every evening | ORAL | Status: DC | PRN

## 2011-05-31 ENCOUNTER — Telehealth: Payer: Self-pay

## 2011-05-31 DIAGNOSIS — F329 Major depressive disorder, single episode, unspecified: Secondary | ICD-10-CM

## 2011-05-31 DIAGNOSIS — F3289 Other specified depressive episodes: Secondary | ICD-10-CM

## 2011-05-31 NOTE — Telephone Encounter (Signed)
[name] pt

## 2011-06-04 ENCOUNTER — Other Ambulatory Visit: Payer: Self-pay | Admitting: *Deleted

## 2011-06-04 DIAGNOSIS — F3289 Other specified depressive episodes: Secondary | ICD-10-CM

## 2011-06-04 DIAGNOSIS — F329 Major depressive disorder, single episode, unspecified: Secondary | ICD-10-CM

## 2011-06-04 MED ORDER — CLONAZEPAM 1 MG PO TABS: 1.0000 mg | ORAL_TABLET | Freq: Every day | ORAL | Status: DC | PRN

## 2011-06-04 NOTE — Telephone Encounter (Signed)
rx called in

## 2011-06-04 NOTE — Telephone Encounter (Signed)
[facility] calling for refill of Klonopin.

## 2011-06-04 NOTE — Telephone Encounter (Signed)
Ok to refill x 3 

## 2011-08-10 ENCOUNTER — Ambulatory Visit (INDEPENDENT_AMBULATORY_CARE_PROVIDER_SITE_OTHER): Payer: Managed Care, Other (non HMO) | Admitting: Family

## 2011-08-10 ENCOUNTER — Encounter: Payer: Self-pay | Admitting: Family

## 2011-08-10 VITALS — BP 120/76 | HR 88 | Wt 105.0 lb

## 2011-08-10 DIAGNOSIS — IMO0002 Reserved for concepts with insufficient information to code with codable children: Secondary | ICD-10-CM

## 2011-08-10 DIAGNOSIS — M545 Low back pain, unspecified: Secondary | ICD-10-CM

## 2011-08-10 DIAGNOSIS — M5416 Radiculopathy, lumbar region: Secondary | ICD-10-CM

## 2011-08-10 MED ORDER — KETOROLAC TROMETHAMINE 60 MG/2ML IM SOLN
60.0000 mg | Freq: Once | INTRAMUSCULAR | Status: AC
  Administered 2011-08-10: 60 mg via INTRAMUSCULAR

## 2011-08-10 MED ORDER — HYDROCODONE-ACETAMINOPHEN 5-500 MG PO TABS: 1.0000 | ORAL_TABLET | Freq: Three times a day (TID) | ORAL | Status: DC | PRN

## 2011-08-10 MED ORDER — CYCLOBENZAPRINE HCL 10 MG PO TABS: 10.0000 mg | ORAL_TABLET | Freq: Three times a day (TID) | ORAL | Status: DC | PRN

## 2011-08-10 NOTE — Progress Notes (Signed)
Subjective:    Patient ID: [name] [name], female    DOB: May 26, 1960, 51 y.o.   MRN: [id number]  HPI 51 year old white female, smoker, patient of Dr. Marquis Lunch. is in today with complaints of back pain x3 weeks. She's been taken over-the-counter ibuprofen with no relief. Rates the pain heat out of 10, describes it as burning and achy. Pain is worse with sitting. Better with walking. She has a history of low back pain. Has had an MRI that was essentially negative.   Review of Systems  Constitutional: Negative.   HENT: Negative.   Respiratory: Negative.   Cardiovascular: Negative.   Genitourinary: Negative for urgency, frequency and hematuria.  Musculoskeletal: Positive for back pain.  Skin: Negative.   Neurological: Negative.   Hematological: Negative.   Psychiatric/Behavioral: Negative.    Past Medical History  Diagnosis Date  . Hyperlipidemia   . Hypertension   . Atypical chest pain   . Depression   . Tobacco abuse   . Chronic insomnia   . PVD (peripheral vascular disease)     segmental femeropoliteal disease    History   Social History  . Marital Status: Single    Spouse Name: N/A    Number of Children: N/A  . Years of Education: N/A   Occupational History  . Not on file.   Social History Main Topics  . Smoking status: Current Everyday Smoker  . Smokeless tobacco: Not on file   Comment: 1 PPD  . Alcohol Use: Not on file  . Drug Use: Not on file  . Sexually Active: Not on file   Other Topics Concern  . Not on file   Social History Narrative   Divorced with 4 children Current Smoker 1ppd No alcoholNo illicit drug use   Occupation: Tax preparation     Past Surgical History  Procedure Date  . Wrist surgery 2005    right wrist  . Cesarean section 1990  . Nasal sinus surgery 1999  . Bunionectomy 1996    bilateral  . Cervical discectomy 06/21/2006     and fusion   . Endometrial ablation June 2010  . Tubal ligation     Family History  Problem Relation Age of  Onset  . Alcohol abuse Father     deceased age 58  . Emphysema Mother     deceased age 4  . ADD / ADHD Son     No Known Allergies  Current Outpatient Prescriptions on File Prior to Visit  Medication Sig Dispense Refill  . clonazePAM (KLONOPIN) 1 MG tablet Take 1 tablet (1 mg total) by mouth daily as needed.  30 tablet  3  . PARoxetine (PAXIL) 40 MG tablet Take 1 tablet (40 mg total) by mouth daily.  90 tablet  1  . simvastatin (ZOCOR) 10 MG tablet Take 10 mg by mouth every evening.        . zolpidem (AMBIEN) 10 MG tablet Take 1 tablet (10 mg total) by mouth at bedtime as needed.  30 tablet  3  . amitriptyline (ELAVIL) 25 MG tablet Take 1 tablet (25 mg total) by mouth at bedtime.  30 tablet  1  . azelastine (ASTELIN) 137 MCG/SPRAY nasal spray 2 sprays by Nasal route 2 (two) times daily. Use in each nostril as directed       . budesonide-formoterol (SYMBICORT) 160-4.5 MCG/ACT inhaler Inhale 2 puffs into the lungs 2 (two) times daily.  1 Inhaler  5  . fluticasone (FLONASE) 50 MCG/ACT nasal spray 2  sprays by Nasal route daily.        . hydrOXYzine (ATARAX/VISTARIL) 50 MG tablet TAKE 1 TABLET BY MOUTH 3 TIMES A DAY AS NEEDED FOR ANXIETY  30 tablet  0  . hydrOXYzine (ATARAX/VISTARIL) 50 MG tablet TAKE 1 TABLET BY MOUTH 3 TIMES A DAY AS NEEDED FOR ANXIETY  30 tablet  0  . ipratropium-albuterol (DUONEB) 0.5-2.5 (3) MG/3ML SOLN Take 3 mLs by nebulization 4 (four) times daily as needed.  360 mL  11  . predniSONE (DELTASONE) 10 MG tablet 3 tabs by mouth for 3 days, then 2 days daily for 3 days, then 1 tab daily x 3 days  18 tablet  0  . valsartan (DIOVAN) 320 MG tablet Take 1 tablet (320 mg total) by mouth daily.  30 tablet  5   No current facility-administered medications on file prior to visit.    BP 120/76  Pulse 88  Wt 105 lb (47.628 kg)  SpO2 98%chart    Objective:   Physical Exam  Constitutional: She appears well-developed and well-nourished.  Neck: Normal range of motion. Neck  supple.  Cardiovascular: Normal rate, regular rhythm and normal heart sounds.   Pulmonary/Chest: Effort normal and breath sounds normal.  Abdominal: Soft. Bowel sounds are normal.  Musculoskeletal:       Tenderness to palpation of the lower L-spine radiating to the right lower back. Negative straight leg raise. 180 flexion at the hip. Pain with lateral rotation.  Neurological: She is alert.  Skin: Skin is warm and dry.  Psychiatric: She has a normal mood and affect.     Toradol 60 mg IM x1 given.    Assessment & Plan:  Assessment: Low back pain, lumbar radiculopathy  Plan: Flexeril 10 mg 3 times a day. When necessary Vicodin one tablet every 8 hours when necessary pain. Rest. Patient call the office symptoms worsen or persist. Recheck as scheduled, when necessary.

## 2011-08-10 NOTE — Patient Instructions (Signed)
Back Exercises Back exercises help treat and prevent back injuries. The goal of back exercises is to increase the strength of your abdominal and back muscles and the flexibility of your back. These exercises should be started when you no longer have back pain. Back exercises include:  Pelvic Tilt. Lie on your back with your knees bent. Tilt your pelvis until the lower part of your back is against the floor. Hold this position 5 to 10 sec and repeat 5 to 10 times.   Knee to Chest. Pull first 1 knee up against your chest and hold for 20 to 30 seconds, repeat this with the other knee, and then both knees. This may be done with the other leg straight or bent, whichever feels better.   Sit-Ups or Curl-Ups. Bend your knees 90 degrees. Start with tilting your pelvis, and do a partial, slow sit-up, lifting your trunk only 30 to 45 degrees off the floor. Take at least 2 to 3 seconds for each sit-up. Do not do sit-ups with your knees out straight. If partial sit-ups are difficult, simply do the above but with only tightening your abdominal muscles and holding it as directed.   Hip-Lift. Lie on your back with your knees flexed 90 degrees. Push down with your feet and shoulders as you raise your hips a couple inches off the floor; hold for 10 seconds, repeat 5 to 10 times.   Back arches. Lie on your stomach, propping yourself up on bent elbows. Slowly press on your hands, causing an arch in your low back. Repeat 3 to 5 times. Any initial stiffness and discomfort should lessen with repetition over time.   Shoulder-Lifts. Lie face down with arms beside your body. Keep hips and torso pressed to floor as you slowly lift your head and shoulders off the floor.  Do not overdo your exercises, especially in the beginning. Exercises may cause you some mild back discomfort which lasts for a few minutes; however, if the pain is more severe, or lasts for more than 15 minutes, do not continue exercises until you see your  caregiver. Improvement with exercise therapy for back problems is slow.  See your caregivers for assistance with developing a proper back exercise program. Document Released: 01/26/2004 Document Revised: 12/07/2010 Document Reviewed: 12/18/2004 [employer] Patient Information 2012 [employer]. 

## 2011-08-13 ENCOUNTER — Telehealth: Payer: Self-pay | Admitting: Internal Medicine

## 2011-08-13 DIAGNOSIS — M25519 Pain in unspecified shoulder: Secondary | ICD-10-CM

## 2011-08-13 DIAGNOSIS — M549 Dorsalgia, unspecified: Secondary | ICD-10-CM

## 2011-08-13 NOTE — Telephone Encounter (Signed)
Caller: [name]/Patient; Patient Name: [name] [name]; PCP: Thomos Lemons; Best Callback Phone Number: [phone].  Onset of Back Pain approx 4 wks ago. Pt seen and evalutated 08/10/11. She is getting no relief from the pain medication or muscle relaxer.  The back pain wakes pt up several times throughout the night.  All emergent symptoms ruled our per Back Symptoms with exception to 'Evaluated by provider and no improvement after following treatment plan for the time specified by the provider.  See Provider within 72 hrs. I'm unable to schedule future appt. Pt will call back 08/14/2011 to schedule an appt.

## 2011-08-17 ENCOUNTER — Encounter: Payer: Self-pay | Admitting: Internal Medicine

## 2011-08-17 ENCOUNTER — Ambulatory Visit (INDEPENDENT_AMBULATORY_CARE_PROVIDER_SITE_OTHER): Payer: Managed Care, Other (non HMO) | Admitting: Internal Medicine

## 2011-08-17 VITALS — BP 132/80 | Temp 98.2°F | Wt 103.0 lb

## 2011-08-17 DIAGNOSIS — M545 Low back pain, unspecified: Secondary | ICD-10-CM

## 2011-08-17 DIAGNOSIS — G8929 Other chronic pain: Secondary | ICD-10-CM | POA: Insufficient documentation

## 2011-08-17 MED ORDER — TRAMADOL HCL 50 MG PO TABS: 50.0000 mg | ORAL_TABLET | Freq: Three times a day (TID) | ORAL | Status: AC | PRN

## 2011-08-17 MED ORDER — GABAPENTIN 100 MG PO CAPS: ORAL_CAPSULE | ORAL | Status: DC

## 2011-08-17 MED ORDER — PREDNISONE 20 MG PO TABS: ORAL_TABLET | ORAL | Status: DC

## 2011-08-17 NOTE — Assessment & Plan Note (Signed)
51 year-old with chronic low back pain. Her symptoms are right-sided. I suspect symptoms are secondary to lumbar spondylosis. Treat with prednisone taper. Trial of gabapentin 100 to 200 mg once daily.

## 2011-08-17 NOTE — Progress Notes (Signed)
Subjective:    Patient ID: [name] [name], female    DOB: [birth year], 51 y.o.   MRN: [id number]  HPI  51 year old white female with history of hypertension, COPD and ongoing tobacco abuse complains of persistent right-sided low back pain. She was seen one to 2 weeks ago by nurse practitioner. She was prescribed Flexeril 10 mg 3 times a day and Vicodin as needed. Despite medications, patient is having persistent acute pain. She describes stabbing sensation. It does not radiate into her leg. Symptoms are worse with sitting, standing or walking.  She was previously seen by [facility] orthopedics for low back pain. MRI of lumbar spine negative for significant radiculopathy. Symptoms felt to be secondary to arthritis.  Review of Systems Negative for fever or urinary symptoms  Past Medical History  Diagnosis Date  . Hyperlipidemia   . Hypertension   . Atypical chest pain   . Depression   . Tobacco abuse   . Chronic insomnia   . PVD (peripheral vascular disease)     segmental femeropoliteal disease    History   Social History  . Marital Status: Single    Spouse Name: N/A    Number of Children: N/A  . Years of Education: N/A   Occupational History  . Not on file.   Social History Main Topics  . Smoking status: Current Everyday Smoker  . Smokeless tobacco: Not on file   Comment: 1 PPD  . Alcohol Use: Not on file  . Drug Use: Not on file  . Sexually Active: Not on file   Other Topics Concern  . Not on file   Social History Narrative   Divorced with 4 children Current Smoker 1ppd No alcoholNo illicit drug use   Occupation: Tax preparation     Past Surgical History  Procedure Date  . Wrist surgery 2005    right wrist  . Cesarean section 1990  . Nasal sinus surgery 1999  . Bunionectomy 1996    bilateral  . Cervical discectomy 06/21/2006     and fusion   . Endometrial ablation June 2010  . Tubal ligation     Family History  Problem Relation Age of Onset  . Alcohol  abuse Father     deceased age 61  . Emphysema Mother     deceased age 22  . ADD / ADHD Son     No Known Allergies  Current Outpatient Prescriptions on File Prior to Visit  Medication Sig Dispense Refill  . amitriptyline (ELAVIL) 25 MG tablet Take 1 tablet (25 mg total) by mouth at bedtime.  30 tablet  1  . azelastine (ASTELIN) 137 MCG/SPRAY nasal spray 2 sprays by Nasal route 2 (two) times daily. Use in each nostril as directed       . clonazePAM (KLONOPIN) 1 MG tablet Take 1 tablet (1 mg total) by mouth daily as needed.  30 tablet  3  . fluticasone (FLONASE) 50 MCG/ACT nasal spray 2 sprays by Nasal route daily.        Marland Kitchen ipratropium-albuterol (DUONEB) 0.5-2.5 (3) MG/3ML SOLN Take 3 mLs by nebulization 4 (four) times daily as needed.  360 mL  11  . PARoxetine (PAXIL) 40 MG tablet Take 1 tablet (40 mg total) by mouth daily.  90 tablet  1  . simvastatin (ZOCOR) 10 MG tablet Take 10 mg by mouth every evening.        . zolpidem (AMBIEN) 10 MG tablet Take 1 tablet (10 mg total) by mouth at bedtime  as needed.  30 tablet  3  . DISCONTD: budesonide-formoterol (SYMBICORT) 160-4.5 MCG/ACT inhaler Inhale 2 puffs into the lungs 2 (two) times daily.  1 Inhaler  5  . DISCONTD: valsartan (DIOVAN) 320 MG tablet Take 1 tablet (320 mg total) by mouth daily.  30 tablet  5  . gabapentin (NEURONTIN) 100 MG capsule Take one capsule at bedtime for 7 days then take two capsules at bedtime  60 capsule  2    BP 132/80  Temp 98.2 F (36.8 C) (Oral)  Wt 103 lb (46.72 kg)       Objective:   Physical Exam  Constitutional: She appears well-developed and well-nourished.  Cardiovascular: Normal rate, regular rhythm and normal heart sounds.   Pulmonary/Chest: Breath sounds normal. She has no wheezes.  Musculoskeletal:       Tenderness of right sacroiliac joint          Assessment & Plan:

## 2011-08-24 ENCOUNTER — Ambulatory Visit (INDEPENDENT_AMBULATORY_CARE_PROVIDER_SITE_OTHER): Payer: Managed Care, Other (non HMO) | Admitting: Internal Medicine

## 2011-08-24 ENCOUNTER — Encounter: Payer: Self-pay | Admitting: Internal Medicine

## 2011-08-24 VITALS — BP 122/72 | Temp 98.0°F | Wt 107.0 lb

## 2011-08-24 DIAGNOSIS — M545 Low back pain, unspecified: Secondary | ICD-10-CM

## 2011-08-24 MED ORDER — DICLOFENAC EPOLAMINE 1.3 % TD PTCH: 1.0000 | MEDICATED_PATCH | Freq: Two times a day (BID) | TRANSDERMAL | Status: DC

## 2011-08-24 MED ORDER — MOMETASONE FUROATE 50 MCG/ACT NA SUSP: 2.0000 | Freq: Every day | NASAL | Status: DC

## 2011-08-24 NOTE — Assessment & Plan Note (Signed)
Poor response to prednisone taper and gabapentin.  Discontinue gabapentin.  Trial of voltaren patch.

## 2011-08-24 NOTE — Progress Notes (Signed)
Subjective:    Patient ID: [name] [name], female    DOB: 05-15-1960, 51 y.o.   MRN: [id number]  HPI   51 year old white female for followup regarding chronic back and hip pain. Minimal improvement of low back pain with prednisone taper and gabapentin. Patient reports right hip discomfort has improved.   Patient continues to have stiffness with prolonged sitting.  Her back bothers her especially when she changes positions.   Review of Systems Negative for weakness  Past Medical History  Diagnosis Date  . Hyperlipidemia   . Hypertension   . Atypical chest pain   . Depression   . Tobacco abuse   . Chronic insomnia   . PVD (peripheral vascular disease)     segmental femeropoliteal disease    History   Social History  . Marital Status: Single    Spouse Name: N/A    Number of Children: N/A  . Years of Education: N/A   Occupational History  . Not on file.   Social History Main Topics  . Smoking status: Current Everyday Smoker  . Smokeless tobacco: Not on file   Comment: 1 PPD  . Alcohol Use: Not on file  . Drug Use: Not on file  . Sexually Active: Not on file   Other Topics Concern  . Not on file   Social History Narrative   Divorced with 4 children Current Smoker 1ppd No alcoholNo illicit drug use   Occupation: Tax preparation     Past Surgical History  Procedure Date  . Wrist surgery 2005    right wrist  . Cesarean section 1990  . Nasal sinus surgery 1999  . Bunionectomy 1996    bilateral  . Cervical discectomy 06/21/2006     and fusion   . Endometrial ablation June 2010  . Tubal ligation     Family History  Problem Relation Age of Onset  . Alcohol abuse Father     deceased age 35  . Emphysema Mother     deceased age 69  . ADD / ADHD Son     No Known Allergies  Current Outpatient Prescriptions on File Prior to Visit  Medication Sig Dispense Refill  . amitriptyline (ELAVIL) 25 MG tablet Take 1 tablet (25 mg total) by mouth at bedtime.  30 tablet   1  . azelastine (ASTELIN) 137 MCG/SPRAY nasal spray 2 sprays by Nasal route 2 (two) times daily. Use in each nostril as directed       . budesonide-formoterol (SYMBICORT) 160-4.5 MCG/ACT inhaler Inhale 2 puffs into the lungs 2 (two) times daily.      . clonazePAM (KLONOPIN) 1 MG tablet Take 1 tablet (1 mg total) by mouth daily as needed.  30 tablet  3  . fluticasone (FLONASE) 50 MCG/ACT nasal spray 2 sprays by Nasal route daily.        Marland Kitchen ipratropium-albuterol (DUONEB) 0.5-2.5 (3) MG/3ML SOLN Take 3 mLs by nebulization 4 (four) times daily as needed.  360 mL  11  . PARoxetine (PAXIL) 40 MG tablet Take 1 tablet (40 mg total) by mouth daily.  90 tablet  1  . predniSONE (DELTASONE) 20 MG tablet One tablet every 12 hrs for 3 days, then once daily for 3 days, then 1/2 tablet for 4 days  11 tablet  0  . simvastatin (ZOCOR) 10 MG tablet Take 10 mg by mouth every evening.        . traMADol (ULTRAM) 50 MG tablet Take 1 tablet (50 mg total) by  mouth every 8 (eight) hours as needed for pain.  30 tablet  0  . valsartan (DIOVAN) 320 MG tablet Take 320 mg by mouth daily.      Marland Kitchen zolpidem (AMBIEN) 10 MG tablet Take 1 tablet (10 mg total) by mouth at bedtime as needed.  30 tablet  3  . mometasone (NASONEX) 50 MCG/ACT nasal spray Place 2 sprays into the nose daily.  17 g  3    BP 122/72  Temp 98 F (36.7 C) (Oral)  Wt 107 lb (48.535 kg)       Objective:   Physical Exam  Constitutional: She is oriented to person, place, and time.       Thin, pleasant 51 y/o female  Cardiovascular: Normal rate, regular rhythm and normal heart sounds.   Pulmonary/Chest: Effort normal and breath sounds normal. She has no wheezes.  Neurological: She is alert and oriented to person, place, and time. Coordination normal.  Skin: Skin is warm and dry.          Assessment & Plan:

## 2011-08-31 ENCOUNTER — Ambulatory Visit: Payer: Managed Care, Other (non HMO) | Admitting: Internal Medicine

## 2011-09-06 ENCOUNTER — Telehealth: Payer: Self-pay | Admitting: Internal Medicine

## 2011-09-06 DIAGNOSIS — M25519 Pain in unspecified shoulder: Secondary | ICD-10-CM

## 2011-09-06 NOTE — Telephone Encounter (Signed)
Patient called stating that she would like to be referred to pain management for her back arm and shoulder pain and would like to be seen on 09-10-11 if possible if not she understands. Please assist/advise.

## 2011-09-06 NOTE — Telephone Encounter (Signed)
Referral order placed.

## 2011-09-06 NOTE — Telephone Encounter (Signed)
Please order referral to pain mgt (whoever is first available)

## 2011-09-10 ENCOUNTER — Telehealth: Payer: Self-pay | Admitting: Internal Medicine

## 2011-09-10 MED ORDER — HYDROCODONE-ACETAMINOPHEN 5-500 MG PO CAPS: 1.0000 | ORAL_CAPSULE | Freq: Three times a day (TID) | ORAL | Status: AC | PRN

## 2011-09-10 NOTE — Telephone Encounter (Signed)
rx sent in electronically, pt aware 

## 2011-09-10 NOTE — Telephone Encounter (Signed)
Call in vicodin 5/500 # 60.  One tab q 8 hrs as needed for pain.  One refill

## 2011-09-10 NOTE — Telephone Encounter (Signed)
Caller: [name]/Patient; Patient Name: [name] [name]; PCP: [name], Doe-Hyun Molly Maduro) (Adults only); Best Callback Phone Number: [phone]; Call regarding Right Shoulder and Armpit Pain, onset years.  Awaiting Pain Clinic appointment.  Patient requesting pain medication until she can be seen at Pain Clinic.  Per EPIC, Patient was seen on 8-9, 8-16 and 8-23 for same related pain.  Patient has taken Ibuprofen 800mg  and used heating pad, no improvement. All emergent symptoms ruled out per Shoulder non-injury protocol, see in 24 hours.  Patient uses [facility], [phone].  Please follow up with Patient.

## 2011-09-20 ENCOUNTER — Encounter: Payer: Self-pay | Admitting: Physical Medicine & Rehabilitation

## 2011-09-27 ENCOUNTER — Other Ambulatory Visit: Payer: Self-pay | Admitting: Internal Medicine

## 2011-09-27 ENCOUNTER — Other Ambulatory Visit: Payer: Self-pay | Admitting: Family

## 2011-09-28 ENCOUNTER — Ambulatory Visit: Payer: Managed Care, Other (non HMO) | Admitting: Physical Medicine & Rehabilitation

## 2011-09-28 ENCOUNTER — Other Ambulatory Visit: Payer: Self-pay | Admitting: Internal Medicine

## 2011-09-28 ENCOUNTER — Encounter: Payer: Managed Care, Other (non HMO) | Attending: Physical Medicine & Rehabilitation

## 2011-09-28 ENCOUNTER — Ambulatory Visit (HOSPITAL_BASED_OUTPATIENT_CLINIC_OR_DEPARTMENT_OTHER): Payer: Managed Care, Other (non HMO) | Admitting: Physical Medicine & Rehabilitation

## 2011-09-28 ENCOUNTER — Encounter: Payer: Self-pay | Admitting: Physical Medicine & Rehabilitation

## 2011-09-28 VITALS — BP 141/72 | HR 100 | Resp 14 | Ht 59.0 in | Wt 105.0 lb

## 2011-09-28 DIAGNOSIS — M7712 Lateral epicondylitis, left elbow: Secondary | ICD-10-CM

## 2011-09-28 DIAGNOSIS — M545 Low back pain, unspecified: Secondary | ICD-10-CM | POA: Insufficient documentation

## 2011-09-28 DIAGNOSIS — M25519 Pain in unspecified shoulder: Secondary | ICD-10-CM | POA: Insufficient documentation

## 2011-09-28 DIAGNOSIS — M961 Postlaminectomy syndrome, not elsewhere classified: Secondary | ICD-10-CM | POA: Insufficient documentation

## 2011-09-28 DIAGNOSIS — M25559 Pain in unspecified hip: Secondary | ICD-10-CM | POA: Insufficient documentation

## 2011-09-28 DIAGNOSIS — M7710 Lateral epicondylitis, unspecified elbow: Secondary | ICD-10-CM | POA: Insufficient documentation

## 2011-09-28 DIAGNOSIS — M533 Sacrococcygeal disorders, not elsewhere classified: Secondary | ICD-10-CM | POA: Insufficient documentation

## 2011-09-28 DIAGNOSIS — M129 Arthropathy, unspecified: Secondary | ICD-10-CM | POA: Insufficient documentation

## 2011-09-28 DIAGNOSIS — Z5181 Encounter for therapeutic drug level monitoring: Secondary | ICD-10-CM

## 2011-09-28 DIAGNOSIS — IMO0001 Reserved for inherently not codable concepts without codable children: Secondary | ICD-10-CM | POA: Insufficient documentation

## 2011-09-28 DIAGNOSIS — G8929 Other chronic pain: Secondary | ICD-10-CM

## 2011-09-28 MED ORDER — SIMVASTATIN 10 MG PO TABS: 10.0000 mg | ORAL_TABLET | Freq: Every evening | ORAL | Status: DC

## 2011-09-28 MED ORDER — PAROXETINE HCL 40 MG PO TABS: 20.0000 mg | ORAL_TABLET | Freq: Every day | ORAL | Status: DC

## 2011-09-28 MED ORDER — DICLOFENAC SODIUM 1 % TD GEL: 2.0000 g | Freq: Four times a day (QID) | TRANSDERMAL | Status: DC

## 2011-09-28 MED ORDER — DULOXETINE HCL 20 MG PO CPEP: 20.0000 mg | ORAL_CAPSULE | Freq: Every day | ORAL | Status: DC

## 2011-09-28 NOTE — Progress Notes (Signed)
  Subjective:    Patient ID: [name] [name], female    DOB: [birth year], 51 y.o.   MRN: [id number]  HPI  Pain Inventory Average Pain 8 Pain Right Now 9 My pain is burning, stabbing and aching  In the last 24 hours, has pain interfered with the following? General activity 7 Relation with others 8 Enjoyment of life 9 What TIME of day is your pain at its worst? morning ,daytime and night Sleep (in general) Fair  Pain is worse with: walking, bending, sitting, standing and some activites Pain improves with: medication Relief from Meds: 6  Mobility walk without assistance how many minutes can you walk? 30 ability to climb steps?  yes do you drive?  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  no  Physicians involved in your care Any changes since last visit?  no   Family History  Problem Relation Age of Onset  . Alcohol abuse Father     deceased age 45  . Emphysema Mother     deceased age 34  . ADD / ADHD Son    History   Social History  . Marital Status: Single    Spouse Name: N/A    Number of Children: N/A  . Years of Education: N/A   Social History Main Topics  . Smoking status: Current Every Day Smoker  . Smokeless tobacco: Never Used   Comment: 1 PPD  . Alcohol Use: None  . Drug Use: None  . Sexually Active: None   Other Topics Concern  . None   Social History Narrative   Divorced with 4 children Current Smoker 1ppd No alcoholNo illicit drug use   Occupation: Tax preparation    Past Surgical History  Procedure Date  . Wrist surgery 2005    right wrist  . Cesarean section 1990  . Nasal sinus surgery 1999  . Bunionectomy 1996    bilateral  . Cervical discectomy 06/21/2006     and fusion   . Endometrial ablation June 2010  . Tubal ligation    Past Medical History  Diagnosis Date  .  Hyperlipidemia   . Hypertension   . Atypical chest pain   . Depression   . Tobacco abuse   . Chronic insomnia   . PVD (peripheral vascular disease)     segmental femeropoliteal disease   BP 141/72  Pulse 100  Resp 14  Ht 4\' 11"  (1.499 m)  Wt 105 lb (47.628 kg)  BMI 21.21 kg/m2  SpO2 98%    Review of Systems  Gastrointestinal: Positive for constipation.  Musculoskeletal: Positive for back pain.       Elbow, and hip pain, spasms  Psychiatric/Behavioral: Positive for dysphoric mood.  All other systems reviewed and are negative.       Objective:   Physical Exam        Assessment & Plan:

## 2011-09-28 NOTE — Patient Instructions (Signed)
Take one half of a Paxil every day for the next 2 weeks Start Cymbalta 20 mg a day in one week See me for sacroiliac injection in 2 weeks

## 2011-09-28 NOTE — Telephone Encounter (Signed)
[name] [name], this came to my desktop. Dr [name] pt. Not sure why sent to me, but if needs Prior Auth, let me know.

## 2011-09-28 NOTE — Progress Notes (Signed)
  Subjective:    Patient ID: [name] [name], female    DOB: [birth year], 51 y.o.   MRN: [id number]  HPI   complaint low back pain Patient has had alternating right-sided and left-sided low back pain. Also has hip pain. Left buttock pain as well. Has been evaluated by orthopedics. MRI showed mild facet arthropathy some SI joint arthropathy but no significant disc problems. Sacroiliac injection done by Orthopedics  but x-ray was not utilized No physical therapy. Neck pain is not a big issue at this point. Has had cervical fusion in the past Dr. Jordan Likes. C4-5 ACDF  Right shoulder pain comes and goes sometimes underneath the arm sometimes on top of the shoulder No left-sided shoulder pain. There is left-sided elbow pain which has been occurring over the last 6 months. Some relief with a tennis elbow strap  Review of Systems     Objective:   Physical Exam  Constitutional: She appears well-developed and well-nourished.  HENT:  Head: Normocephalic and atraumatic.  Right Ear: External ear normal.  Left Ear: External ear normal.  Eyes: Conjunctivae normal and EOM are normal. Pupils are equal, round, and reactive to light.  Neck: Normal range of motion.  Musculoskeletal:       Right shoulder: Normal.       Left elbow: tenderness found.       Lumbar back: She exhibits decreased range of motion, tenderness and bony tenderness. She exhibits no deformity and no spasm.       Left lateral epicondyle tenderness Tenderness over the left PSIS  Neurological: She has normal strength. She displays no atrophy. No cranial nerve deficit or sensory deficit. Coordination and gait normal.  Reflex Scores:      Tricep reflexes are 3+ on the right side and 3+ on the left side.      Bicep reflexes are 3+ on the right side and 3+ on the left side.      Brachioradialis reflexes are 3+ on the right side and 3+ on the left side.      Patellar reflexes are 3+ on the right side and 3+ on the left side.      Achilles  reflexes are 3+ on the right side and 3+ on the left side. Psychiatric: Her affect is blunt.          Assessment & Plan:  1. Lumbar pain with left sacroiliac disorder. She also may have some element of lumbar spondylosis on the right side. Recommend sacroiliac injection followed by core stabilization program. We may be able to avoid chronic narcotic analgesics if she is able to strengthen her core muscle groups. She has a very sedentary job and this is part of the problem. 2. 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 We'll transition her from Paxil to Cymbalta to help with the neuropathic pain. 3. Left lateral epicondylitis. Continue tennis elbow brace. Diclofenac gel. Will need some physical therapy to address this as well.

## 2011-10-03 ENCOUNTER — Telehealth: Payer: Self-pay | Admitting: *Deleted

## 2011-10-03 NOTE — Telephone Encounter (Signed)
She also has to sign a contract at next visit and repeat UDS in about 2 month

## 2011-10-03 NOTE — Telephone Encounter (Signed)
Inconsistent UDS. Positive for non-prescribed Oxycodone. Negative for prescribed Hydrocodone.

## 2011-10-03 NOTE — Telephone Encounter (Signed)
Pt states that she doesn't know where the Oxycodone is coming from, she is only taking Hydrocodone.

## 2011-10-03 NOTE — Telephone Encounter (Signed)
She was a new patient. So we don't have a CSA for her.

## 2011-10-03 NOTE — Telephone Encounter (Signed)
This is a reason to d/c, per Dr. Jodean Lima guidelines, did she sign a contract, then d/c

## 2011-10-09 ENCOUNTER — Ambulatory Visit: Payer: Managed Care, Other (non HMO) | Admitting: Physical Medicine & Rehabilitation

## 2011-10-11 NOTE — Telephone Encounter (Signed)
Phar is waiting on refills not PA

## 2011-10-22 ENCOUNTER — Other Ambulatory Visit: Payer: Self-pay | Admitting: Internal Medicine

## 2011-10-22 NOTE — Telephone Encounter (Signed)
Last OV 8.23.13. Last filled 9.9.13. OK to refill?

## 2011-10-22 NOTE — Telephone Encounter (Signed)
Ok to refill x 1  

## 2011-10-22 NOTE — Telephone Encounter (Signed)
Refill done.  

## 2011-11-02 ENCOUNTER — Ambulatory Visit (INDEPENDENT_AMBULATORY_CARE_PROVIDER_SITE_OTHER): Payer: Managed Care, Other (non HMO) | Admitting: Internal Medicine

## 2011-11-02 ENCOUNTER — Encounter: Payer: Self-pay | Admitting: Internal Medicine

## 2011-11-02 VITALS — BP 130/84 | HR 68 | Temp 97.6°F | Wt 104.0 lb

## 2011-11-02 DIAGNOSIS — R5381 Other malaise: Secondary | ICD-10-CM

## 2011-11-02 DIAGNOSIS — M545 Low back pain, unspecified: Secondary | ICD-10-CM

## 2011-11-02 DIAGNOSIS — R5382 Chronic fatigue, unspecified: Secondary | ICD-10-CM

## 2011-11-02 DIAGNOSIS — R232 Flushing: Secondary | ICD-10-CM

## 2011-11-02 DIAGNOSIS — R5383 Other fatigue: Secondary | ICD-10-CM

## 2011-11-02 DIAGNOSIS — N951 Menopausal and female climacteric states: Secondary | ICD-10-CM

## 2011-11-02 DIAGNOSIS — G4700 Insomnia, unspecified: Secondary | ICD-10-CM

## 2011-11-02 HISTORY — DX: Flushing: R23.2

## 2011-11-02 LAB — CBC WITH DIFFERENTIAL/PLATELET
Basophils Absolute: 0 10*3/uL (ref 0.0–0.1)
HCT: 40.2 % (ref 36.0–46.0)
Lymphocytes Relative: 32 % (ref 12–46)
Lymphs Abs: 3.6 10*3/uL (ref 0.7–4.0)
MCV: 86.8 fL (ref 78.0–100.0)
Neutro Abs: 6.9 10*3/uL (ref 1.7–7.7)
Platelets: 265 10*3/uL (ref 150–400)
RBC: 4.63 MIL/uL (ref 3.87–5.11)
RDW: 14.7 % (ref 11.5–15.5)
WBC: 11.2 10*3/uL — ABNORMAL HIGH (ref 4.0–10.5)

## 2011-11-02 LAB — TSH: TSH: 1.931 u[IU]/mL (ref 0.350–4.500)

## 2011-11-02 LAB — T4, FREE: Free T4: 1.14 ng/dL (ref 0.80–1.80)

## 2011-11-02 MED ORDER — ZOLPIDEM TARTRATE 10 MG PO TABS: 10.0000 mg | ORAL_TABLET | Freq: Every evening | ORAL | Status: DC | PRN

## 2011-11-02 MED ORDER — AZELASTINE HCL 0.1 % NA SOLN: 2.0000 | Freq: Two times a day (BID) | NASAL | Status: DC

## 2011-11-02 NOTE — Assessment & Plan Note (Signed)
51 year old white female experiencing intermittent hot flashes. She underwent uterine ablation 2008. She is likely menopausal.  Check FSH and LH levels. I would like to avoid hormone replacement considering her history of tobacco use.  We discussed possibly using gabapentin for hot flashes.

## 2011-11-02 NOTE — Progress Notes (Signed)
Subjective:    Patient ID: [name] [name], female    DOB: [birth year], 51 y.o.   MRN: [id number]  HPI  51 year old white female with history of COPD, chronic tobacco use low back pain for followup. Patient has been working with pain management specialist. She is being weaned off Paxil and switched to Cymbalta. She complains of significant fatigue for last 6 months. She is also experiencing intermittent hot flashes. She wonders if she is going through menopause. She has history of uterine ablation 2008.  COPD - denies cough or shortness of breath  LBP - she is scheduled to have EDI.    Review of Systems Poor appetite, no change in weight  Past Medical History  Diagnosis Date  . Hyperlipidemia   . Hypertension   . Atypical chest pain   . Depression   . Tobacco abuse   . Chronic insomnia   . PVD (peripheral vascular disease)     segmental femeropoliteal disease    History   Social History  . Marital Status: Single    Spouse Name: N/A    Number of Children: N/A  . Years of Education: N/A   Occupational History  . Not on file.   Social History Main Topics  . Smoking status: Current Every Day Smoker  . Smokeless tobacco: Never Used   Comment: 1 PPD  . Alcohol Use: Not on file  . Drug Use: Not on file  . Sexually Active: Not on file   Other Topics Concern  . Not on file   Social History Narrative   Divorced with 4 children Current Smoker 1ppd No alcoholNo illicit drug use   Occupation: Tax preparation     Past Surgical History  Procedure Date  . Wrist surgery 2005    right wrist  . Cesarean section 1990  . Nasal sinus surgery 1999  . Bunionectomy 1996    bilateral  . Cervical discectomy 06/21/2006     and fusion   . Endometrial ablation June 2010  . Tubal ligation     Family History  Problem Relation Age of Onset  . Alcohol abuse Father     deceased age 98  . Emphysema Mother     deceased age 10  . ADD / ADHD Son     No Known Allergies  Current  Outpatient Prescriptions on File Prior to Visit  Medication Sig Dispense Refill  . budesonide-formoterol (SYMBICORT) 160-4.5 MCG/ACT inhaler Inhale 2 puffs into the lungs 2 (two) times daily.      . clonazePAM (KLONOPIN) 1 MG tablet Take 1 tablet (1 mg total) by mouth daily as needed.  30 tablet  3  . diclofenac (FLECTOR) 1.3 % PTCH Place 1 patch onto the skin 2 (two) times daily.  60 patch  0  . diclofenac sodium (VOLTAREN) 1 % GEL Apply 2 g topically 4 (four) times daily.  3 Tube  3  . DULoxetine (CYMBALTA) 20 MG capsule Take 1 capsule (20 mg total) by mouth daily.  30 capsule  0  . fluticasone (FLONASE) 50 MCG/ACT nasal spray 2 sprays by Nasal route daily.        Marland Kitchen HYDROcodone-acetaminophen (VICODIN) 5-500 MG per tablet TAKE 1 TABLET BY MOUTH EVERY 8 HOURS AS NEEDED FOR PAIN  60 tablet  0  . ipratropium-albuterol (DUONEB) 0.5-2.5 (3) MG/3ML SOLN Take 3 mLs by nebulization 4 (four) times daily as needed.  360 mL  11  . mometasone (NASONEX) 50 MCG/ACT nasal spray Place 2 sprays into  the nose daily.  17 g  3  . PARoxetine (PAXIL) 40 MG tablet Take 0.5 tablets (20 mg total) by mouth daily.  90 tablet  1  . DISCONTD: azelastine (ASTELIN) 137 MCG/SPRAY nasal spray 2 sprays by Nasal route 2 (two) times daily. Use in each nostril as directed       . DISCONTD: zolpidem (AMBIEN) 10 MG tablet Take 1 tablet (10 mg total) by mouth at bedtime as needed.  30 tablet  3    BP 130/84  Pulse 68  Temp 97.6 F (36.4 C) (Oral)  Wt 104 lb (47.174 kg)       Objective:   Physical Exam  Constitutional: She is oriented to person, place, and time.       Thin 51 year old white female  HENT:  Head: Normocephalic and atraumatic.  Right Ear: External ear normal.  Left Ear: External ear normal.  Mouth/Throat: Oropharynx is clear and moist.  Eyes: Conjunctivae normal and EOM are normal. Pupils are equal, round, and reactive to light.  Neck: Neck supple.  Cardiovascular: Normal rate, regular rhythm and normal  heart sounds.   Pulmonary/Chest: Effort normal and breath sounds normal. She has no wheezes.  Abdominal: Soft. Bowel sounds are normal. She exhibits no mass. There is no tenderness.  Musculoskeletal: She exhibits no tenderness.  Lymphadenopathy:    She has no cervical adenopathy.  Neurological: She is alert and oriented to person, place, and time.  Skin: Skin is warm and dry.  Psychiatric: She has a normal mood and affect. Her behavior is normal.          Assessment & Plan:

## 2011-11-02 NOTE — Assessment & Plan Note (Signed)
Patient complains of chronic fatigue for last 6 months. Check CBC, thyroid function and LFTs.

## 2011-11-02 NOTE — Assessment & Plan Note (Signed)
Refilled zolpidem.

## 2011-11-02 NOTE — Assessment & Plan Note (Signed)
Followed by pain mgt.  Pain scheduled for epidural injection.

## 2011-11-03 LAB — HEPATIC FUNCTION PANEL
Alkaline Phosphatase: 92 U/L (ref 39–117)
Bilirubin, Direct: 0.1 mg/dL (ref 0.0–0.3)
Indirect Bilirubin: 0.2 mg/dL (ref 0.0–0.9)

## 2011-11-03 LAB — BASIC METABOLIC PANEL
BUN: 6 mg/dL (ref 6–23)
Calcium: 9.6 mg/dL (ref 8.4–10.5)
Glucose, Bld: 92 mg/dL (ref 70–99)
Potassium: 4.4 mEq/L (ref 3.5–5.3)
Sodium: 137 mEq/L (ref 135–145)

## 2011-11-03 LAB — LUTEINIZING HORMONE: LH: 63.2 m[IU]/mL

## 2011-11-13 ENCOUNTER — Other Ambulatory Visit: Payer: Self-pay | Admitting: Internal Medicine

## 2011-11-26 ENCOUNTER — Other Ambulatory Visit: Payer: Self-pay | Admitting: Physical Medicine & Rehabilitation

## 2011-11-26 ENCOUNTER — Other Ambulatory Visit: Payer: Self-pay | Admitting: Internal Medicine

## 2011-11-30 ENCOUNTER — Telehealth: Payer: Self-pay

## 2011-11-30 MED ORDER — HYDROCODONE-ACETAMINOPHEN 5-500 MG PO TABS: 1.0000 | ORAL_TABLET | Freq: Three times a day (TID) | ORAL | Status: DC | PRN

## 2011-11-30 NOTE — Telephone Encounter (Signed)
Rx request for hydrocodone-acetaminophen 5-500.  Rx last filled 10/22/11.  Pt last seen 11/02/11.  Pls advise.

## 2011-11-30 NOTE — Telephone Encounter (Signed)
Ok to refill x 1.  Is pt following up with pain mgt?

## 2011-11-30 NOTE — Telephone Encounter (Signed)
Pt states she has not been able to go yet due to not having a ride.  Pt states she is trying to get in to pain management within the next month.  Pt aware rx sent to pharmacy.

## 2012-01-07 ENCOUNTER — Ambulatory Visit: Payer: Managed Care, Other (non HMO) | Admitting: Physical Medicine & Rehabilitation

## 2012-01-10 ENCOUNTER — Encounter: Payer: Self-pay | Admitting: Physical Medicine & Rehabilitation

## 2012-01-10 ENCOUNTER — Encounter: Payer: Managed Care, Other (non HMO) | Attending: Physical Medicine & Rehabilitation

## 2012-01-10 ENCOUNTER — Ambulatory Visit (HOSPITAL_BASED_OUTPATIENT_CLINIC_OR_DEPARTMENT_OTHER): Payer: Managed Care, Other (non HMO) | Admitting: Physical Medicine & Rehabilitation

## 2012-01-10 VITALS — BP 159/86 | HR 103 | Resp 14 | Ht 59.0 in | Wt 102.8 lb

## 2012-01-10 DIAGNOSIS — M533 Sacrococcygeal disorders, not elsewhere classified: Secondary | ICD-10-CM | POA: Insufficient documentation

## 2012-01-10 DIAGNOSIS — M7710 Lateral epicondylitis, unspecified elbow: Secondary | ICD-10-CM | POA: Insufficient documentation

## 2012-01-10 DIAGNOSIS — M961 Postlaminectomy syndrome, not elsewhere classified: Secondary | ICD-10-CM | POA: Insufficient documentation

## 2012-01-10 DIAGNOSIS — M545 Low back pain, unspecified: Secondary | ICD-10-CM | POA: Insufficient documentation

## 2012-01-10 MED ORDER — TRAMADOL HCL 50 MG PO TABS: 50.0000 mg | ORAL_TABLET | Freq: Three times a day (TID) | ORAL | Status: DC | PRN

## 2012-01-10 NOTE — Patient Instructions (Signed)
Sacroiliac Joint Dysfunction The sacroiliac joint connects the lower part of the spine (the sacrum) with the bones of the pelvis. CAUSES  Sometimes, there is no obvious reason for sacroiliac joint dysfunction. Other times, it may occur   During pregnancy.  After injury, such as:  Car accidents.  Sport-related injuries.  Work-related injuries.  Due to one leg being shorter than the other.  Due to other conditions that affect the joints, such as:  Rheumatoid arthritis.  Gout.  Psoriasis.  Joint infection (septic arthritis). SYMPTOMS  Symptoms may include:  Pain in the:  Lower back.  Buttocks.  Groin.  Thighs and legs.  Difficult sitting, standing, walking, lying, bending or lifting. DIAGNOSIS  A number of tests may be used to help diagnose the cause of sacroiliac joint dysfunction, including:  Imaging tests to look for other causes of pain, including:  MRI.  CT scan.  Bone scan.  Diagnostic injection: During a special x-ray (called fluoroscopy), a needle is put into the sacroiliac joint. A numbing medicine is injected into the joint. If the pain is improved or stopped, the diagnosis of sacroiliac joint dysfunction is more likely. TREATMENT  There are a number of types of treatment used for sacroiliac joint dysfunction, including:  Only take over-the-counter or prescription medicines for pain, discomfort, or fever as directed by your caregiver.  Medications to relax muscles.  Rest. Decreasing activity can help cut down on painful muscle spasms and allow the back to heal.  Application of heat or ice to the lower back may improve muscle spasms and soothe pain.  Brace. A special back brace, called a sacroiliac belt, can help support the joint while your back is healing.  Physical therapy can help teach comfortable positions and exercises to strengthen muscles that support the sacroiliac joint.  Cortisone injections. Injections of steroid medicine into the  joint can help decrease swelling and improve pain.  Hyaluronic acid injections. This chemical improves lubrication within the sacroiliac joint, thereby decreasing pain.  Radiofrequency ablation. A special needle is placed into the joint, where it burns away nerves that are carrying pain messages from the joint.  Surgery. Because pain occurs during movement of the joint, screws and plates may be installed in order to limit or prevent joint motion. HOME CARE INSTRUCTIONS   Take all medications exactly as directed.  Follow instructions regarding both rest and physical activity, to avoid worsening the pain.  Do physical therapy exercises exactly as prescribed. SEEK IMMEDIATE MEDICAL CARE IF:  You experience increasingly severe pain.  You develop new symptoms, such as numbness or tingling in your legs or feet.  You lose bladder or bowel control. Document Released: 03/16/2008 Document Revised: 03/12/2011 Document Reviewed: 03/16/2008 [employer] Patient Information 2013 [employer].  

## 2012-01-10 NOTE — Progress Notes (Signed)
Subjective:    Patient ID: [name] [name], female    DOB: 13-Oct-1960, 52 y.o.   MRN: [id number]  HPI Complaint left sided low back pain  Patient has had alternating right-sided and left-sided low back pain. Also has hip pain. Left buttock pain as well. Has been evaluated by orthopedics. MRI showed mild facet arthropathy some SI joint arthropathy but no significant disc problems.  Sacroiliac injection done by Orthopedics but x-ray was not utilized  No physical therapy.  Neck pain is not a big issue at this point. Has had cervical fusion in the past Dr. Jordan Likes. C4-5 ACDF  Right shoulder pain comes and goes sometimes underneath the arm sometimes on top of the shoulder  No left-sided shoulder pain. ROS Weight loss Medium appetite   Pain ivnentory Average Pain 8 Pain Right Now 8 My pain is sharp, stabbing and aching  In the last 24 hours, has pain interfered with the following? General activity 7 Relation with others 9 Enjoyment of life 9 What TIME of day is your pain at its worst? evening Sleep (in general) Fair  Pain is worse with: sitting Pain improves with: medication Relief from Meds: 6  Mobility do you drive?  yes Do you have any goals in this area?  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  no  Neuro/Psych spasms depression  Prior Studies Any changes since last visit?  no  Physicians involved in your care Any changes since last visit?  no   Family History  Problem Relation Age of Onset  . Alcohol abuse Father     deceased age 34  . Emphysema Mother     deceased age 59  . ADD / ADHD Son    History   Social History  . Marital Status: Single    Spouse Name: N/A    Number of Children: N/A  . Years of Education: N/A   Social History Main Topics  . Smoking status: Current Every Day Smoker -- 0.5 packs/day  . Smokeless tobacco: Never Used  . Alcohol Use: None  . Drug Use: None  . Sexually  Active: None   Other Topics Concern  . None   Social History Narrative   Divorced with 4 children Current Smoker 1ppd No alcoholNo illicit drug use   Occupation: Tax preparation    Past Surgical History  Procedure Date  . Wrist surgery 2005    right wrist  . Cesarean section 1990  . Nasal sinus surgery 1999  . Bunionectomy 1996    bilateral  . Cervical discectomy 06/21/2006     and fusion   . Endometrial ablation June 2010  . Tubal ligation    Past Medical History  Diagnosis Date  . Hyperlipidemia   . Hypertension   . Atypical chest pain   . Depression   . Tobacco abuse   . Chronic insomnia   . PVD (peripheral vascular disease)     segmental femeropoliteal disease   BP 159/86  Pulse 103  Resp 14  Ht 4\' 11"  (1.499 m)  Wt 102 lb 12.8 oz (46.63 kg)  BMI 20.76 kg/m2  SpO2 99%    Review of Systems  Musculoskeletal: Positive for back pain.  Neurological:       Spasms   Psychiatric/Behavioral: Positive for dysphoric mood.  All other systems reviewed and are negative.       Objective:   Physical Exam  Head: Normocephalic and atraumatic.  Right Ear: External ear normal.  Left Ear: External ear normal.  Eyes: Conjunctivae normal and EOM are normal. Pupils are equal, round, and reactive to light.  Neck: Normal range of motion.  Musculoskeletal:  Right shoulder: Normal.  Left elbow: tenderness found.  Lumbar back: She exhibits decreased range of motion, tenderness and bony tenderness. She exhibits no deformity and no spasm.   Tenderness over the left PSIS  Neurological: She has normal strength. She displays no atrophy. No cranial nerve deficit or sensory deficit. Coordination and gait normal.  Reflex Scores:  Tricep reflexes are 3+ on the right side and 3+ on the left side.  Bicep reflexes are 3+ on the right side and 3+ on the left side.  Brachioradialis reflexes are 3+ on the right side and 3+ on the left side.  Patellar reflexes are 3+ on the right side  and 3+ on the left side.  Achilles reflexes are 3+ on the right side and 3+ on the left side. Psychiatric: Her affect is blunt.        Assessment & Plan:  1. Lumbar pain with left sacroiliac disorder. She also may have some element of lumbar spondylosis on the right side. Recommend sacroiliac injection followed by core stabilization program. We may be able to avoid chronic narcotic analgesics if she is able to strengthen her core muscle groups. She has a very sedentary job and this is part of the problem.  2. 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 Switched  from Paxil to Cymbalta to help with the neuropathic pain.  3. Left lateral epicondylitis.resolved   Urine drug screen demonstrated oxycodone but no evidence of hydrocodone. Patient was prescribed hydrocodone by primary care and denies a prescription and denies taking oxycodone. Her urine drug screen did show Klonopin But no evidence of Ambien Will trial on tramadol, nonnarcotic treatment through this office based on the above.

## 2012-01-31 ENCOUNTER — Ambulatory Visit: Payer: Managed Care, Other (non HMO) | Admitting: Physical Medicine & Rehabilitation

## 2012-02-27 ENCOUNTER — Other Ambulatory Visit: Payer: Self-pay | Admitting: Physical Medicine & Rehabilitation

## 2012-02-27 ENCOUNTER — Other Ambulatory Visit: Payer: Self-pay | Admitting: Internal Medicine

## 2012-02-27 ENCOUNTER — Other Ambulatory Visit: Payer: Self-pay | Admitting: Family

## 2012-03-03 MED ORDER — HYDROCODONE-ACETAMINOPHEN 5-325 MG PO TABS: 1.0000 | ORAL_TABLET | Freq: Three times a day (TID) | ORAL | Status: DC | PRN

## 2012-03-03 NOTE — Telephone Encounter (Signed)
Pharm calling to follow up on request refill for pt of HYDROcodone-acetaminophen (VICODIN) 5-500 MG per tablet. RX also needs to be changed to the new  acetaminophen 5-325mg .

## 2012-04-30 ENCOUNTER — Other Ambulatory Visit: Payer: Self-pay | Admitting: Internal Medicine

## 2012-06-02 ENCOUNTER — Telehealth: Payer: Self-pay

## 2012-06-02 MED ORDER — DULOXETINE HCL 20 MG PO CPEP: ORAL_CAPSULE | ORAL | Status: DC

## 2012-06-02 NOTE — Telephone Encounter (Signed)
[facility] called requesting cymbalta refill.  Cymbalta refilled and noted that patient needs an appointment for further refills.

## 2012-06-30 ENCOUNTER — Other Ambulatory Visit: Payer: Self-pay | Admitting: Internal Medicine

## 2012-07-01 ENCOUNTER — Other Ambulatory Visit: Payer: Self-pay

## 2012-07-02 ENCOUNTER — Other Ambulatory Visit: Payer: Self-pay | Admitting: *Deleted

## 2012-07-07 ENCOUNTER — Other Ambulatory Visit: Payer: Self-pay

## 2012-07-08 ENCOUNTER — Other Ambulatory Visit: Payer: Self-pay | Admitting: *Deleted

## 2012-07-08 MED ORDER — CLONAZEPAM 1 MG PO TABS: ORAL_TABLET | ORAL | Status: DC

## 2012-07-09 ENCOUNTER — Other Ambulatory Visit: Payer: Self-pay | Admitting: *Deleted

## 2012-07-09 MED ORDER — DULOXETINE HCL 20 MG PO CPEP: ORAL_CAPSULE | ORAL | Status: DC

## 2012-07-16 ENCOUNTER — Telehealth: Payer: Self-pay | Admitting: Internal Medicine

## 2012-07-16 NOTE — Telephone Encounter (Signed)
[name]/patient.  PCPYoo, Doe-Hyun [name] [name]) (Adults only). Called because she requested refills be sent to [facility] at [address] on 06/29/12 for four medications but only 3 have been refilled; Ambien is still unfilled.  Out of Ambien since 07/01/12.  Has been using it for years.  Last office visit 11/02/11.  Informed  Dr [name] is out of the office; another MD will evaluate the refill request.  Please call back at [phone] to advise when Rx is ordered.

## 2012-07-18 MED ORDER — ZOLPIDEM TARTRATE 10 MG PO TABS: ORAL_TABLET | ORAL | Status: DC

## 2012-07-18 NOTE — Telephone Encounter (Signed)
rx called in

## 2012-07-18 NOTE — Telephone Encounter (Signed)
Ok to refill meds x 3

## 2012-08-21 ENCOUNTER — Other Ambulatory Visit: Payer: Self-pay | Admitting: Internal Medicine

## 2012-09-20 ENCOUNTER — Other Ambulatory Visit: Payer: Self-pay | Admitting: Internal Medicine

## 2012-10-09 ENCOUNTER — Ambulatory Visit (INDEPENDENT_AMBULATORY_CARE_PROVIDER_SITE_OTHER): Payer: Managed Care, Other (non HMO) | Admitting: Internal Medicine

## 2012-10-09 ENCOUNTER — Encounter: Payer: Self-pay | Admitting: Internal Medicine

## 2012-10-09 VITALS — BP 176/94 | HR 98 | Temp 98.1°F | Ht 59.0 in | Wt 101.0 lb

## 2012-10-09 DIAGNOSIS — M545 Low back pain, unspecified: Secondary | ICD-10-CM

## 2012-10-09 DIAGNOSIS — F17200 Nicotine dependence, unspecified, uncomplicated: Secondary | ICD-10-CM

## 2012-10-09 DIAGNOSIS — E785 Hyperlipidemia, unspecified: Secondary | ICD-10-CM

## 2012-10-09 DIAGNOSIS — I10 Essential (primary) hypertension: Secondary | ICD-10-CM

## 2012-10-09 MED ORDER — AMLODIPINE BESYLATE 5 MG PO TABS: 5.0000 mg | ORAL_TABLET | Freq: Every day | ORAL | Status: DC

## 2012-10-09 MED ORDER — CLONAZEPAM 1 MG PO TABS: 1.0000 mg | ORAL_TABLET | Freq: Every day | ORAL | Status: DC | PRN

## 2012-10-09 MED ORDER — LOSARTAN POTASSIUM 100 MG PO TABS: 100.0000 mg | ORAL_TABLET | Freq: Every day | ORAL | Status: DC

## 2012-10-09 MED ORDER — GABAPENTIN 100 MG PO CAPS: ORAL_CAPSULE | ORAL | Status: DC

## 2012-10-09 MED ORDER — DULOXETINE HCL 60 MG PO CPEP: ORAL_CAPSULE | ORAL | Status: DC

## 2012-10-09 MED ORDER — ZOLPIDEM TARTRATE 10 MG PO TABS: ORAL_TABLET | ORAL | Status: DC

## 2012-10-09 MED ORDER — BUDESONIDE-FORMOTEROL FUMARATE 160-4.5 MCG/ACT IN AERO: 2.0000 | INHALATION_SPRAY | Freq: Two times a day (BID) | RESPIRATORY_TRACT | Status: DC

## 2012-10-09 MED ORDER — PRAVASTATIN SODIUM 40 MG PO TABS: 40.0000 mg | ORAL_TABLET | Freq: Every day | ORAL | Status: DC

## 2012-10-09 MED ORDER — HYDROCODONE-ACETAMINOPHEN 5-325 MG PO TABS: 1.0000 | ORAL_TABLET | Freq: Three times a day (TID) | ORAL | Status: DC | PRN

## 2012-10-09 MED ORDER — IPRATROPIUM-ALBUTEROL 0.5-2.5 (3) MG/3ML IN SOLN: 3.0000 mL | Freq: Four times a day (QID) | RESPIRATORY_TRACT | Status: DC | PRN

## 2012-10-09 NOTE — Assessment & Plan Note (Signed)
I strongly encouraged smoking cessation. Patient not ready to quit at this time. She may consider use of Chantix in the future.  She declines influenza vaccine and Pneumovax.

## 2012-10-09 NOTE — Progress Notes (Signed)
Subjective:    Patient ID: [name] [name], female    DOB: Aug 19, 1960, 52 y.o.   MRN: [id number]  HPI  51 year old white female with history of tobacco use, hypertension, hyperlipidemia and chronic back pain for followup. Since previous visit patient ran out of her blood pressure medications. She has had intermittent headaches with high blood pressure readings.  She saw pain management specialist but she was not happy with treatments offered.  She still has chronic low back pain.  Patient smoking still 1 pack per day.  She is not ready to quit.  She has chronic insomnia.  She takes Ambien 10 mg once daily but still has intermittent refractory insomnia.  Review of Systems Negative for chest pain, negative for chronic cough.      Past Medical History  Diagnosis Date  . Hyperlipidemia   . Hypertension   . Atypical chest pain   . Depression   . Tobacco abuse   . Chronic insomnia   . PVD (peripheral vascular disease)     segmental femeropoliteal disease    History   Social History  . Marital Status: Single    Spouse Name: N/A    Number of Children: N/A  . Years of Education: N/A   Occupational History  . Not on file.   Social History Main Topics  . Smoking status: Current Every Day Smoker -- 0.50 packs/day  . Smokeless tobacco: Never Used  . Alcohol Use: Not on file  . Drug Use: Not on file  . Sexual Activity: Not on file   Other Topics Concern  . Not on file   Social History Narrative   Divorced with 4 children    Current Smoker 1ppd    No alcohol   No illicit drug use      Occupation: Tax preparation        Past Surgical History  Procedure Laterality Date  . Wrist surgery  2005    right wrist  . Cesarean section  1990  . Nasal sinus surgery  1999  . Bunionectomy  1996    bilateral  . Cervical discectomy  06/21/2006     and fusion   . Endometrial ablation  June 2010  . Tubal ligation      Family History  Problem Relation Age of Onset  . Alcohol  abuse Father     deceased age 88  . Emphysema Mother     deceased age 60  . ADD / ADHD Son     No Known Allergies  Current Outpatient Prescriptions on File Prior to Visit  Medication Sig Dispense Refill  . azelastine (ASTELIN) 137 MCG/SPRAY nasal spray Place 2 sprays into the nose 2 (two) times daily. Use in each nostril as directed  30 mL  5  . diclofenac (FLECTOR) 1.3 % PTCH Place 1 patch onto the skin 2 (two) times daily.  60 patch  0  . fluticasone (FLONASE) 50 MCG/ACT nasal spray 2 sprays by Nasal route daily.        . mometasone (NASONEX) 50 MCG/ACT nasal spray Place 2 sprays into the nose daily.  17 g  3   No current facility-administered medications on file prior to visit.    BP 176/94  Pulse 98  Temp(Src) 98.1 F (36.7 C) (Oral)  Ht 4\' 11"  (1.499 m)  Wt 101 lb (45.813 kg)  BMI 20.39 kg/m2    Objective:   Physical Exam  Constitutional: She is oriented to person, place, and time. She appears  well-developed and well-nourished. No distress.  HENT:  Head: Normocephalic and atraumatic.  Right Ear: External ear normal.  Left Ear: External ear normal.  Neck: Neck supple.  No carotid bruit  Cardiovascular: Normal rate, regular rhythm and normal heart sounds.   No murmur heard. Pulmonary/Chest: Effort normal and breath sounds normal. She has no wheezes.  Prolonged expiration  Musculoskeletal: She exhibits no edema.  Neurological: She is alert and oriented to person, place, and time. No cranial nerve deficit.  Skin: Skin is warm and dry.  Psychiatric: She has a normal mood and affect. Her behavior is normal.          Assessment & Plan:

## 2012-10-09 NOTE — Patient Instructions (Signed)
Please complete the following lab tests before your next follow up appointment: BMET, CBCD - 401.9 FLP, LFTs, TSH - 272.4 

## 2012-10-09 NOTE — Assessment & Plan Note (Signed)
Patient has chronic low back pain. Patient unhappy with her previous pain management specialist. She is not interested in multiple cortisone injections. Refer to Dr. Vear Clock for evaluation.  Vicodin refilled x 1.  She has been using OTC NSAIDs.  We discussed the risk of NSAID-induced gastritis and renal insufficiency with chronic NSAID use.

## 2012-10-09 NOTE — Assessment & Plan Note (Signed)
Patient has high-risk for cardiovascular event. Patient encouraged to take statin medication regularly. Arrange followup labs.

## 2012-10-09 NOTE — Assessment & Plan Note (Signed)
Restart losartan 100 mg and amlodipine 5 mg once daily.  Arrange follow up labs.

## 2012-11-03 ENCOUNTER — Telehealth: Payer: Self-pay | Admitting: Internal Medicine

## 2012-11-03 NOTE — Telephone Encounter (Signed)
Pt need new rx hydrocodone °

## 2012-11-04 NOTE — Telephone Encounter (Signed)
Ok to refill x 2  

## 2012-11-04 NOTE — Telephone Encounter (Signed)
Rx last filled 10/09/12 #60.  Pt last seen 10/09/2012. Pls advise.

## 2012-11-05 MED ORDER — HYDROCODONE-ACETAMINOPHEN 5-325 MG PO TABS: 1.0000 | ORAL_TABLET | Freq: Three times a day (TID) | ORAL | Status: DC | PRN

## 2012-11-05 NOTE — Telephone Encounter (Signed)
Pt aware rx ready for pick up. 

## 2012-11-05 NOTE — Telephone Encounter (Signed)
Rx printed

## 2012-11-13 ENCOUNTER — Encounter: Payer: Self-pay | Admitting: Internal Medicine

## 2012-11-14 ENCOUNTER — Other Ambulatory Visit (INDEPENDENT_AMBULATORY_CARE_PROVIDER_SITE_OTHER): Payer: Managed Care, Other (non HMO)

## 2012-11-14 DIAGNOSIS — E785 Hyperlipidemia, unspecified: Secondary | ICD-10-CM

## 2012-11-14 DIAGNOSIS — I10 Essential (primary) hypertension: Secondary | ICD-10-CM

## 2012-11-14 LAB — CBC WITH DIFFERENTIAL/PLATELET
Basophils Absolute: 0 10*3/uL (ref 0.0–0.1)
Eosinophils Absolute: 0.2 10*3/uL (ref 0.0–0.7)
Hemoglobin: 13.4 g/dL (ref 12.0–15.0)
Lymphocytes Relative: 22.6 % (ref 12.0–46.0)
MCHC: 33.9 g/dL (ref 30.0–36.0)
MCV: 89.2 fl (ref 78.0–100.0)
Monocytes Absolute: 0.7 10*3/uL (ref 0.1–1.0)
Neutrophils Relative %: 70.3 % (ref 43.0–77.0)
Platelets: 270 10*3/uL (ref 150.0–400.0)
RDW: 13.2 % (ref 11.5–14.6)

## 2012-11-14 LAB — TSH: TSH: 1.97 u[IU]/mL (ref 0.35–5.50)

## 2012-11-14 LAB — BASIC METABOLIC PANEL
Chloride: 99 mEq/L (ref 96–112)
GFR: 97.38 mL/min (ref >60.00)
Potassium: 3.2 mEq/L — ABNORMAL LOW (ref 3.5–5.1)
Sodium: 134 mEq/L — ABNORMAL LOW (ref 135–145)

## 2012-11-14 LAB — HEPATIC FUNCTION PANEL
AST: 15 U/L (ref 0–37)
Bilirubin, Direct: 0.1 mg/dL (ref 0.0–0.3)

## 2012-11-14 LAB — LIPID PANEL
Cholesterol: 101 mg/dL (ref 0–200)
HDL: 32.2 mg/dL — ABNORMAL LOW (ref >39.00)
LDL Cholesterol: 53 mg/dL (ref 0–99)
Triglycerides: 81 mg/dL (ref 0.0–149.0)
VLDL: 16.2 mg/dL (ref 0.0–40.0)

## 2012-11-19 ENCOUNTER — Ambulatory Visit (INDEPENDENT_AMBULATORY_CARE_PROVIDER_SITE_OTHER): Payer: Managed Care, Other (non HMO) | Admitting: Internal Medicine

## 2012-11-19 ENCOUNTER — Encounter: Payer: Self-pay | Admitting: Internal Medicine

## 2012-11-19 VITALS — BP 108/70 | HR 96 | Temp 97.5°F | Ht 59.0 in | Wt 101.5 lb

## 2012-11-19 DIAGNOSIS — M545 Low back pain, unspecified: Secondary | ICD-10-CM

## 2012-11-19 DIAGNOSIS — I10 Essential (primary) hypertension: Secondary | ICD-10-CM

## 2012-11-19 DIAGNOSIS — E876 Hypokalemia: Secondary | ICD-10-CM

## 2012-11-19 MED ORDER — POTASSIUM CHLORIDE ER 10 MEQ PO TBCR: 10.0000 meq | EXTENDED_RELEASE_TABLET | Freq: Every day | ORAL | Status: DC

## 2012-11-19 NOTE — Assessment & Plan Note (Signed)
Stable.  Good response to losartan and amlodipine.  Continue same dose. BP: 108/70 mmHg

## 2012-11-19 NOTE — Assessment & Plan Note (Signed)
Patient taking hydrocodone.  She reports not taking ambien, clonazepam and gabapentin before her recent urine drug screen.

## 2012-11-19 NOTE — Assessment & Plan Note (Signed)
Replace with oral potassium supplement.  Repeat BMET in 2 weeks.

## 2012-11-19 NOTE — Progress Notes (Signed)
Pre visit review using our clinic review tool, if applicable. No additional management support is needed unless otherwise documented below in the visit note. 

## 2012-11-19 NOTE — Patient Instructions (Addendum)
Please complete the following lab tests within 2 weeks: BMET - 276.8

## 2012-11-19 NOTE — Progress Notes (Signed)
Subjective:    Patient ID: [name] [name], female    DOB: March 19, 1960, 52 y.o.   MRN: [id number]  HPI 52 year old white female with chronic tobacco use, hypertension and chronic low back pain for follow up.  Her recent blood work showed hypokalemia.  She is not on diuretic.  Chronic low back pain - urine drug toxicology test reviewed.  Patient reports she ran out of ambien, clonazepam, and gabapentin before her urine test.  He has been taking hydrocodone as directed.  Hyperlipidemia - she is tolerating pravastatin   Review of Systems Negative for chest pain or shortness of breath  Past Medical History  Diagnosis Date  . Hyperlipidemia   . Hypertension   . Atypical chest pain   . Depression   . Tobacco abuse   . Chronic insomnia   . PVD (peripheral vascular disease)     segmental femeropoliteal disease    History   Social History  . Marital Status: Single    Spouse Name: N/A    Number of Children: N/A  . Years of Education: N/A   Occupational History  . Not on file.   Social History Main Topics  . Smoking status: Current Every Day Smoker -- 0.50 packs/day  . Smokeless tobacco: Never Used  . Alcohol Use: Not on file  . Drug Use: Not on file  . Sexual Activity: Not on file   Other Topics Concern  . Not on file   Social History Narrative   Divorced with 4 children    Current Smoker 1ppd    No alcohol   No illicit drug use      Occupation: Tax preparation        Past Surgical History  Procedure Laterality Date  . Wrist surgery  2005    right wrist  . Cesarean section  1990  . Nasal sinus surgery  1999  . Bunionectomy  1996    bilateral  . Cervical discectomy  06/21/2006     and fusion   . Endometrial ablation  June 2010  . Tubal ligation      Family History  Problem Relation Age of Onset  . Alcohol abuse Father     deceased age 61  . Emphysema Mother     deceased age 71  . ADD / ADHD Son     No Known Allergies  Current Outpatient Prescriptions  on File Prior to Visit  Medication Sig Dispense Refill  . amLODipine (NORVASC) 5 MG tablet Take 1 tablet (5 mg total) by mouth daily.  90 tablet  0  . azelastine (ASTELIN) 137 MCG/SPRAY nasal spray Place 2 sprays into the nose 2 (two) times daily. Use in each nostril as directed  30 mL  5  . budesonide-formoterol (SYMBICORT) 160-4.5 MCG/ACT inhaler Inhale 2 puffs into the lungs 2 (two) times daily.  1 Inhaler  5  . clonazePAM (KLONOPIN) 1 MG tablet Take 1 tablet (1 mg total) by mouth daily as needed for anxiety.  30 tablet  3  . diclofenac (FLECTOR) 1.3 % PTCH Place 1 patch onto the skin 2 (two) times daily.  60 patch  0  . DULoxetine (CYMBALTA) 60 MG capsule TAKE ONE CAPSULE BY MOUTH EVERY DAY  30 capsule  3  . fluticasone (FLONASE) 50 MCG/ACT nasal spray 2 sprays by Nasal route daily.        Marland Kitchen gabapentin (NEURONTIN) 100 MG capsule Take 2 capsules at bedtime  180 capsule  1  . HYDROcodone-acetaminophen (  NORCO/VICODIN) 5-325 MG per tablet Take 1 tablet by mouth every 8 (eight) hours as needed.  60 tablet  0  . ipratropium-albuterol (DUONEB) 0.5-2.5 (3) MG/3ML SOLN Take 3 mLs by nebulization 4 (four) times daily as needed.  360 mL  5  . losartan (COZAAR) 100 MG tablet Take 1 tablet (100 mg total) by mouth daily.  90 tablet  0  . mometasone (NASONEX) 50 MCG/ACT nasal spray Place 2 sprays into the nose daily.  17 g  3  . pravastatin (PRAVACHOL) 40 MG tablet Take 1 tablet (40 mg total) by mouth daily.  90 tablet  1  . zolpidem (AMBIEN) 10 MG tablet TAKE 1 TABLET BY MOUTH AT BEDTIME AS NEEDED  30 tablet  3   No current facility-administered medications on file prior to visit.    BP 108/70  Pulse 96  Temp(Src) 97.5 F (36.4 C) (Oral)  Ht 4\' 11"  (1.499 m)  Wt 101 lb 8 oz (46.04 kg)  BMI 20.49 kg/m2       Objective:   Physical Exam  Constitutional: She is oriented to person, place, and time. She appears well-developed and well-nourished. No distress.  Cardiovascular: Normal rate, regular  rhythm and normal heart sounds.   No murmur heard. Pulmonary/Chest: Effort normal and breath sounds normal. She has no wheezes.  Neurological: She is alert and oriented to person, place, and time. No cranial nerve deficit.  Psychiatric: She has a normal mood and affect. Her behavior is normal.          Assessment & Plan:

## 2012-11-21 ENCOUNTER — Ambulatory Visit: Payer: Managed Care, Other (non HMO) | Admitting: Internal Medicine

## 2012-12-05 ENCOUNTER — Other Ambulatory Visit: Payer: Managed Care, Other (non HMO)

## 2012-12-10 ENCOUNTER — Other Ambulatory Visit: Payer: Managed Care, Other (non HMO)

## 2012-12-23 ENCOUNTER — Telehealth: Payer: Self-pay | Admitting: Internal Medicine

## 2012-12-23 NOTE — Telephone Encounter (Signed)
Patient Information:  Caller Name: Bryar  Phone: [phone]  Patient: [name] [name]  Gender: Female  DOB: [birth year]  Age: 52 Years  PCP: [name] Doe-Hyun Molly Maduro) (Adults only)  Pregnant: No  Office Follow Up:  Does the office need to follow up with this patient?: No  Instructions For The Office: N/A   Symptoms  Reason For Call & Symptoms: Lakeshia states she has had back pain x 2 years. Is waiting for referral to a pain management clinic. Was denied by [facility] due to insurance. States pain has been severe onset 12/09/12. Rates pain 10  on 1/10 pt scale. Worse when weather is cold. Increased weakness in right foot onset 12/09/12. Per back pain protocol has ED disposition due to weakness of leg. No appointments available in EPIC. Advised UC /ED.  Reviewed Health History In EMR: Yes  Reviewed Medications In EMR: Yes  Reviewed Allergies In EMR: Yes  Reviewed Surgeries / Procedures: Yes  Date of Onset of Symptoms: 12/09/2012  Treatments Tried: Heating pad  Treatments Tried Worked: No OB / GYN:  LMP: Unknown  Guideline(s) Used:  Back Pain  Disposition Per Guideline:   Go to ED Now (or to Office with PCP Approval)  Reason For Disposition Reached:   Weakness of a leg or foot (e.g., unable to bear weight, dragging foot)  Advice Given:  N/A  Patient Will Follow Care Advice:  YES

## 2012-12-30 ENCOUNTER — Telehealth: Payer: Self-pay | Admitting: Internal Medicine

## 2012-12-30 NOTE — Telephone Encounter (Signed)
Pt is requesting a refill of HYDROcodone-acetaminophen (NORCO/VICODIN) 5-325 MG per tablet. Please advise.

## 2012-12-31 MED ORDER — HYDROCODONE-ACETAMINOPHEN 5-325 MG PO TABS: 1.0000 | ORAL_TABLET | Freq: Three times a day (TID) | ORAL | Status: DC | PRN

## 2012-12-31 NOTE — Telephone Encounter (Signed)
Ok to rf x2

## 2012-12-31 NOTE — Telephone Encounter (Signed)
rx up front ready for p/u, pt aware 

## 2013-01-05 ENCOUNTER — Encounter: Payer: Self-pay | Admitting: Internal Medicine

## 2013-01-25 ENCOUNTER — Telehealth: Payer: Self-pay | Admitting: Internal Medicine

## 2013-01-25 NOTE — Telephone Encounter (Signed)
[facility] requesting refills of the following:  clonazePAM (KLONOPIN) 1 MG tablet Hyzaar

## 2013-01-26 NOTE — Telephone Encounter (Signed)
Ok to refill x 3 

## 2013-01-27 MED ORDER — CLONAZEPAM 1 MG PO TABS: 1.0000 mg | ORAL_TABLET | Freq: Every day | ORAL | Status: DC | PRN

## 2013-01-27 MED ORDER — LOSARTAN POTASSIUM 100 MG PO TABS: 100.0000 mg | ORAL_TABLET | Freq: Every day | ORAL | Status: DC

## 2013-01-27 NOTE — Telephone Encounter (Signed)
rx called in

## 2013-02-10 ENCOUNTER — Other Ambulatory Visit: Payer: Self-pay | Admitting: Pain Medicine

## 2013-02-10 DIAGNOSIS — M545 Low back pain, unspecified: Secondary | ICD-10-CM

## 2013-02-13 ENCOUNTER — Telehealth: Payer: Self-pay | Admitting: Internal Medicine

## 2013-02-13 NOTE — Telephone Encounter (Signed)
Ok for refill? 

## 2013-02-13 NOTE — Telephone Encounter (Signed)
Pt is requesting new rx losartan (COZAAR) 100 MG tablet, 90 day supply, and amlodipine (norvasc) 5 mg 90 day supply, donazepam (klonopin) 1 mg, duloxetine (cymbalta) 60 mg 90 day supply, and zolpidem (ambien) 10 mg 90 day supply, pt states she had to switch pharmacy, send to [email or web address]-[phone]

## 2013-02-14 ENCOUNTER — Other Ambulatory Visit: Payer: Managed Care, Other (non HMO)

## 2013-02-17 MED ORDER — LOSARTAN POTASSIUM 100 MG PO TABS: 100.0000 mg | ORAL_TABLET | Freq: Every day | ORAL | Status: DC

## 2013-02-17 MED ORDER — CLONAZEPAM 1 MG PO TABS: 1.0000 mg | ORAL_TABLET | Freq: Every day | ORAL | Status: DC | PRN

## 2013-02-17 MED ORDER — ZOLPIDEM TARTRATE 10 MG PO TABS: ORAL_TABLET | ORAL | Status: DC

## 2013-02-17 MED ORDER — AMLODIPINE BESYLATE 5 MG PO TABS: 5.0000 mg | ORAL_TABLET | Freq: Every day | ORAL | Status: DC

## 2013-02-17 MED ORDER — DULOXETINE HCL 60 MG PO CPEP: ORAL_CAPSULE | ORAL | Status: DC

## 2013-02-17 NOTE — Telephone Encounter (Signed)
rx sent in electronically, ambien and clonazepam called into pharmacy

## 2013-02-20 ENCOUNTER — Other Ambulatory Visit: Payer: Managed Care, Other (non HMO)

## 2013-02-20 ENCOUNTER — Ambulatory Visit: Payer: Managed Care, Other (non HMO) | Admitting: Internal Medicine

## 2013-02-27 ENCOUNTER — Ambulatory Visit
Admission: RE | Admit: 2013-02-27 | Discharge: 2013-02-27 | Disposition: A | Discharge status: Home / Self Care | Payer: BC Managed Care – PPO | Source: Ambulatory Visit | Attending: Pain Medicine | Admitting: Pain Medicine

## 2013-02-27 DIAGNOSIS — M545 Low back pain, unspecified: Secondary | ICD-10-CM

## 2013-05-18 ENCOUNTER — Other Ambulatory Visit: Payer: Self-pay | Admitting: Internal Medicine

## 2013-06-22 ENCOUNTER — Other Ambulatory Visit: Payer: Self-pay | Admitting: Internal Medicine

## 2013-07-22 ENCOUNTER — Other Ambulatory Visit (HOSPITAL_COMMUNITY): Payer: Self-pay | Admitting: Pain Medicine

## 2013-07-22 ENCOUNTER — Ambulatory Visit (HOSPITAL_COMMUNITY)
Admission: RE | Admit: 2013-07-22 | Discharge: 2013-07-22 | Disposition: A | Discharge status: Home / Self Care | Payer: BC Managed Care – PPO | Source: Ambulatory Visit | Attending: Pain Medicine | Admitting: Pain Medicine

## 2013-07-22 DIAGNOSIS — M549 Dorsalgia, unspecified: Secondary | ICD-10-CM

## 2013-07-22 DIAGNOSIS — M545 Low back pain, unspecified: Secondary | ICD-10-CM | POA: Insufficient documentation

## 2013-07-22 DIAGNOSIS — I700 Atherosclerosis of aorta: Secondary | ICD-10-CM | POA: Insufficient documentation

## 2013-07-29 ENCOUNTER — Emergency Department (HOSPITAL_COMMUNITY): Payer: BC Managed Care – PPO

## 2013-07-29 ENCOUNTER — Emergency Department (HOSPITAL_COMMUNITY)
Admission: EM | Admit: 2013-07-29 | Discharge: 2013-07-29 | Disposition: A | Discharge status: Home / Self Care | Payer: BC Managed Care – PPO | Attending: Emergency Medicine | Admitting: Emergency Medicine

## 2013-07-29 ENCOUNTER — Encounter (HOSPITAL_COMMUNITY): Payer: Self-pay | Admitting: Radiology

## 2013-07-29 DIAGNOSIS — I10 Essential (primary) hypertension: Secondary | ICD-10-CM | POA: Insufficient documentation

## 2013-07-29 DIAGNOSIS — M545 Low back pain, unspecified: Secondary | ICD-10-CM | POA: Insufficient documentation

## 2013-07-29 DIAGNOSIS — M25552 Pain in left hip: Secondary | ICD-10-CM

## 2013-07-29 DIAGNOSIS — G8929 Other chronic pain: Secondary | ICD-10-CM | POA: Insufficient documentation

## 2013-07-29 DIAGNOSIS — F17200 Nicotine dependence, unspecified, uncomplicated: Secondary | ICD-10-CM | POA: Insufficient documentation

## 2013-07-29 DIAGNOSIS — M25559 Pain in unspecified hip: Secondary | ICD-10-CM | POA: Insufficient documentation

## 2013-07-29 DIAGNOSIS — E785 Hyperlipidemia, unspecified: Secondary | ICD-10-CM | POA: Insufficient documentation

## 2013-07-29 DIAGNOSIS — F329 Major depressive disorder, single episode, unspecified: Secondary | ICD-10-CM | POA: Insufficient documentation

## 2013-07-29 DIAGNOSIS — F3289 Other specified depressive episodes: Secondary | ICD-10-CM | POA: Insufficient documentation

## 2013-07-29 LAB — URINALYSIS, ROUTINE W REFLEX MICROSCOPIC
Bilirubin Urine: NEGATIVE
Glucose, UA: NEGATIVE mg/dL
Hgb urine dipstick: NEGATIVE
Ketones, ur: NEGATIVE mg/dL
Leukocytes, UA: NEGATIVE
Nitrite: NEGATIVE
Protein, ur: NEGATIVE mg/dL
Specific Gravity, Urine: 1.004 — ABNORMAL LOW (ref 1.005–1.030)
Urobilinogen, UA: 0.2 mg/dL (ref 0.0–1.0)
pH: 7 (ref 5.0–8.0)

## 2013-07-29 MED ORDER — DIAZEPAM 2 MG PO TABS
2.0000 mg | ORAL_TABLET | Freq: Once | ORAL | Status: AC
  Administered 2013-07-29: 2 mg via ORAL
  Filled 2013-07-29: qty 1

## 2013-07-29 MED ORDER — HYDROMORPHONE HCL PF 1 MG/ML IJ SOLN
1.0000 mg | Freq: Once | INTRAMUSCULAR | Status: AC
  Administered 2013-07-29: 1 mg via INTRAMUSCULAR
  Filled 2013-07-29: qty 1

## 2013-07-29 NOTE — ED Provider Notes (Signed)
CSN: [id number]     Arrival date & time 07/29/13  1332 History   First MD Initiated Contact with Patient 07/29/13 1456     Chief Complaint  Patient presents with  . Hip Pain  . Back Pain     (Consider location/radiation/quality/duration/timing/severity/associated sxs/prior Treatment) HPI  53 year old female with lower back and left hip pain. Patient has a history of chronic back and hip pain and is followed by pain management. She reports that her pain has been steadily worse within the past 2 weeks. She denies any specific trauma but states that she sleep walks and may have potential he fallen without knowing it. First noticed worsening pain approximately 2 weeks ago when she woke up and was getting out of bed. Tolerable at rest. Significantly worse with movement and standing. She was evaluated at pain management today and referred to the emergency room. Previous diagnoses include sacroiliac arthropathy, myofascial pain syndrome and trochanteric bursitis among other things. She has no specific urinary complaints. No past history of kidney stones. She has radiation of her pain into her left thigh. No numbness or tingling or focal loss of strength. No fevers or chills. Denies any IV drug use. She has been on oral steroids previously for pain and has had previous steroid injections. No abdominal pain. She takes oxycodone and gabapentin. Has not been getting much pain relief with this.  Past Medical History  Diagnosis Date  . Hyperlipidemia   . Hypertension   . Atypical chest pain   . Depression   . Tobacco abuse   . Chronic insomnia   . PVD (peripheral vascular disease)     segmental femeropoliteal disease   Past Surgical History  Procedure Laterality Date  . Wrist surgery  2005    right wrist  . Cesarean section  1990  . Nasal sinus surgery  1999  . Bunionectomy  1996    bilateral  . Cervical discectomy  06/21/2006     and fusion   . Endometrial ablation  June 2010  . Tubal  ligation     Family History  Problem Relation Age of Onset  . Alcohol abuse Father     deceased age 108  . Emphysema Mother     deceased age 53  . ADD / ADHD Son    History  Substance Use Topics  . Smoking status: Current Every Day Smoker -- 0.50 packs/day  . Smokeless tobacco: Never Used  . Alcohol Use: Not on file   OB History   Grav Para Term Preterm Abortions TAB SAB Ect Mult Living                 Review of Systems  All systems reviewed and negative, other than as noted in HPI.   Allergies  Review of patient's allergies indicates no known allergies.  Home Medications   Prior to Admission medications   Medication Sig Start Date End Date Taking? Authorizing Provider  amLODipine (NORVASC) 5 MG tablet Take 5 mg by mouth every evening.   Yes Historical Provider, MD  clonazePAM (KLONOPIN) 1 MG tablet Take 0.5-1 mg by mouth 2 (two) times daily as needed for anxiety.   Yes Historical Provider, MD  DULoxetine (CYMBALTA) 60 MG capsule Take 60 mg by mouth every evening.   Yes Historical Provider, MD  gabapentin (NEURONTIN) 100 MG capsule Take 200 mg by mouth at bedtime.   Yes Historical Provider, MD  losartan (COZAAR) 100 MG tablet Take 100 mg by mouth every evening.  Yes Historical Provider, MD  oxyCODONE (ROXICODONE) 15 MG immediate release tablet Take 7.5 mg by mouth every 2 (two) hours as needed for pain.   Yes Historical Provider, MD  pravastatin (PRAVACHOL) 40 MG tablet Take 40 mg by mouth every evening.   Yes Historical Provider, MD  zolpidem (AMBIEN) 10 MG tablet Take 10 mg by mouth at bedtime.   Yes Historical Provider, MD   BP 177/72  Pulse 71  Temp(Src) 98.9 F (37.2 C) (Oral)  Resp 18  SpO2 97% Physical Exam  Nursing note and vitals reviewed. Constitutional: No distress.  Frail appearing and appears older than stated age.  HENT:  Head: Normocephalic and atraumatic.  Eyes: Conjunctivae are normal. Right eye exhibits no discharge. Left eye exhibits no  discharge.  Neck: Neck supple.  Cardiovascular: Normal rate, regular rhythm and normal heart sounds.  Exam reveals no gallop and no friction rub.   No murmur heard. Pulmonary/Chest: Effort normal and breath sounds normal. No respiratory distress.  Abdominal: Soft. She exhibits no distension. There is no tenderness.  Genitourinary:  No CVA tenderness  Musculoskeletal: She exhibits no edema and no tenderness.  Tenderness to palpation in the midline mid to lower lumbar spine. No overlying skin changes. Tenderness over the left SI joint. Tenderness over the left greater trochanter. No significant skin changes in this area either. No significant pain with hip flexion internal rotation. Does report some increased pain with external rotation of her left hip though. Neurovascularly intact distally.  Neurological: She is alert.  Skin: Skin is warm and dry.  Psychiatric: She has a normal mood and affect. Her behavior is normal. Thought content normal.    ED Course  Procedures (including critical care time) Labs Review Labs Reviewed  URINALYSIS, ROUTINE W REFLEX MICROSCOPIC - Abnormal; Notable for the following:    Specific Gravity, Urine 1.004 (*)    All other components within normal limits    Imaging Review Ct Abdomen Pelvis Wo Contrast  07/29/2013   CLINICAL DATA:  Chronic back and left hip pain. Multiple falls. Evaluate for kidney stone versus occult hip fracture.  EXAM: CT ABDOMEN AND PELVIS WITHOUT CONTRAST  TECHNIQUE: Multidetector CT imaging of the abdomen and pelvis was performed following the standard protocol without IV contrast.  COMPARISON:  Lumbar spine radiographs 07/22/2013. Lumbar MRI 02/27/2013. Chest CT 09/15/2010.  FINDINGS: Lung bases: Mild linear scarring or atelectasis at both lung bases. No significant pleural or pericardial effusion.  Kidneys / Ureters / Bladder: As evaluated in the noncontrast state, both kidneys appear normal. There is no hydronephrosis or perinephric soft  tissue stranding. There is no evidence of renal, ureteral or bladder calculus. The bladder is mildly distended without apparent focal abnormality.  Other Solid Abdominal Viscera: As evaluated in the noncontrast state, the liver, spleen, pancreas, gallbladder and adrenal glands appear unremarkable. There is mild extrahepatic biliary dilatation with the common bile duct measuring up to 9 mm in diameter. No calcified intraductal stones identified. The pancreatic duct does not appear dilated.  Bowel/Mesentery: No enteric contrast was administered. The stomach, small bowel, appendix and colon demonstrate no acute findings.  Retroperitoneum/Pelvis: No enlarged abdominal pelvic lymph nodes or ascites demonstrated. There is diffuse atherosclerosis of the aorta, its branches and iliac arteries. The uterus and ovaries appear normal. Bilateral tubal ligation clips are present.  Bones / Musculoskeletal: No acute osseous findings demonstrated. There are mild degenerative changes within the lumbar spine and sacroiliac joints. There is no evidence of proximal femur fracture.  IMPRESSION:  1. No evidence of urinary tract calculus or hydronephrosis. 2. No acute osseous findings.  No evidence of hip fracture. 3. Extrahepatic biliary dilatation is similar to prior lumbar MRIs and potentially physiologic. However, correlation with liver function studies recommended. If abnormal, further evaluation may be warranted. 4. Diffuse atherosclerosis.   Electronically Signed   By: Camie Patience M.D.   On: 07/29/2013 16:37   Dg Hip Complete Left  07/29/2013   CLINICAL DATA:  Left hip pain, 2 weeks duration.  EXAM: LEFT HIP - COMPLETE 2+ VIEW  COMPARISON:  01/18/2010  FINDINGS: Bones of the pelvis appear normal. The patient does have mild osteoarthritis of the right sacroiliac joint. The left appears normal. Both hips appear normal without evidence of degenerative change or other focal finding. Bilateral tubal ligation clips of incidentally  noted. There is also arterial calcification of the iliac arteries and aorta.  IMPRESSION: No hip abnormality seen. Osteoarthritis of the right sacroiliac joint.   Electronically Signed   By: Nelson Chimes M.D.   On: 07/29/2013 14:50     EKG Interpretation None      MDM   Final diagnoses:  Hip pain, left    53 year old female with acute on chronic lower back and left hip pain. No known trauma. Suspect she may potentially have a lumbar compression fracture. She does not appear to have diagnosis of osteopenia/osteoporosis but she fits the typical demographic of older, thin, white female. Previous intermittent steroid usage. No known history of malignancy. Denies any IV drug abuse. No use of blood thinning medication. No acute urinary complaints. No acute neurological complaints and no deficits on exam. Symptoms atypical for UTI or ureteral colic. Pain is reproducible with palpation in the lumbar region, SI joint and over the greater trochanter. Urinalysis w/o hematuria or microscopic findings concerning for possible infection.Burnis Medin obtain a noncontrast CT of the abdomen and pelvis. This will allow evaluation for possible kidney stones, although I doubt this is the etiology. Additionally assess bony structures of the spine, the hip and the pelvis. Will treat symptoms in the meantime. I couldn't identify any particular concerning "red flags" with regards to her symptoms. Anticipate ultimate discharge pending CT results. I do not feel that any further laboratory testing would be of much utility at this time.  CT as above. No concerning or explanatory pathology. Incidental findings, but doesn't clinically correlate to presenting complaint. Deferred further w/u at this time. Needs to follow-up with PCP. Follow-up with pain management with regard to pain complaints. No prescriptions as she is established with pain management.   Virgel Manifold, MD 07/29/13 1714

## 2013-07-29 NOTE — ED Notes (Signed)
Bed: WA13 Expected date:  Expected time:  Means of arrival:  Comments: ems  

## 2013-07-29 NOTE — Discharge Instructions (Signed)

## 2013-07-29 NOTE — ED Notes (Addendum)
Per EMS, pt was sent from doctor's office to be evaluated for left sided kidney stone vs occult hip fracture (pt has hx of falling while sleep walking). Pt has chronic pain in her back and hip, but states her pain has gotten worse over the past 2 weeks. Pt has not had relief in pain from dilaudid/oxycodone, hot/cold therapy. Pt denies change in urinary status. Pt A&Ox 4. Pt hypertensive on arrival, 177/72. Pt has not taken BP meds today.

## 2013-07-30 ENCOUNTER — Other Ambulatory Visit: Payer: Self-pay | Admitting: Internal Medicine

## 2013-09-25 ENCOUNTER — Encounter: Payer: Self-pay | Admitting: Physician Assistant

## 2013-09-25 ENCOUNTER — Ambulatory Visit (INDEPENDENT_AMBULATORY_CARE_PROVIDER_SITE_OTHER): Payer: BC Managed Care – PPO | Admitting: Physician Assistant

## 2013-09-25 ENCOUNTER — Ambulatory Visit: Payer: BC Managed Care – PPO | Admitting: Physician Assistant

## 2013-09-25 VITALS — BP 142/82 | HR 72 | Temp 98.4°F | Resp 18 | Wt 97.2 lb

## 2013-09-25 DIAGNOSIS — F329 Major depressive disorder, single episode, unspecified: Secondary | ICD-10-CM

## 2013-09-25 DIAGNOSIS — M545 Low back pain, unspecified: Secondary | ICD-10-CM

## 2013-09-25 DIAGNOSIS — F3289 Other specified depressive episodes: Secondary | ICD-10-CM

## 2013-09-25 DIAGNOSIS — I10 Essential (primary) hypertension: Secondary | ICD-10-CM

## 2013-09-25 MED ORDER — LOSARTAN POTASSIUM 100 MG PO TABS: 100.0000 mg | ORAL_TABLET | Freq: Every day | ORAL | Status: DC

## 2013-09-25 MED ORDER — GABAPENTIN 100 MG PO CAPS: 200.0000 mg | ORAL_CAPSULE | Freq: Every day | ORAL | Status: DC

## 2013-09-25 MED ORDER — DULOXETINE HCL 60 MG PO CPEP: 60.0000 mg | ORAL_CAPSULE | Freq: Every evening | ORAL | Status: DC

## 2013-09-25 NOTE — Progress Notes (Signed)
Subjective:    Patient ID: [name] [name], female    DOB: 07-Dec-1960, 53 y.o.   MRN: [id number]  HPI Patient is a 53 y.o. female presenting for follow up and medication management. Pt states that she had a recent CT scan of her abdomen to evaluate for possible kidney stones, and incidentally found to have an enlarged bile duct. She states that she saw her orthopedist last week, and he brought the incidental finding to the pts attention, and told her that she should have liver function tests done.   Chronic back and Hip pain- Pt sees a pain specialist who prescribes various opioid analgesics. She has gabapentin prescribed by primary to help augment pain control. Tolerating the medication well and denies any adverse effects to treatment.  Depression- Pt takes Cymbalta for this. She states that she tolerates this well and denies adverse effects to treatment. The pt denies any helplessness, hopelessness, suicidal ideation or homicidal ideation.   HTN- patient states that she has not taken any blood pressure medicine for the past 6 or 7 months. She states that she did so because she had been having normal blood pressures. She states she has not checked her blood pressure recently. Previously she had been taking losartan 100 mg daily and amlodipine 5 mg daily. Patient denies fevers, chills, nausea, vomiting, diarrhea, shortness of breath, chest pain, headache, syncope.  Review of Systems As per HPI and are otherwise negative.    Past Medical History  Diagnosis Date  . Hyperlipidemia   . Hypertension   . Atypical chest pain   . Depression   . Tobacco abuse   . Chronic insomnia   . PVD (peripheral vascular disease)     segmental femeropoliteal disease    History   Social History  . Marital Status: Single    Spouse Name: N/A    Number of Children: N/A  . Years of Education: N/A   Occupational History  . Not on file.   Social History Main Topics  . Smoking status: Current Every Day  Smoker -- 0.50 packs/day  . Smokeless tobacco: Never Used  . Alcohol Use: Not on file  . Drug Use: Not on file  . Sexual Activity: Not on file   Other Topics Concern  . Not on file   Social History Narrative   Divorced with 4 children    Current Smoker 1ppd    No alcohol   No illicit drug use      Occupation: Tax preparation        Past Surgical History  Procedure Laterality Date  . Wrist surgery  2005    right wrist  . Cesarean section  1990  . Nasal sinus surgery  1999  . Bunionectomy  1996    bilateral  . Cervical discectomy  06/21/2006     and fusion   . Endometrial ablation  June 2010  . Tubal ligation      Family History  Problem Relation Age of Onset  . Alcohol abuse Father     deceased age 87  . Emphysema Mother     deceased age 58  . ADD / ADHD Son     No Known Allergies  Current Outpatient Prescriptions on File Prior to Visit  Medication Sig Dispense Refill  . amLODipine (NORVASC) 5 MG tablet Take 5 mg by mouth every evening.      . clonazePAM (KLONOPIN) 1 MG tablet TAKE 1 TABLET BY MOUTH ONCE DAILY AS NEEDED FOR ANXIETY.  30 tablet  3  . DULoxetine (CYMBALTA) 60 MG capsule Take 60 mg by mouth every evening.      . gabapentin (NEURONTIN) 100 MG capsule Take 200 mg by mouth at bedtime.      Marland Kitchen losartan (COZAAR) 100 MG tablet Take 100 mg by mouth every evening.      Marland Kitchen oxyCODONE (ROXICODONE) 15 MG immediate release tablet Take 7.5 mg by mouth every 2 (two) hours as needed for pain.      . pravastatin (PRAVACHOL) 40 MG tablet Take 40 mg by mouth every evening.      . zolpidem (AMBIEN) 10 MG tablet Take 10 mg by mouth at bedtime.       No current facility-administered medications on file prior to visit.   The PFS history was reviewed with the pt at time of visit.  EXAM: BP 142/82  Pulse 72  Temp(Src) 98.4 F (36.9 C) (Oral)  Resp 18  Wt 97 lb 3.2 oz (44.09 kg)     Objective:   Physical Exam  Nursing note and vitals reviewed. Constitutional: She  is oriented to person, place, and time. She appears well-developed and well-nourished. No distress.  HENT:  Head: Normocephalic and atraumatic.  Eyes: Conjunctivae and EOM are normal. Pupils are equal, round, and reactive to light.  Neck: Normal range of motion.  Cardiovascular: Normal rate, regular rhythm and intact distal pulses.   Pulmonary/Chest: Effort normal and breath sounds normal. No respiratory distress. She exhibits no tenderness.  Musculoskeletal: Normal range of motion. She exhibits no edema and no tenderness.  Neurological: She is alert and oriented to person, place, and time.  Skin: Skin is warm and dry. No rash noted. She is not diaphoretic. No erythema. No pallor.  Psychiatric: She has a normal mood and affect. Her behavior is normal. Judgment and thought content normal.     Lab Results  Component Value Date   WBC 12.4* 11/14/2012   HGB 13.4 11/14/2012   HCT 39.5 11/14/2012   PLT 270.0 11/14/2012   GLUCOSE 81 11/14/2012   CHOL 101 11/14/2012   TRIG 81.0 11/14/2012   HDL 32.20* 11/14/2012   LDLCALC 53 11/14/2012   ALT 13 11/14/2012   AST 15 11/14/2012   NA 134* 11/14/2012   K 3.2* 11/14/2012   CL 99 11/14/2012   CREATININE 0.7 11/14/2012   BUN 7 11/14/2012   CO2 29 11/14/2012   TSH 1.97 11/14/2012   INR 1.0 RATIO 11/19/2007        Assessment & Plan:  [name] [name] was seen today for follow-up.  Diagnoses and associated orders for this visit:  Unspecified essential hypertension Comments: Restart losartan, check home BPs, reassess in 1 month. - losartan (COZAAR) 100 MG tablet; Take 1 tablet (100 mg total) by mouth daily. - CMP with eGFR  Low back pain, unspecified back pain laterality, with sciatica presence unspecified Comments: Stable on gabapentin. will continue. - gabapentin (NEURONTIN) 100 MG capsule; Take 2 capsules (200 mg total) by mouth at bedtime.  DEPRESSION Comments: Stable on duloxetine, will continue. - DULoxetine (CYMBALTA) 60 MG capsule;  Take 1 capsule (60 mg total) by mouth every evening.    I spoke with Dr. Burnice Logan regarding this patient, and I agree with him that as the patient has not been on hypertension treatment for several months, however has elevated blood pressure, we will reinitiate the losartan only. Will have patient followup in one month to reassess. Patient is amenable to this plan.  Return precautions provided, and  patient handout on HTN.  Plan to follow up as needed, or for worsening or persistent symptoms despite treatment.  Patient Instructions  Continue taking your current medications as directed.  You need to restart your blood pressure medication. We will start by reinitiating Losartan alone, taken daily.   We will call you with your lab results are available.  If emergency symptoms discussed during visit developed, seek medical attention immediately.  Followup in 1 month with PCP to reassess, or for worsening or persistent symptoms despite treatment.

## 2013-09-25 NOTE — Progress Notes (Signed)
Pre visit review using our clinic review tool, if applicable. No additional management support is needed unless otherwise documented below in the visit note. 

## 2013-09-25 NOTE — Patient Instructions (Addendum)
Continue taking your current medications as directed.  You need to restart your blood pressure medication. We will start by reinitiating Losartan alone, taken daily.   We will call you with your lab results are available.  If emergency symptoms discussed during visit developed, seek medical attention immediately.  Followup in 1 month with PCP to reassess, or for worsening or persistent symptoms despite treatment.     Hypertension Hypertension is another name for high blood pressure. High blood pressure forces your heart to work harder to pump blood. A blood pressure reading has two numbers, which includes a higher number over a lower number (example: 110/72). HOME CARE   Have your blood pressure rechecked by your doctor.  Only take medicine as told by your doctor. Follow the directions carefully. The medicine does not work as well if you skip doses. Skipping doses also puts you at risk for problems.  Do not smoke.  Monitor your blood pressure at home as told by your doctor. GET HELP IF:  You think you are having a reaction to the medicine you are taking.  You have repeat headaches or feel dizzy.  You have puffiness (swelling) in your ankles.  You have trouble with your vision. GET HELP RIGHT AWAY IF:   You get a very bad headache and are confused.  You feel weak, numb, or faint.  You get chest or belly (abdominal) pain.  You throw up (vomit).  You cannot breathe very well. MAKE SURE YOU:   Understand these instructions.  Will watch your condition.  Will get help right away if you are not doing well or get worse. Document Released: 06/06/2007 Document Revised: 12/23/2012 Document Reviewed: 10/10/2012 [facility] Patient Information 2015 [place], [place]. This information is not intended to replace advice given to you by your health care provider. Make sure you discuss any questions you have with your health care provider.

## 2013-09-26 LAB — COMPLETE METABOLIC PANEL WITH GFR
ALBUMIN: 3.9 g/dL (ref 3.5–5.2)
ALT: 12 U/L (ref 0–35)
AST: 12 U/L (ref 0–37)
Alkaline Phosphatase: 100 U/L (ref 39–117)
BUN: 5 mg/dL — ABNORMAL LOW (ref 6–23)
CALCIUM: 9.8 mg/dL (ref 8.4–10.5)
CO2: 28 meq/L (ref 19–32)
Chloride: 102 mEq/L (ref 96–112)
Creat: 0.75 mg/dL (ref 0.50–1.10)
GLUCOSE: 94 mg/dL (ref 70–99)
POTASSIUM: 5 meq/L (ref 3.5–5.3)
Sodium: 139 mEq/L (ref 135–145)
TOTAL PROTEIN: 6.5 g/dL (ref 6.0–8.3)
Total Bilirubin: 0.5 mg/dL (ref 0.2–1.2)

## 2013-10-01 ENCOUNTER — Other Ambulatory Visit: Payer: Self-pay | Admitting: Internal Medicine

## 2013-10-28 ENCOUNTER — Telehealth: Payer: Self-pay | Admitting: Internal Medicine

## 2013-10-28 DIAGNOSIS — M545 Low back pain: Secondary | ICD-10-CM

## 2013-10-28 MED ORDER — GABAPENTIN 100 MG PO CAPS: 200.0000 mg | ORAL_CAPSULE | Freq: Every day | ORAL | Status: DC

## 2013-10-28 NOTE — Telephone Encounter (Signed)
[id number] [place], [place] - [address] is requesting re-fill on gabapentin (NEURONTIN) 100 MG capsule

## 2013-10-28 NOTE — Telephone Encounter (Signed)
rx sent in electronically 

## 2013-11-05 ENCOUNTER — Ambulatory Visit: Payer: BC Managed Care – PPO | Admitting: Internal Medicine

## 2013-11-12 ENCOUNTER — Ambulatory Visit: Payer: BC Managed Care – PPO | Admitting: Internal Medicine

## 2013-11-13 ENCOUNTER — Ambulatory Visit: Payer: BC Managed Care – PPO | Admitting: Internal Medicine

## 2013-12-01 ENCOUNTER — Other Ambulatory Visit (HOSPITAL_COMMUNITY): Payer: Self-pay | Admitting: Pain Medicine

## 2013-12-01 DIAGNOSIS — M160 Bilateral primary osteoarthritis of hip: Secondary | ICD-10-CM

## 2013-12-03 ENCOUNTER — Other Ambulatory Visit: Payer: Self-pay | Admitting: Internal Medicine

## 2013-12-11 ENCOUNTER — Ambulatory Visit (HOSPITAL_COMMUNITY): Payer: BC Managed Care – PPO

## 2013-12-11 ENCOUNTER — Ambulatory Visit (HOSPITAL_COMMUNITY): Admission: RE | Admit: 2013-12-11 | Payer: BC Managed Care – PPO | Source: Ambulatory Visit

## 2013-12-21 ENCOUNTER — Ambulatory Visit (HOSPITAL_COMMUNITY)
Admission: RE | Admit: 2013-12-21 | Discharge: 2013-12-21 | Disposition: A | Discharge status: Home / Self Care | Payer: BC Managed Care – PPO | Source: Ambulatory Visit | Attending: Pain Medicine | Admitting: Pain Medicine

## 2013-12-21 DIAGNOSIS — M160 Bilateral primary osteoarthritis of hip: Secondary | ICD-10-CM | POA: Diagnosis present

## 2014-01-31 ENCOUNTER — Other Ambulatory Visit: Payer: Self-pay | Admitting: Internal Medicine

## 2014-02-04 NOTE — Telephone Encounter (Signed)
Ok per Dr. Shawna Orleans to refill medication x 3 rf.  Rx called in to pharmacy.

## 2014-03-26 ENCOUNTER — Other Ambulatory Visit: Payer: Self-pay | Admitting: Orthopedic Surgery

## 2014-03-26 DIAGNOSIS — G8929 Other chronic pain: Secondary | ICD-10-CM

## 2014-03-26 DIAGNOSIS — M533 Sacrococcygeal disorders, not elsewhere classified: Principal | ICD-10-CM

## 2014-04-27 ENCOUNTER — Other Ambulatory Visit: Payer: Self-pay

## 2014-05-24 ENCOUNTER — Telehealth: Payer: Self-pay

## 2014-05-24 NOTE — Telephone Encounter (Signed)
Unable to leave message concerning need for mammogram

## 2014-06-06 ENCOUNTER — Other Ambulatory Visit: Payer: Self-pay | Admitting: Internal Medicine

## 2014-07-06 ENCOUNTER — Telehealth: Payer: Self-pay | Admitting: Internal Medicine

## 2014-07-07 NOTE — Telephone Encounter (Signed)
error 

## 2014-07-16 ENCOUNTER — Ambulatory Visit (INDEPENDENT_AMBULATORY_CARE_PROVIDER_SITE_OTHER): Payer: BLUE CROSS/BLUE SHIELD | Admitting: Internal Medicine

## 2014-07-16 ENCOUNTER — Encounter: Payer: Self-pay | Admitting: Internal Medicine

## 2014-07-16 VITALS — BP 150/78 | HR 74 | Temp 99.4°F | Wt 93.8 lb

## 2014-07-16 DIAGNOSIS — I700 Atherosclerosis of aorta: Secondary | ICD-10-CM | POA: Insufficient documentation

## 2014-07-16 DIAGNOSIS — E785 Hyperlipidemia, unspecified: Secondary | ICD-10-CM | POA: Diagnosis not present

## 2014-07-16 DIAGNOSIS — I10 Essential (primary) hypertension: Secondary | ICD-10-CM | POA: Diagnosis not present

## 2014-07-16 DIAGNOSIS — Z780 Asymptomatic menopausal state: Secondary | ICD-10-CM | POA: Diagnosis not present

## 2014-07-16 DIAGNOSIS — F4323 Adjustment disorder with mixed anxiety and depressed mood: Secondary | ICD-10-CM | POA: Insufficient documentation

## 2014-07-16 DIAGNOSIS — Z1239 Encounter for other screening for malignant neoplasm of breast: Secondary | ICD-10-CM

## 2014-07-16 DIAGNOSIS — G4700 Insomnia, unspecified: Secondary | ICD-10-CM

## 2014-07-16 MED ORDER — PRAVASTATIN SODIUM 40 MG PO TABS: 40.0000 mg | ORAL_TABLET | Freq: Every evening | ORAL | Status: DC

## 2014-07-16 MED ORDER — ZOLPIDEM TARTRATE 10 MG PO TABS: 10.0000 mg | ORAL_TABLET | Freq: Every day | ORAL | Status: DC

## 2014-07-16 MED ORDER — CLONAZEPAM 1 MG PO TABS: ORAL_TABLET | ORAL | Status: DC

## 2014-07-16 NOTE — Patient Instructions (Signed)
Please schedule next office visit as CPX Please complete the following lab tests before your next follow up appointment: CPX labs

## 2014-07-16 NOTE — Progress Notes (Signed)
Subjective:    Patient ID: [name] [name], female    DOB: 1960-11-27, 54 y.o.   MRN: [id number]  HPI  54 year old white female with history of hypertension, chronic low back pain (followed by pain management) and tobacco use for routine follow-up. Has been over a year since her last visit. Patient reports she is working with Dr. Prince Solian for pain management. She is currently taking oxycodone 15 mg one half tablet every 2 hours. She reports taking 7-8 doses per day to control her chronic back pain. She has history of chronic anxiety and insomnia. She uses clonazepam 0.5 mg Once to twice daily as needed. She has been using Ambien 10 mg at bedtime as needed. She denies any excessive somnolence.  Hypertension-she has not taken her antihypertensives for over a year. She reports normal blood pressure readings without taking blood pressure medication.  Hyperlipidemia-she has not taken pravastatin for over a year.  We reviewed previous CT of abdomen and pelvis. There was note of atherosclerosis of the aorta, its branches and iliac arteries.  Her boyfriend passed away from complications of alcohol abuse.  Review of Systems Negative for chest pain or shortness of breath    Past Medical History  Diagnosis Date  . Hyperlipidemia   . Hypertension   . Atypical chest pain   . Depression   . Tobacco abuse   . Chronic insomnia   . PVD (peripheral vascular disease)     segmental femeropoliteal disease    History   Social History  . Marital Status: Single    Spouse Name: N/A  . Number of Children: N/A  . Years of Education: N/A   Occupational History  . Not on file.   Social History Main Topics  . Smoking status: Current Every Day Smoker -- 0.50 packs/day  . Smokeless tobacco: Never Used  . Alcohol Use: Not on file  . Drug Use: Not on file  . Sexual Activity: Not on file   Other Topics Concern  . Not on file   Social History Narrative   Divorced with 4 children    Current  Smoker 1ppd    No alcohol   No illicit drug use      Occupation: Tax preparation        Past Surgical History  Procedure Laterality Date  . Wrist surgery  2005    right wrist  . Cesarean section  1990  . Nasal sinus surgery  1999  . Bunionectomy  1996    bilateral  . Cervical discectomy  06/21/2006     and fusion   . Endometrial ablation  June 2010  . Tubal ligation      Family History  Problem Relation Age of Onset  . Alcohol abuse Father     deceased age 25  . Emphysema Mother     deceased age 18  . ADD / ADHD Son     No Known Allergies  Current Outpatient Prescriptions on File Prior to Visit  Medication Sig Dispense Refill  . clonazePAM (KLONOPIN) 1 MG tablet TAKE 1 TABLET BY MOUTH AS NEEDED FOR ANXIETY 30 tablet 0  . losartan (COZAAR) 100 MG tablet Take 1 tablet (100 mg total) by mouth daily. 90 tablet 3  . oxyCODONE (ROXICODONE) 15 MG immediate release tablet Take 7.5 mg by mouth every 2 (two) hours as needed for pain.    . pravastatin (PRAVACHOL) 40 MG tablet Take 40 mg by mouth every evening.    . zolpidem (  AMBIEN) 10 MG tablet TAKE 1 TABLET BY MOUTH EVERY DAY AT BEDTIME 30 tablet 0  . amLODipine (NORVASC) 5 MG tablet Take 5 mg by mouth every evening.     No current facility-administered medications on file prior to visit.    BP 150/78 mmHg  Pulse 74  Temp(Src) 99.4 F (37.4 C) (Oral)  Wt 93 lb 12.8 oz (42.547 kg)    Objective:   Physical Exam  Constitutional: She is oriented to person, place, and time.  HENT:  Head: Normocephalic and atraumatic.  Cardiovascular: Normal rate, regular rhythm and normal heart sounds.   No murmur heard. Pulmonary/Chest: Effort normal and breath sounds normal. She has no wheezes.  Musculoskeletal: She exhibits no edema.  Neurological: She is alert and oriented to person, place, and time. No cranial nerve deficit.  Skin: Skin is warm and dry.  Psychiatric: She has a normal mood and affect. Her behavior is normal.           Assessment & Plan:

## 2014-07-16 NOTE — Assessment & Plan Note (Signed)
Continue same dose of Ambien.  She denies excessive somnolence.  We discussed risks of sedative use along with narcotics.

## 2014-07-16 NOTE — Assessment & Plan Note (Signed)
Her pain mgt specialist prescribes Paxil.  No change in clonazepam dose.  She denies excessive somnolence.

## 2014-07-16 NOTE — Progress Notes (Signed)
Pre visit review using our clinic review tool, if applicable. No additional management support is needed unless otherwise documented below in the visit note. 

## 2014-07-16 NOTE — Assessment & Plan Note (Signed)
Patient advised to restart pravastatin.

## 2014-07-16 NOTE — Assessment & Plan Note (Signed)
Patient has not taken her Norvasc or losartan for over a year. She reports normal blood pressure readings at home. Patient advised to keep a log of home BP readings for 1 month.

## 2014-07-16 NOTE — Assessment & Plan Note (Signed)
Restart pravastatin.  Smoking cessation encouraged.

## 2014-08-03 ENCOUNTER — Encounter: Payer: Self-pay | Admitting: Internal Medicine

## 2014-10-05 ENCOUNTER — Other Ambulatory Visit: Payer: Self-pay | Admitting: Internal Medicine

## 2014-10-31 ENCOUNTER — Encounter: Payer: Self-pay | Admitting: Emergency Medicine

## 2014-10-31 ENCOUNTER — Inpatient Hospital Stay
Admission: EM | Admit: 2014-10-31 | Discharge: 2014-11-01 | DRG: 287 | Disposition: A | Discharge status: Home / Self Care | Payer: BLUE CROSS/BLUE SHIELD | Attending: Internal Medicine | Admitting: Internal Medicine

## 2014-10-31 ENCOUNTER — Emergency Department: Payer: BLUE CROSS/BLUE SHIELD

## 2014-10-31 DIAGNOSIS — G8929 Other chronic pain: Secondary | ICD-10-CM | POA: Diagnosis present

## 2014-10-31 DIAGNOSIS — I10 Essential (primary) hypertension: Secondary | ICD-10-CM | POA: Diagnosis present

## 2014-10-31 DIAGNOSIS — E871 Hypo-osmolality and hyponatremia: Secondary | ICD-10-CM

## 2014-10-31 DIAGNOSIS — Z716 Tobacco abuse counseling: Secondary | ICD-10-CM

## 2014-10-31 DIAGNOSIS — Z7982 Long term (current) use of aspirin: Secondary | ICD-10-CM

## 2014-10-31 DIAGNOSIS — E785 Hyperlipidemia, unspecified: Secondary | ICD-10-CM | POA: Diagnosis present

## 2014-10-31 DIAGNOSIS — Z825 Family history of asthma and other chronic lower respiratory diseases: Secondary | ICD-10-CM | POA: Diagnosis not present

## 2014-10-31 DIAGNOSIS — M549 Dorsalgia, unspecified: Secondary | ICD-10-CM | POA: Diagnosis present

## 2014-10-31 DIAGNOSIS — Z981 Arthrodesis status: Secondary | ICD-10-CM

## 2014-10-31 DIAGNOSIS — F5104 Psychophysiologic insomnia: Secondary | ICD-10-CM | POA: Diagnosis present

## 2014-10-31 DIAGNOSIS — E860 Dehydration: Secondary | ICD-10-CM

## 2014-10-31 DIAGNOSIS — F17210 Nicotine dependence, cigarettes, uncomplicated: Secondary | ICD-10-CM | POA: Diagnosis present

## 2014-10-31 DIAGNOSIS — I248 Other forms of acute ischemic heart disease: Secondary | ICD-10-CM | POA: Diagnosis not present

## 2014-10-31 DIAGNOSIS — F329 Major depressive disorder, single episode, unspecified: Secondary | ICD-10-CM | POA: Diagnosis present

## 2014-10-31 DIAGNOSIS — I214 Non-ST elevation (NSTEMI) myocardial infarction: Secondary | ICD-10-CM | POA: Diagnosis present

## 2014-10-31 DIAGNOSIS — I739 Peripheral vascular disease, unspecified: Secondary | ICD-10-CM | POA: Diagnosis present

## 2014-10-31 DIAGNOSIS — R778 Other specified abnormalities of plasma proteins: Secondary | ICD-10-CM

## 2014-10-31 DIAGNOSIS — Z79899 Other long term (current) drug therapy: Secondary | ICD-10-CM

## 2014-10-31 DIAGNOSIS — Z9851 Tubal ligation status: Secondary | ICD-10-CM

## 2014-10-31 DIAGNOSIS — Z9889 Other specified postprocedural states: Secondary | ICD-10-CM

## 2014-10-31 DIAGNOSIS — R112 Nausea with vomiting, unspecified: Secondary | ICD-10-CM

## 2014-10-31 DIAGNOSIS — R079 Chest pain, unspecified: Secondary | ICD-10-CM | POA: Insufficient documentation

## 2014-10-31 DIAGNOSIS — I2510 Atherosclerotic heart disease of native coronary artery without angina pectoris: Secondary | ICD-10-CM | POA: Diagnosis present

## 2014-10-31 DIAGNOSIS — Z811 Family history of alcohol abuse and dependence: Secondary | ICD-10-CM | POA: Diagnosis not present

## 2014-10-31 DIAGNOSIS — R7989 Other specified abnormal findings of blood chemistry: Secondary | ICD-10-CM

## 2014-10-31 HISTORY — DX: Dorsalgia, unspecified: M54.9

## 2014-10-31 LAB — PROTIME-INR
INR: 1.02
Prothrombin Time: 13.6 seconds (ref 11.4–15.0)

## 2014-10-31 LAB — CBC
HCT: 47.3 % — ABNORMAL HIGH (ref 35.0–47.0)
HEMOGLOBIN: 15.8 g/dL (ref 12.0–16.0)
MCH: 28.4 pg (ref 26.0–34.0)
MCHC: 33.4 g/dL (ref 32.0–36.0)
MCV: 84.9 fL (ref 80.0–100.0)
Platelets: 283 10*3/uL (ref 150–440)
RBC: 5.57 MIL/uL — ABNORMAL HIGH (ref 3.80–5.20)
RDW: 15.1 % — AB (ref 11.5–14.5)
WBC: 16 10*3/uL — ABNORMAL HIGH (ref 3.6–11.0)

## 2014-10-31 LAB — URINALYSIS COMPLETE WITH MICROSCOPIC (ARMC ONLY)
Bilirubin Urine: NEGATIVE
GLUCOSE, UA: 50 mg/dL — AB
NITRITE: NEGATIVE
Protein, ur: 100 mg/dL — AB
Specific Gravity, Urine: 1.009 (ref 1.005–1.030)
pH: 6 (ref 5.0–8.0)

## 2014-10-31 LAB — TROPONIN I
TROPONIN I: 0.25 ng/mL — AB (ref <0.031)
Troponin I: 0.25 ng/mL — ABNORMAL HIGH (ref <0.031)
Troponin I: 0.24 ng/mL — ABNORMAL HIGH (ref <0.031)

## 2014-10-31 LAB — COMPREHENSIVE METABOLIC PANEL
ALT: 10 U/L — ABNORMAL LOW (ref 14–54)
ANION GAP: 12 (ref 5–15)
AST: 16 U/L (ref 15–41)
Albumin: 3.9 g/dL (ref 3.5–5.0)
Alkaline Phosphatase: 92 U/L (ref 38–126)
BILIRUBIN TOTAL: 1.3 mg/dL — AB (ref 0.3–1.2)
BUN: 16 mg/dL (ref 6–20)
CHLORIDE: 100 mmol/L — AB (ref 101–111)
CO2: 20 mmol/L — AB (ref 22–32)
Calcium: 9.2 mg/dL (ref 8.9–10.3)
Creatinine, Ser: 0.69 mg/dL (ref 0.44–1.00)
GFR calc Af Amer: >60 mL/min (ref >60)
GFR calc non Af Amer: >60 mL/min (ref >60)
Glucose, Bld: 116 mg/dL — ABNORMAL HIGH (ref 65–99)
Potassium: 3.7 mmol/L (ref 3.5–5.1)
SODIUM: 132 mmol/L — AB (ref 135–145)
TOTAL PROTEIN: 7 g/dL (ref 6.5–8.1)

## 2014-10-31 LAB — HEMOGLOBIN A1C: Hgb A1c MFr Bld: 5.7 % (ref 4.0–6.0)

## 2014-10-31 LAB — HEPARIN LEVEL (UNFRACTIONATED)

## 2014-10-31 LAB — MAGNESIUM: Magnesium: 1.4 mg/dL — ABNORMAL LOW (ref 1.7–2.4)

## 2014-10-31 LAB — LIPASE, BLOOD: Lipase: 23 U/L (ref 11–51)

## 2014-10-31 LAB — APTT: aPTT: 29 seconds (ref 24–36)

## 2014-10-31 MED ORDER — OXYCODONE-ACETAMINOPHEN 5-325 MG PO TABS
ORAL_TABLET | ORAL | Status: AC
  Filled 2014-10-31: qty 1

## 2014-10-31 MED ORDER — ASPIRIN EC 325 MG PO TBEC: 325.0000 mg | DELAYED_RELEASE_TABLET | Freq: Every day | ORAL | Status: DC

## 2014-10-31 MED ORDER — SODIUM CHLORIDE 0.9 % IJ SOLN: 3.0000 mL | INTRAMUSCULAR | Status: DC | PRN

## 2014-10-31 MED ORDER — METOPROLOL TARTRATE 25 MG PO TABS
12.5000 mg | ORAL_TABLET | Freq: Two times a day (BID) | ORAL | Status: DC
  Administered 2014-10-31 (×2): 12.5 mg via ORAL
  Filled 2014-10-31 (×2): qty 1

## 2014-10-31 MED ORDER — OXYCODONE HCL 5 MG PO TABS
ORAL_TABLET | ORAL | Status: AC
  Filled 2014-10-31: qty 1

## 2014-10-31 MED ORDER — ASPIRIN 81 MG PO CHEW
324.0000 mg | CHEWABLE_TABLET | Freq: Once | ORAL | Status: AC
  Administered 2014-10-31: 324 mg via ORAL
  Filled 2014-10-31: qty 4

## 2014-10-31 MED ORDER — HEPARIN (PORCINE) IN NACL 100-0.45 UNIT/ML-% IJ SOLN
800.0000 [IU]/h | INTRAMUSCULAR | Status: DC
Start: 2014-10-31 — End: 2014-11-01
  Administered 2014-10-31: 650 [IU]/h via INTRAVENOUS
  Administered 2014-10-31: 500 [IU]/h via INTRAVENOUS
  Filled 2014-10-31 (×2): qty 250

## 2014-10-31 MED ORDER — SODIUM CHLORIDE 0.9 % WEIGHT BASED INFUSION
1.0000 mL/kg/h | INTRAVENOUS | Status: DC
  Administered 2014-11-01: 1 mL/kg/h via INTRAVENOUS

## 2014-10-31 MED ORDER — HEPARIN BOLUS VIA INFUSION
1200.0000 [IU] | Freq: Once | INTRAVENOUS | Status: AC
  Administered 2014-10-31: 1200 [IU] via INTRAVENOUS
  Filled 2014-10-31: qty 1200

## 2014-10-31 MED ORDER — ONDANSETRON HCL 4 MG/2ML IJ SOLN
4.0000 mg | Freq: Once | INTRAMUSCULAR | Status: AC
  Administered 2014-10-31: 4 mg via INTRAVENOUS
  Filled 2014-10-31: qty 2

## 2014-10-31 MED ORDER — LOSARTAN POTASSIUM 50 MG PO TABS
100.0000 mg | ORAL_TABLET | Freq: Every day | ORAL | Status: DC
  Administered 2014-10-31: 100 mg via ORAL
  Filled 2014-10-31: qty 2

## 2014-10-31 MED ORDER — CLONAZEPAM 0.5 MG PO TABS
0.2500 mg | ORAL_TABLET | Freq: Two times a day (BID) | ORAL | Status: DC | PRN
  Administered 2014-11-01: 0.25 mg via ORAL
  Filled 2014-10-31: qty 1

## 2014-10-31 MED ORDER — HEPARIN BOLUS VIA INFUSION
2400.0000 [IU] | Freq: Once | INTRAVENOUS | Status: AC
  Administered 2014-10-31: 2400 [IU] via INTRAVENOUS
  Filled 2014-10-31: qty 2400

## 2014-10-31 MED ORDER — SODIUM CHLORIDE 0.9 % IJ SOLN
3.0000 mL | Freq: Two times a day (BID) | INTRAMUSCULAR | Status: DC
  Administered 2014-10-31: 3 mL via INTRAVENOUS

## 2014-10-31 MED ORDER — OXYCODONE-ACETAMINOPHEN 10-325 MG PO TABS: 1.0000 | ORAL_TABLET | ORAL | Status: DC

## 2014-10-31 MED ORDER — SODIUM CHLORIDE 0.9 % IV BOLUS (SEPSIS)
1000.0000 mL | INTRAVENOUS | Status: AC
  Administered 2014-10-31: 1000 mL via INTRAVENOUS

## 2014-10-31 MED ORDER — SODIUM CHLORIDE 0.9 % IV SOLN: 250.0000 mL | INTRAVENOUS | Status: DC | PRN

## 2014-10-31 MED ORDER — SODIUM CHLORIDE 0.9 % IJ SOLN: 3.0000 mL | Freq: Two times a day (BID) | INTRAMUSCULAR | Status: DC

## 2014-10-31 MED ORDER — SODIUM CHLORIDE 0.9 % WEIGHT BASED INFUSION
3.0000 mL/kg/h | INTRAVENOUS | Status: AC
  Administered 2014-11-01: 3 mL/kg/h via INTRAVENOUS

## 2014-10-31 MED ORDER — ZOLPIDEM TARTRATE 5 MG PO TABS
5.0000 mg | ORAL_TABLET | Freq: Every day | ORAL | Status: DC
  Administered 2014-10-31: 5 mg via ORAL
  Filled 2014-10-31: qty 1

## 2014-10-31 MED ORDER — PAROXETINE HCL 20 MG PO TABS
20.0000 mg | ORAL_TABLET | Freq: Every day | ORAL | Status: DC
  Administered 2014-10-31: 20 mg via ORAL
  Filled 2014-10-31: qty 1

## 2014-10-31 MED ORDER — GABAPENTIN 300 MG PO CAPS: 300.0000 mg | ORAL_CAPSULE | Freq: Two times a day (BID) | ORAL | Status: DC | PRN

## 2014-10-31 MED ORDER — OXYCODONE HCL 5 MG PO TABS
5.0000 mg | ORAL_TABLET | Freq: Four times a day (QID) | ORAL | Status: DC | PRN
  Administered 2014-10-31 – 2014-11-01 (×4): 5 mg via ORAL
  Filled 2014-10-31 (×3): qty 1

## 2014-10-31 MED ORDER — PRAVASTATIN SODIUM 40 MG PO TABS
40.0000 mg | ORAL_TABLET | Freq: Every evening | ORAL | Status: DC
  Administered 2014-10-31: 40 mg via ORAL
  Filled 2014-10-31: qty 1

## 2014-10-31 MED ORDER — OXYCODONE-ACETAMINOPHEN 5-325 MG PO TABS
1.0000 | ORAL_TABLET | Freq: Four times a day (QID) | ORAL | Status: DC | PRN
  Administered 2014-10-31 – 2014-11-01 (×4): 1 via ORAL
  Filled 2014-10-31 (×3): qty 1

## 2014-10-31 NOTE — Progress Notes (Signed)
[name] [name] is a 54 y.o. female  [id number]  Primary Cardiologist: Neoma Laming Reason for Consultation: Non-STEMI  HPI: This is a 54 year old white female with a past medical history hyperlipidemia hypertension heavy smoker and the mature coronary artery disease with death of both parents in the 74s presented to the emergency room with nausea vomiting and palpitation. Patient denied any diarrhea or abdominal pain or fever but complained of having heart rate racing for the past couple of days and got worse today thus she came to the emergency room.   Review of Systems: No chest pain no orthopnea PND or leg swelling   Past Medical History  Diagnosis Date  . Hyperlipidemia   . Hypertension   . Atypical chest pain   . Depression   . Tobacco abuse   . Chronic insomnia   . PVD (peripheral vascular disease) ([place])     segmental femeropoliteal disease  . Back pain     chronic    Medications Prior to Admission  Medication Sig Dispense Refill  . clonazePAM (KLONOPIN) 1 MG tablet TAKE 1/2 TABLET BY MOUTH ONCE TO TWICE DAILY AS NEEDED FOR ANXIETY 30 tablet 5  . gabapentin (NEURONTIN) 300 MG capsule Take 300 mg by mouth 2 (two) times daily as needed (for pain.).     Marland Kitchen oxyCODONE-acetaminophen (PERCOCET) 10-325 MG tablet Take 1 tablet by mouth See admin instructions. Take 1 tablet orally every 4 to 6 hours as needed for pain.  0  . PARoxetine (PAXIL) 20 MG tablet Take 20 mg by mouth daily.     Marland Kitchen zolpidem (AMBIEN) 10 MG tablet TAKE 1 TABLET BY MOUTH EVERY NIGHT AT BEDTIME 30 tablet 5  . losartan (COZAAR) 100 MG tablet Take 1 tablet (100 mg total) by mouth daily. 90 tablet 3  . pravastatin (PRAVACHOL) 40 MG tablet Take 1 tablet (40 mg total) by mouth every evening. 90 tablet 1     . aspirin EC  325 mg Oral Daily  . losartan  100 mg Oral Daily  . metoprolol tartrate  12.5 mg Oral BID  . PARoxetine  20 mg Oral Daily  . pravastatin  40 mg Oral QPM  . sodium chloride  3 mL Intravenous  Q12H  . zolpidem  5 mg Oral QHS    Infusions: . heparin 500 Units/hr (10/31/14 1404)    No Known Allergies  Social History   Social History  . Marital Status: Single    Spouse Name: N/A  . Number of Children: N/A  . Years of Education: N/A   Occupational History  . Not on file.   Social History Main Topics  . Smoking status: Current Every Day Smoker -- 0.50 packs/day  . Smokeless tobacco: Never Used  . Alcohol Use: No  . Drug Use: Not on file  . Sexual Activity: Not on file   Other Topics Concern  . Not on file   Social History Narrative   Divorced with 4 children    Current Smoker 1ppd    No alcohol   No illicit drug use      Occupation: Tax preparation        Family History  Problem Relation Age of Onset  . Alcohol abuse Father     deceased age 41  . Emphysema Mother     deceased age 109  . ADD / ADHD Son     PHYSICAL EXAM: Filed Vitals:   10/31/14 1622  BP: 149/81  Pulse: 87  Temp:  98.1 F (36.7 C)  Resp: 18     Intake/Output Summary (Last 24 hours) at 10/31/14 1919 Last data filed at 10/31/14 1809  Gross per 24 hour  Intake      0 ml  Output    300 ml  Net   -300 ml    General:  Well appearing. No respiratory difficulty HEENT: normal Neck: supple. no JVD. Carotids 2+ bilat; no bruits. No lymphadenopathy or thryomegaly appreciated. Cor: PMI nondisplaced. Regular rate & rhythm. No rubs, gallops or murmurs. Lungs: clear Abdomen: soft, nontender, nondistended. No hepatosplenomegaly. No bruits or masses. Good bowel sounds. Extremities: no cyanosis, clubbing, rash, edema Neuro: alert & oriented x 3, cranial nerves grossly intact. moves all 4 extremities w/o difficulty. Affect pleasant.  ECG: Sinus rhythm 110 bpm with ischemic anterolateral changes on EKG  Results for orders placed or performed during the hospital encounter of 10/31/14 (from the past 24 hour(s))  Comprehensive metabolic panel     Status: Abnormal   Collection Time: 10/31/14  12:05 PM  Result Value Ref Range   Sodium 132 (L) 135 - 145 mmol/L   Potassium 3.7 3.5 - 5.1 mmol/L   Chloride 100 (L) 101 - 111 mmol/L   CO2 20 (L) 22 - 32 mmol/L   Glucose, Bld 116 (H) 65 - 99 mg/dL   BUN 16 6 - 20 mg/dL   Creatinine, Ser 0.69 0.44 - 1.00 mg/dL   Calcium 9.2 8.9 - 10.3 mg/dL   Total Protein 7.0 6.5 - 8.1 g/dL   Albumin 3.9 3.5 - 5.0 g/dL   AST 16 15 - 41 U/L   ALT 10 (L) 14 - 54 U/L   Alkaline Phosphatase 92 38 - 126 U/L   Total Bilirubin 1.3 (H) 0.3 - 1.2 mg/dL   GFR calc non Af Amer >60 >60 mL/min   GFR calc Af Amer >60 >60 mL/min   Anion gap 12 5 - 15  CBC     Status: Abnormal   Collection Time: 10/31/14 12:05 PM  Result Value Ref Range   WBC 16.0 (H) 3.6 - 11.0 K/uL   RBC 5.57 (H) 3.80 - 5.20 MIL/uL   Hemoglobin 15.8 12.0 - 16.0 g/dL   HCT 47.3 (H) 35.0 - 47.0 %   MCV 84.9 80.0 - 100.0 fL   MCH 28.4 26.0 - 34.0 pg   MCHC 33.4 32.0 - 36.0 g/dL   RDW 15.1 (H) 11.5 - 14.5 %   Platelets 283 150 - 440 K/uL  Troponin I     Status: Abnormal   Collection Time: 10/31/14 12:05 PM  Result Value Ref Range   Troponin I 0.25 (H) <0.031 ng/mL  Magnesium     Status: Abnormal   Collection Time: 10/31/14 12:05 PM  Result Value Ref Range   Magnesium 1.4 (L) 1.7 - 2.4 mg/dL  Lipase, blood     Status: None   Collection Time: 10/31/14 12:05 PM  Result Value Ref Range   Lipase 23 11 - 51 U/L  Protime-INR     Status: None   Collection Time: 10/31/14 12:05 PM  Result Value Ref Range   Prothrombin Time 13.6 11.4 - 15.0 seconds   INR 1.02   APTT     Status: None   Collection Time: 10/31/14 12:05 PM  Result Value Ref Range   aPTT 29 24 - 36 seconds  Urinalysis complete, with microscopic ([facility] only)     Status: Abnormal   Collection Time: 10/31/14 12:06 PM  Result Value  Ref Range   Color, Urine YELLOW (A) YELLOW   APPearance CLEAR (A) CLEAR   Glucose, UA 50 (A) NEGATIVE mg/dL   Bilirubin Urine NEGATIVE NEGATIVE   Ketones, ur TRACE (A) NEGATIVE mg/dL   Specific  Gravity, Urine 1.009 1.005 - 1.030   Hgb urine dipstick 1+ (A) NEGATIVE   pH 6.0 5.0 - 8.0   Protein, ur 100 (A) NEGATIVE mg/dL   Nitrite NEGATIVE NEGATIVE   Leukocytes, UA TRACE (A) NEGATIVE   RBC / HPF 0-5 0 - 5 RBC/hpf   WBC, UA 0-5 0 - 5 WBC/hpf   Bacteria, UA RARE (A) NONE SEEN   Squamous Epithelial / LPF 0-5 (A) NONE SEEN   Mucous PRESENT   Hemoglobin A1c     Status: None   Collection Time: 10/31/14  4:26 PM  Result Value Ref Range   Hgb A1c MFr Bld 5.7 4.0 - 6.0 %  Troponin I     Status: Abnormal   Collection Time: 10/31/14  4:26 PM  Result Value Ref Range   Troponin I 0.25 (H) <0.031 ng/mL   Dg Abd Acute W/chest  10/31/2014  CLINICAL DATA:  Mid abd pain on/off for over 3 weeks, increased HR when standing. Chllls,weakness, NANDV, cough EXAM: DG ABDOMEN ACUTE W/ 1V CHEST COMPARISON:  01/06/2011 FINDINGS: Heart size and mediastinal contours are within normal limits. Lungs are clear. No effusion.  Cervical fixation hardware noted. No free air. Normal bowel gas pattern. Bilateral pelvic phleboliths and tubal ligation clips. Moderate aortoiliac arterial calcifications. Regional bones unremarkable. IMPRESSION: 1. No acute cardiopulmonary disease. 2. Normal bowel gas pattern. 3. Extensive aortoiliac arterial plaque. Electronically Signed   By: Lucrezia Europe M.D.   On: 10/31/2014 13:28     ASSESSMENT AND PLAN: Non-STEMI with abnormal EKG and elevated troponin and atypical presentation with nausea vomiting and palpitation. No chest pain, with history of premature coronary artery disease of both parents heavy smoking and other risk factors for coronary artery disease will proceed with cardiac catheterization tomorrow.  [name] [name] A

## 2014-10-31 NOTE — ED Provider Notes (Signed)
[facility] Emergency Department Provider Note  ____________________________________________  Time seen: Approximately 12:37 PM  I have reviewed the triage vital signs and the nursing notes.   HISTORY  Chief Complaint Emesis    HPI [name] [name] is a 54 y.o. female with a past medical history that includes chronic back pain, tobacco abuse, peripheral vascular disease, hypertension but not currently taking medications, and hyperlipidemia who presents with acute worsening of symptoms she has had for several weeks.  She states that she has intermittently had nausea and vomiting for the last 4 weeks but she has not sought treatment from her outpatient doctor.She states that since yesterday her symptoms of dizziness significantly worse.  She has had persistent vomiting, generalized weakness, and no chest pain but feelings of "pounding" heartbeat and some shortness of breath, particularly when she gets up to move around or otherwise exerting herself.  Overall the symptoms are described as severe.  Exertion makes it worse and nothing makes it better.  She has had decreased appetite.  She denies abdominal pain, dysuria, or any focal deficits.  She states that she has no history of heart problems and has no knowledge of her sugar relatives that have any cardiac issues..      Past Medical History  Diagnosis Date  . Hyperlipidemia   . Hypertension   . Atypical chest pain   . Depression   . Tobacco abuse   . Chronic insomnia   . PVD (peripheral vascular disease) ([place])     segmental femeropoliteal disease    Patient Active Problem List   Diagnosis Date Noted  . Atherosclerosis of abdominal aorta ([place]) 07/16/2014  . Adjustment disorder with mixed anxiety and depressed mood 07/16/2014  . Hypokalemia 11/19/2012  . Cervical postlaminectomy syndrome 01/10/2012  . Hot flashes 11/02/2011  . Chronic fatigue 11/02/2011  . Low back pain 08/17/2011  . Weight loss  01/12/2011  . 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 Hip pain 01/18/2010  . LOW BACK PAIN SYNDROME, SEVERE 10/05/2009  . ALLERGIC RHINITIS 07/01/2009  . LEUKOCYTOSIS 03/17/2009  . STRESS INCONTINENCE 03/16/2008  . CLAUDICATION 07/10/2007  . LEG PAIN, BILATERAL 06/24/2007  . IRREGULAR MENSES 04/04/2007  . Hyperlipidemia 01/01/2007  . ANEMIA 01/01/2007  . TOBACCO ABUSE 01/01/2007  . INSOMNIA, CHRONIC 01/01/2007  . DEPRESSION 01/01/2007  . RESTLESS LEG SYNDROME 01/01/2007  . Essential hypertension 01/01/2007  . PARASOMNIA 01/01/2007    Past Surgical History  Procedure Laterality Date  . Wrist surgery  2005    right wrist  . Cesarean section  1990  . Nasal sinus surgery  1999  . Bunionectomy  1996    bilateral  . Cervical discectomy  06/21/2006     and fusion   . Endometrial ablation  June 2010  . Tubal ligation      Current Outpatient Rx  Name  Route  Sig  Dispense  Refill  . amLODipine (NORVASC) 5 MG tablet   Oral   Take 5 mg by mouth every evening.         . clonazePAM (KLONOPIN) 1 MG tablet      TAKE 1/2 TABLET BY MOUTH ONCE TO TWICE DAILY AS NEEDED FOR ANXIETY   30 tablet   5   . gabapentin (NEURONTIN) 300 MG capsule      TK 1 C PO BID PRN      1   . losartan (COZAAR) 100 MG tablet   Oral   Take 1 tablet (100 mg total) by mouth  daily.   90 tablet   3   . oxyCODONE (ROXICODONE) 15 MG immediate release tablet   Oral   Take 7.5 mg by mouth every 2 (two) hours as needed for pain.         Marland Kitchen PARoxetine (PAXIL) 20 MG tablet   Oral   Take 20 mg by mouth daily.          . pravastatin (PRAVACHOL) 40 MG tablet   Oral   Take 1 tablet (40 mg total) by mouth every evening.   90 tablet   1   . zolpidem (AMBIEN) 10 MG tablet      TAKE 1 TABLET BY MOUTH EVERY NIGHT AT BEDTIME   30 tablet   5     Allergies Review of patient's allergies indicates no known allergies.  Family History  Problem Relation Age of Onset  . Alcohol abuse Father     deceased age  46  . Emphysema Mother     deceased age 14  . ADD / ADHD Son     Social History Social History  Substance Use Topics  . Smoking status: Current Every Day Smoker -- 0.50 packs/day  . Smokeless tobacco: Never Used  . Alcohol Use: No    Review of Systems Constitutional: Chills, no specific fever Eyes: No visual changes. ENT: No sore throat. Cardiovascular: Denies chest pain.  Has been feeling palpitations. Respiratory: Denies shortness of breath. Gastrointestinal: No abdominal pain.  Frequent and worsening nausea/vomiting.  No diarrhea.  No constipation.  Significantly decreased appetite. Genitourinary: Negative for dysuria. Musculoskeletal: Negative for back pain. Skin: Negative for rash. Neurological: Negative for headaches, focal weakness or numbness.  10-point ROS otherwise negative.  ____________________________________________   PHYSICAL EXAM:  VITAL SIGNS: ED Triage Vitals  Enc Vitals Group     BP 10/31/14 1155 177/80 mmHg     Pulse Rate 10/31/14 1155 103     Resp 10/31/14 1155 16     Temp 10/31/14 1155 98.4 F (36.9 C)     Temp Source 10/31/14 1155 Oral     SpO2 10/31/14 1155 99 %     Weight 10/31/14 1155 89 lb 4.6 oz (40.5 kg)     Height 10/31/14 1155 4\' 11"  (1.499 m)     Head Cir --      Peak Flow --      Pain Score --      Pain Loc --      Pain Edu? --      Excl. in [place]? --     Constitutional: Alert and oriented.  Cachectic.  No acute distress. Eyes: Conjunctivae are normal. PERRL. EOMI. Head: Atraumatic. Nose: No congestion/rhinnorhea. Mouth/Throat: Mucous membranes are dry.  Oropharynx non-erythematous. Neck: No stridor.   Cardiovascular: Tachycardia, regular rhythm. Grossly normal heart sounds.  Good peripheral circulation. Respiratory: Normal respiratory effort.  No retractions. Lungs CTAB. Gastrointestinal: Soft and nontender. No distention. No abdominal bruits. No CVA tenderness. Musculoskeletal: No lower extremity tenderness nor edema.  No  joint effusions. Neurologic:  Normal speech and language. No gross focal neurologic deficits are appreciated.  Skin:  Skin is warm, dry and intact. No rash noted. Psychiatric: Mood and affect are normal. Speech and behavior are normal.  ____________________________________________   LABS (all labs ordered are listed, but only abnormal results are displayed)  Labs Reviewed  COMPREHENSIVE METABOLIC PANEL - Abnormal; Notable for the following:    Sodium 132 (*)    Chloride 100 (*)    CO2 20 (*)  Glucose, Bld 116 (*)    ALT 10 (*)    Total Bilirubin 1.3 (*)    All other components within normal limits  CBC - Abnormal; Notable for the following:    WBC 16.0 (*)    RBC 5.57 (*)    HCT 47.3 (*)    RDW 15.1 (*)    All other components within normal limits  URINALYSIS COMPLETEWITH MICROSCOPIC ([facility] ONLY) - Abnormal; Notable for the following:    Color, Urine YELLOW (*)    APPearance CLEAR (*)    Glucose, UA 50 (*)    Ketones, ur TRACE (*)    Hgb urine dipstick 1+ (*)    Protein, ur 100 (*)    Leukocytes, UA TRACE (*)    Bacteria, UA RARE (*)    Squamous Epithelial / LPF 0-5 (*)    All other components within normal limits  TROPONIN I - Abnormal; Notable for the following:    Troponin I 0.25 (*)    All other components within normal limits  MAGNESIUM - Abnormal; Notable for the following:    Magnesium 1.4 (*)    All other components within normal limits  LIPASE, BLOOD  PROTIME-INR  APTT   ____________________________________________  EKG  ED ECG REPORT I, Emnet Monk, the attending physician, personally viewed and interpreted this ECG.   Date: 10/31/2014  EKG Time: 12:30  Rate: 101  Rhythm: sinus tachycardia  Axis: Normal  Intervals:Normal  ST&T Change: Significant ST depression is present in leads V4 through V6 with T-wave inversions in V3 through V5.  She also has ST depression in leads 2-3 and aVF.  She does not meet criteria for STEMI but her EKG is  significantly changed from the last prior EKG in 2012 and is concerning for acute ischemia.  ____________________________________________  RADIOLOGY   Dg Abd Acute W/chest  10/31/2014  CLINICAL DATA:  Mid abd pain on/off for over 3 weeks, increased HR when standing. Chllls,weakness, NANDV, cough EXAM: DG ABDOMEN ACUTE W/ 1V CHEST COMPARISON:  01/06/2011 FINDINGS: Heart size and mediastinal contours are within normal limits. Lungs are clear. No effusion.  Cervical fixation hardware noted. No free air. Normal bowel gas pattern. Bilateral pelvic phleboliths and tubal ligation clips. Moderate aortoiliac arterial calcifications. Regional bones unremarkable. IMPRESSION: 1. No acute cardiopulmonary disease. 2. Normal bowel gas pattern. 3. Extensive aortoiliac arterial plaque. Electronically Signed   By: Lucrezia Europe M.D.   On: 10/31/2014 13:28    ____________________________________________   PROCEDURES  Procedure(s) performed: None  Critical Care performed: Yes, see critical care note(s)   CRITICAL CARE Performed by: Hinda Kehr   Total critical care time: 30 minutes  Critical care time was exclusive of separately billable procedures and treating other patients.  Critical care was necessary to treat or prevent imminent or life-threatening deterioration.  Critical care was time spent personally by me on the following activities: development of treatment plan with patient and/or surrogate as well as nursing, discussions with consultants, evaluation of patient's response to treatment, examination of patient, obtaining history from patient or surrogate, ordering and performing treatments and interventions, ordering and review of laboratory studies, ordering and review of radiographic studies, pulse oximetry and re-evaluation of patient's condition.  ____________________________________________   INITIAL IMPRESSION / ASSESSMENT AND PLAN / ED COURSE  Pertinent labs & imaging results that  were available during my care of the patient were reviewed by me and considered in my medical decision making (see chart for details).  I am concerned at  the profound EKG changes seen today.  Given her risk factors of tobacco abuse, untreated hypertension, lipidemia, and known peripheral vascular disease, I suspect that her persistent nausea and vomiting and generalized weakness that has become acutely worse recently may reflect acute coronary syndrome.  She is in no acute distress at this time as long she does not exert herself.  Incidentally she also meet SIRS criteria based on her tachycardia and leukocytosis of 16, although I do not get the impression of a specific infectious cause at this time.  I do not believe she would benefit from early antibiotics without a known or specific source.  She has no abdominal tenderness to palpation.  ----------------------------------------- 1:37 PM on 10/31/2014 -----------------------------------------  Troponin is positive, this further solidifies my thought that her symptoms are an atypical presentation of ACS.  She still does not meet criteria for emergent catheterization.  I will start her on heparin with bolus and infusion, provided aspirin 324, admit for further management.  Updated patient and family. ____________________________________________  FINAL CLINICAL IMPRESSION(S) / ED DIAGNOSES  Final diagnoses:  NSTEMI (non-ST elevated myocardial infarction) ([place])  Non-intractable vomiting with nausea, vomiting of unspecified type      NEW MEDICATIONS STARTED DURING THIS VISIT:  New Prescriptions   No medications on file     Hinda Kehr, MD 10/31/14 1338

## 2014-10-31 NOTE — Progress Notes (Addendum)
Pt. admitted to 2A, rm 233. Oriented to room, call bell, Ascom phones and staff. Bed in low position. Fall safety plan reviewed, contract signed and placed on wall, brown non-skid socks in place, bed alarm on, pt mod+ fall risk. Full assessment to Epic. Will continue to monitor. Tele box [id number] verified with clerk, NSR with inverted T waves. Skin assessed with Georga Hacking, RN.    RN paged MD to ask if abx needed d/t elevated WBCs and rare bacteria in UA. MD acknowledged and attributes this to stress and says we will continue to monitor.

## 2014-10-31 NOTE — ED Notes (Signed)
Dr. Karma Greaser informed of critical troponin level of 0.25

## 2014-10-31 NOTE — Progress Notes (Addendum)
ANTICOAGULATION CONSULT NOTE - Initial Consult  Pharmacy Consult for Heparin Indication: chest pain/ACS  No Known Allergies  Patient Measurements: Height: 4\' 11"  (149.9 cm) Weight: 89 lb 4.6 oz (40.5 kg) IBW/kg (Calculated) : 43.2 Heparin Dosing Weight: 40.5 kg   Vital Signs: Temp: 98.4 F (36.9 C) (10/30 1155) Temp Source: Oral (10/30 1155) BP: 177/80 mmHg (10/30 1155) Pulse Rate: 103 (10/30 1155)  Labs:  Recent Labs  10/31/14 1205  HGB 15.8  HCT 47.3*  PLT 283  APTT 29  LABPROT 13.6  INR 1.02  CREATININE 0.69  TROPONINI 0.25*    Estimated Creatinine Clearance: 50.2 mL/min (by C-G formula based on Cr of 0.69).   Medical History: Past Medical History  Diagnosis Date  . Hyperlipidemia   . Hypertension   . Atypical chest pain   . Depression   . Tobacco abuse   . Chronic insomnia   . PVD (peripheral vascular disease) ([place])     segmental femeropoliteal disease    Medications:   (Not in a hospital admission)  Assessment: Pharmacy consulted to dose heparin in this 54 year old female admitted with NSTEMI.  No prior anticoag noted.  Goal of Therapy:  Heparin level 0.3-0.7 units/ml Monitor platelets by anticoagulation protocol: Yes   Plan:  Give 2400 units bolus x 1 Start heparin infusion at 500 units/hr  Will draw 1st HL 6 hrs after start of drip on 10/30 @ 20:00.  Will check platelets and H&H daily.   10/30:  HL @ 20:00 = < 0.1  Will order Heparin 1200 units bolus X 1 and increase drip rate to 650 units/hr. Will recheck HL 6 hrs after rate change on 10/31 @ 3:30.   Rachel Rison D 10/31/2014,1:46 PM

## 2014-10-31 NOTE — H&P (Signed)
[facility] [place] NAME: [name] [name]    MR#:  [id number]  DATE OF BIRTH:  1960/09/21  DATE OF ADMISSION:  10/31/2014  PRIMARY CARE PHYSICIAN: Drema Pry, DO   REQUESTING/REFERRING PHYSICIAN: Hinda Kehr  CHIEF COMPLAINT:   Chief Complaint  Patient presents with  . Emesis    HISTORY OF PRESENT ILLNESS: [name] [name]  is a 54 y.o. female with a known history of hyperlipidemia, hypertension, depression, smoking- has on and off nausea for last 2 weeks. Today morning she started feeling excessively nauseated and dizzy she also had severe palpitation today morning so she talked to one of her friend and she called ambulance for her. When EMS arrived they noted her blood pressure was very high and she had tachycardia so they brought her to emergency room. She was noted to have EKG changes of T-wave inversion and some ST depression in some chest leads, and troponin was 0.2 so she is given his admission to hospitalist team. ER started her on heparin IV drip.  She denies any diarrhea or abdominal pain or fever. She said, she is losing weight almost 10 pounds in last 2 months which is unintentional.  PAST MEDICAL HISTORY:   Past Medical History  Diagnosis Date  . Hyperlipidemia   . Hypertension   . Atypical chest pain   . Depression   . Tobacco abuse   . Chronic insomnia   . PVD (peripheral vascular disease) ([place])     segmental femeropoliteal disease    PAST SURGICAL HISTORY:  Past Surgical History  Procedure Laterality Date  . Wrist surgery  2005    right wrist  . Cesarean section  1990  . Nasal sinus surgery  1999  . Bunionectomy  1996    bilateral  . Cervical discectomy  06/21/2006     and fusion   . Endometrial ablation  June 2010  . Tubal ligation      SOCIAL HISTORY:  Social History  Substance Use Topics  . Smoking status: Current Every Day Smoker -- 0.50 packs/day  . Smokeless tobacco: Never Used  . Alcohol Use: No     FAMILY HISTORY:  Family History  Problem Relation Age of Onset  . Alcohol abuse Father     deceased age 29  . Emphysema Mother     deceased age 110  . ADD / ADHD Son     DRUG ALLERGIES: No Known Allergies  REVIEW OF SYSTEMS:   CONSTITUTIONAL: No fever, fatigue or weakness.  EYES: No blurred or double vision.  EARS, NOSE, AND THROAT: No tinnitus or ear pain.  RESPIRATORY: No cough, shortness of breath, wheezing or hemoptysis.  CARDIOVASCULAR: No chest pain, orthopnea, edema. Had palpitation GASTROINTESTINAL: positive for nausea, vomiting, no diarrhea or abdominal pain.  GENITOURINARY: No dysuria, hematuria.  ENDOCRINE: No polyuria, nocturia,  HEMATOLOGY: No anemia, easy bruising or bleeding SKIN: No rash or lesion. MUSCULOSKELETAL: No joint pain or arthritis.   NEUROLOGIC: No tingling, numbness, weakness.  PSYCHIATRY: No anxiety or depression.   MEDICATIONS AT HOME:  Prior to Admission medications   Medication Sig Start Date End Date Taking? Authorizing Provider  clonazePAM (KLONOPIN) 1 MG tablet TAKE 1/2 TABLET BY MOUTH ONCE TO TWICE DAILY AS NEEDED FOR ANXIETY 10/06/14  Yes Doe-Hyun Kyra Searles, DO  gabapentin (NEURONTIN) 300 MG capsule Take 300 mg by mouth 2 (two) times daily as needed (for pain.).    Yes Historical Provider, MD  oxyCODONE-acetaminophen (PERCOCET) [phone]  MG tablet Take 1 tablet by mouth See admin instructions. Take 1 tablet orally every 4 to 6 hours as needed for pain. 10/21/14  Yes Historical Provider, MD  PARoxetine (PAXIL) 20 MG tablet Take 20 mg by mouth daily.  07/04/14  Yes Historical Provider, MD  zolpidem (AMBIEN) 10 MG tablet TAKE 1 TABLET BY MOUTH EVERY NIGHT AT BEDTIME 10/06/14  Yes Doe-Hyun R Yoo, DO  losartan (COZAAR) 100 MG tablet Take 1 tablet (100 mg total) by mouth daily. 09/25/13   Zenaida Niece, PA-C  pravastatin (PRAVACHOL) 40 MG tablet Take 1 tablet (40 mg total) by mouth every evening. 07/16/14   Doe-Hyun R Shawna Orleans, DO      PHYSICAL  EXAMINATION:   VITAL SIGNS: Blood pressure 181/86, pulse 104, temperature 98.4 F (36.9 C), temperature source Oral, resp. rate 26, height 4\' 11"  (1.499 m), weight 40.5 kg (89 lb 4.6 oz), SpO2 98 %.  GENERAL:  54 y.o.-year-old thin patient lying in the bed with no acute distress.  EYES: Pupils equal, round, reactive to light and accommodation. No scleral icterus. Extraocular muscles intact.  HEENT: Head atraumatic, normocephalic. Oropharynx and nasopharynx clear.  NECK:  Supple, no jugular venous distention. No thyroid enlargement, no tenderness.  LUNGS: Normal breath sounds bilaterally, no wheezing, rales,rhonchi or crepitation. No use of accessory muscles of respiration.  CARDIOVASCULAR: S1, S2 normal. No murmurs, rubs, or gallops.  ABDOMEN: Soft, nontender, nondistended. Bowel sounds present. No organomegaly or mass.  EXTREMITIES: No pedal edema, cyanosis, or clubbing.  NEUROLOGIC: Cranial nerves II through XII are intact. Muscle strength 5/5 in all extremities. Sensation intact. Gait not checked.  PSYCHIATRIC: The patient is alert and oriented x 3.  SKIN: No obvious rash, lesion, or ulcer.   LABORATORY PANEL:   CBC  Recent Labs Lab 10/31/14 1205  WBC 16.0*  HGB 15.8  HCT 47.3*  PLT 283  MCV 84.9  MCH 28.4  MCHC 33.4  RDW 15.1*   ------------------------------------------------------------------------------------------------------------------  Chemistries   Recent Labs Lab 10/31/14 1205  NA 132*  K 3.7  CL 100*  CO2 20*  GLUCOSE 116*  BUN 16  CREATININE 0.69  CALCIUM 9.2  MG 1.4*  AST 16  ALT 10*  ALKPHOS 92  BILITOT 1.3*   ------------------------------------------------------------------------------------------------------------------ estimated creatinine clearance is 50.2 mL/min (by C-G formula based on Cr of 0.69). ------------------------------------------------------------------------------------------------------------------ No results for input(s):  TSH, T4TOTAL, T3FREE, THYROIDAB in the last 72 hours.  Invalid input(s): FREET3   Coagulation profile  Recent Labs Lab 10/31/14 1205  INR 1.02   ------------------------------------------------------------------------------------------------------------------- No results for input(s): DDIMER in the last 72 hours. -------------------------------------------------------------------------------------------------------------------  Cardiac Enzymes  Recent Labs Lab 10/31/14 1205  TROPONINI 0.25*   ------------------------------------------------------------------------------------------------------------------ Invalid input(s): POCBNP  ---------------------------------------------------------------------------------------------------------------  Urinalysis    Component Value Date/Time   COLORURINE YELLOW* 10/31/2014 [place]* 10/31/2014 1206   LABSPEC 1.009 10/31/2014 1206   PHURINE 6.0 10/31/2014 1206   GLUCOSEU 50* 10/31/2014 1206   GLUCOSEU NEGATIVE 01/01/2007 0932   HGBUR 1+* 10/31/2014 1206   BILIRUBINUR NEGATIVE 10/31/2014 1206   KETONESUR TRACE* 10/31/2014 1206   PROTEINUR 100* 10/31/2014 1206   UROBILINOGEN 0.2 07/29/2013 1355   NITRITE NEGATIVE 10/31/2014 1206   LEUKOCYTESUR TRACE* 10/31/2014 1206     RADIOLOGY: Dg Abd Acute W/chest  10/31/2014  CLINICAL DATA:  Mid abd pain on/off for over 3 weeks, increased HR when standing. Chllls,weakness, NANDV, cough EXAM: DG ABDOMEN ACUTE W/ 1V CHEST COMPARISON:  01/06/2011 FINDINGS: Heart size and mediastinal contours  are within normal limits. Lungs are clear. No effusion.  Cervical fixation hardware noted. No free air. Normal bowel gas pattern. Bilateral pelvic phleboliths and tubal ligation clips. Moderate aortoiliac arterial calcifications. Regional bones unremarkable. IMPRESSION: 1. No acute cardiopulmonary disease. 2. Normal bowel gas pattern. 3. Extensive aortoiliac arterial plaque. Electronically  Signed   By: Lucrezia Europe M.D.   On: 10/31/2014 13:28    EKG: Sinus tachycardia with T-wave inversion on chest leads.  IMPRESSION AND PLAN:  * Non-ST elevation MI  Because of her elevated troponin and EKG changes this might be an atypical presentation of her MI.  I agree with heparin IV drip has ER started.  Admit to telemetry, follow serial troponin, get echocardiogram.  Get cardiology consult.  Give aspirin for now.  * Uncontrolled hypertension  Continue losartan as she takes at home.  Metoprolol.  * Hyperlipidemia  Continue pravastatin.  * Depression  Continue paroxetine  * Smoking  Counseled for 4 minutes to quit smoking, offered nicotine patch.   All the records are reviewed and case discussed with ED provider. Management plans discussed with the patient, family and they are in agreement.  CODE STATUS: Full.   TOTAL TIME TAKING CARE OF THIS PATIENT: 50 minutes.    Vaughan Basta M.D on 10/31/2014   Between 7am to 6pm - Pager - [phone]  After 6pm go to [email or web address] - password [email or web address] [facility]  [facility]  Office  [phone]  CC: Primary care physician; Drema Pry, DO   Note: This dictation was prepared with Dragon dictation along with smaller phrase technology. Any transcriptional errors that result from this process are unintentional.

## 2014-10-31 NOTE — ED Notes (Signed)
Patient brought in by [facility], patient states that she has had nausea and vomiting for the past 4 weeks on and off. For the past weeks symptoms have been constant. When EMS got to patient she was lying in bed, when she stood up her heart rate increased 40 points. Patient has been given 1 Liter of fluids by EMS, BP 177/94, heart rate 107, o2 sats 100% CBG, 128. Patient was given Zofran 4mg  IV by EMS. Patient denies any acute pain

## 2014-11-01 ENCOUNTER — Encounter
Admission: EM | Disposition: A | Discharge status: Home / Self Care | Payer: Self-pay | Source: Home / Self Care | Attending: Internal Medicine

## 2014-11-01 ENCOUNTER — Encounter: Payer: Self-pay | Admitting: Cardiovascular Disease

## 2014-11-01 ENCOUNTER — Inpatient Hospital Stay
Admit: 2014-11-01 | Discharge: 2014-11-01 | Disposition: A | Discharge status: Home / Self Care | Payer: BLUE CROSS/BLUE SHIELD | Attending: Internal Medicine | Admitting: Internal Medicine

## 2014-11-01 DIAGNOSIS — E871 Hypo-osmolality and hyponatremia: Secondary | ICD-10-CM

## 2014-11-01 DIAGNOSIS — E860 Dehydration: Secondary | ICD-10-CM

## 2014-11-01 DIAGNOSIS — R778 Other specified abnormalities of plasma proteins: Secondary | ICD-10-CM

## 2014-11-01 DIAGNOSIS — I10 Essential (primary) hypertension: Secondary | ICD-10-CM

## 2014-11-01 DIAGNOSIS — R7989 Other specified abnormal findings of blood chemistry: Secondary | ICD-10-CM

## 2014-11-01 DIAGNOSIS — Z9889 Other specified postprocedural states: Secondary | ICD-10-CM

## 2014-11-01 HISTORY — PX: CARDIAC CATHETERIZATION: SHX172

## 2014-11-01 LAB — BASIC METABOLIC PANEL
Anion gap: 7 (ref 5–15)
BUN: 15 mg/dL (ref 6–20)
CHLORIDE: 103 mmol/L (ref 101–111)
CO2: 23 mmol/L (ref 22–32)
CREATININE: 0.59 mg/dL (ref 0.44–1.00)
Calcium: 8.7 mg/dL — ABNORMAL LOW (ref 8.9–10.3)
GFR calc non Af Amer: >60 mL/min (ref >60)
Glucose, Bld: 110 mg/dL — ABNORMAL HIGH (ref 65–99)
Potassium: 3.8 mmol/L (ref 3.5–5.1)
Sodium: 133 mmol/L — ABNORMAL LOW (ref 135–145)

## 2014-11-01 LAB — LIPID PANEL
Cholesterol: 156 mg/dL (ref 0–200)
HDL: 31 mg/dL — ABNORMAL LOW (ref >40)
LDL CALC: 100 mg/dL — AB (ref 0–99)
TRIGLYCERIDES: 127 mg/dL (ref <150)
Total CHOL/HDL Ratio: 5 RATIO
VLDL: 25 mg/dL (ref 0–40)

## 2014-11-01 LAB — CBC
HCT: 40 % (ref 35.0–47.0)
HEMOGLOBIN: 13.7 g/dL (ref 12.0–16.0)
MCH: 29.2 pg (ref 26.0–34.0)
MCHC: 34.3 g/dL (ref 32.0–36.0)
MCV: 85.4 fL (ref 80.0–100.0)
Platelets: 245 10*3/uL (ref 150–440)
RBC: 4.68 MIL/uL (ref 3.80–5.20)
RDW: 15 % — ABNORMAL HIGH (ref 11.5–14.5)
WBC: 13 10*3/uL — AB (ref 3.6–11.0)

## 2014-11-01 LAB — SODIUM: SODIUM: 134 mmol/L — AB (ref 135–145)

## 2014-11-01 LAB — TROPONIN I: Troponin I: 0.21 ng/mL — ABNORMAL HIGH (ref <0.031)

## 2014-11-01 LAB — HEPARIN LEVEL (UNFRACTIONATED): HEPARIN UNFRACTIONATED: 0.13 [IU]/mL — AB (ref 0.30–0.70)

## 2014-11-01 SURGERY — LEFT HEART CATH AND CORONARY ANGIOGRAPHY
Anesthesia: Moderate Sedation

## 2014-11-01 MED ORDER — ASPIRIN 81 MG PO CHEW
81.0000 mg | CHEWABLE_TABLET | ORAL | Status: AC
  Administered 2014-11-01: 81 mg via ORAL
  Filled 2014-11-01: qty 1

## 2014-11-01 MED ORDER — MIDAZOLAM HCL 2 MG/2ML IJ SOLN
INTRAMUSCULAR | Status: DC | PRN
  Administered 2014-11-01 (×3): 0.5 mg via INTRAVENOUS

## 2014-11-01 MED ORDER — HYDROMORPHONE HCL 1 MG/ML IJ SOLN
INTRAMUSCULAR | Status: AC
  Filled 2014-11-01: qty 1

## 2014-11-01 MED ORDER — IOHEXOL 300 MG/ML  SOLN
INTRAMUSCULAR | Status: DC | PRN
Start: 2014-11-01 — End: 2014-11-01
  Administered 2014-11-01: 90 mL via INTRA_ARTERIAL

## 2014-11-01 MED ORDER — ONDANSETRON HCL 4 MG/2ML IJ SOLN: 4.0000 mg | Freq: Four times a day (QID) | INTRAMUSCULAR | Status: DC | PRN

## 2014-11-01 MED ORDER — SODIUM CHLORIDE 0.9 % IJ SOLN
3.0000 mL | Freq: Two times a day (BID) | INTRAMUSCULAR | Status: DC
  Administered 2014-11-01: 3 mL via INTRAVENOUS

## 2014-11-01 MED ORDER — METOPROLOL TARTRATE 25 MG PO TABS
25.0000 mg | ORAL_TABLET | Freq: Two times a day (BID) | ORAL | Status: DC
  Administered 2014-11-01: 25 mg via ORAL
  Filled 2014-11-01: qty 1

## 2014-11-01 MED ORDER — HEPARIN BOLUS VIA INFUSION
1100.0000 [IU] | Freq: Once | INTRAVENOUS | Status: AC
  Administered 2014-11-01: 1100 [IU] via INTRAVENOUS
  Filled 2014-11-01: qty 1100

## 2014-11-01 MED ORDER — ACETAMINOPHEN 325 MG PO TABS: 650.0000 mg | ORAL_TABLET | ORAL | Status: DC | PRN

## 2014-11-01 MED ORDER — AMLODIPINE BESYLATE 5 MG PO TABS
5.0000 mg | ORAL_TABLET | Freq: Every day | ORAL | Status: DC
  Administered 2014-11-01: 5 mg via ORAL
  Filled 2014-11-01: qty 1

## 2014-11-01 MED ORDER — SODIUM CHLORIDE 0.9 % IJ SOLN: 3.0000 mL | INTRAMUSCULAR | Status: DC | PRN

## 2014-11-01 MED ORDER — HEPARIN (PORCINE) IN NACL 2-0.9 UNIT/ML-% IJ SOLN
INTRAMUSCULAR | Status: AC
  Filled 2014-11-01: qty 1000

## 2014-11-01 MED ORDER — MIDAZOLAM HCL 2 MG/2ML IJ SOLN
INTRAMUSCULAR | Status: AC
  Filled 2014-11-01: qty 2

## 2014-11-01 MED ORDER — METOPROLOL TARTRATE 25 MG PO TABS: 25.0000 mg | ORAL_TABLET | Freq: Two times a day (BID) | ORAL | Status: DC

## 2014-11-01 MED ORDER — SODIUM CHLORIDE 0.9 % WEIGHT BASED INFUSION: 1.0000 mL/kg/h | INTRAVENOUS | Status: AC

## 2014-11-01 MED ORDER — FENTANYL CITRATE (PF) 100 MCG/2ML IJ SOLN
INTRAMUSCULAR | Status: DC | PRN
  Administered 2014-11-01 (×3): 25 ug via INTRAVENOUS

## 2014-11-01 MED ORDER — AMLODIPINE BESYLATE 5 MG PO TABS: 5.0000 mg | ORAL_TABLET | Freq: Every day | ORAL | Status: DC

## 2014-11-01 MED ORDER — SODIUM CHLORIDE 0.9 % IV SOLN: 250.0000 mL | INTRAVENOUS | Status: DC | PRN

## 2014-11-01 MED ORDER — HYDROMORPHONE HCL 1 MG/ML IJ SOLN
0.5000 mg | Freq: Once | INTRAMUSCULAR | Status: AC
  Administered 2014-11-01: 0.5 mg via INTRAVENOUS

## 2014-11-01 MED ORDER — FENTANYL CITRATE (PF) 100 MCG/2ML IJ SOLN
INTRAMUSCULAR | Status: AC
Start: 2014-11-01 — End: 2014-11-01
  Filled 2014-11-01: qty 2

## 2014-11-01 SURGICAL SUPPLY — 9 items
CATH INFINITI 5FR ANG PIGTAIL (CATHETERS) ×2 IMPLANT
CATH INFINITI 5FR JL4 (CATHETERS) ×2 IMPLANT
CATH INFINITI JR4 5F (CATHETERS) ×2 IMPLANT
KIT MANI 3VAL PERCEP (MISCELLANEOUS) ×2 IMPLANT
NDL PERC 18GX7CM (NEEDLE) ×1 IMPLANT
NEEDLE PERC 18GX7CM (NEEDLE) ×2 IMPLANT
PACK CARDIAC CATH (CUSTOM PROCEDURE TRAY) ×2 IMPLANT
SHEATH PINNACLE 5F 10CM (SHEATH) ×2 IMPLANT
WIRE EMERALD 3MM-J .035X150CM (WIRE) ×2 IMPLANT

## 2014-11-01 NOTE — Progress Notes (Signed)
ANTICOAGULATION CONSULT NOTE - Initial Consult  Pharmacy Consult for Heparin Indication: chest pain/ACS  No Known Allergies  Patient Measurements: Height: 4\' 11"  (149.9 cm) Weight: 81 lb 9.6 oz (37.014 kg) IBW/kg (Calculated) : 43.2 Heparin Dosing Weight: 40.5 kg   Vital Signs: Temp: 97.9 F (36.6 C) (10/31 0424) Temp Source: Oral (10/31 0424) BP: 159/70 mmHg (10/31 0424) Pulse Rate: 85 (10/31 0424)  Labs:  Recent Labs  10/31/14 1205 10/31/14 1626 10/31/14 2002 11/01/14 0040 11/01/14 0043 11/01/14 0438  HGB 15.8  --   --   --   --  13.7  HCT 47.3*  --   --   --   --  40.0  PLT 283  --   --   --   --  245  APTT 29  --   --   --   --   --   LABPROT 13.6  --   --   --   --   --   INR 1.02  --   --   --   --   --   HEPARINUNFRC  --   --  <0.10*  --   --  0.13*  CREATININE 0.69  --   --   --  0.59  --   TROPONINI 0.25* 0.25* 0.24* 0.21*  --   --     Estimated Creatinine Clearance: 45.9 mL/min (by C-G formula based on Cr of 0.59).   Medical History: Past Medical History  Diagnosis Date  . Hyperlipidemia   . Hypertension   . Atypical chest pain   . Depression   . Tobacco abuse   . Chronic insomnia   . PVD (peripheral vascular disease) ([place])     segmental femeropoliteal disease  . Back pain     chronic    Medications:  Prescriptions prior to admission  Medication Sig Dispense Refill Last Dose  . clonazePAM (KLONOPIN) 1 MG tablet TAKE 1/2 TABLET BY MOUTH ONCE TO TWICE DAILY AS NEEDED FOR ANXIETY 30 tablet 5 10/29/2014 at Unknown time  . gabapentin (NEURONTIN) 300 MG capsule Take 300 mg by mouth 2 (two) times daily as needed (for pain.).    Past Month at Unknown time  . oxyCODONE-acetaminophen (PERCOCET) 10-325 MG tablet Take 1 tablet by mouth See admin instructions. Take 1 tablet orally every 4 to 6 hours as needed for pain.  0 10/29/2014  . PARoxetine (PAXIL) 20 MG tablet Take 20 mg by mouth daily.    10/30/2014 at Unknown time  . zolpidem (AMBIEN) 10 MG  tablet TAKE 1 TABLET BY MOUTH EVERY NIGHT AT BEDTIME 30 tablet 5 10/29/2014  . losartan (COZAAR) 100 MG tablet Take 1 tablet (100 mg total) by mouth daily. 90 tablet 3 Taking  . pravastatin (PRAVACHOL) 40 MG tablet Take 1 tablet (40 mg total) by mouth every evening. 90 tablet 1     Assessment: Pharmacy consulted to dose heparin in this 54 year old female admitted with NSTEMI.  No prior anticoag noted.  Goal of Therapy:  Heparin level 0.3-0.7 units/ml Monitor platelets by anticoagulation protocol: Yes   Plan:  Give 2400 units bolus x 1 Start heparin infusion at 500 units/hr  Will draw 1st HL 6 hrs after start of drip on 10/30 @ 20:00.  Will check platelets and H&H daily.   10/30:  HL @ 20:00 = < 0.1  Will order Heparin 1200 units bolus X 1 and increase drip rate to 650 units/hr. Will recheck HL 6 hrs  after rate change on 10/31 @ 3:30.   1031 0438 heparin level subtherapeutic. 1100 unit IV x 1 bolus then increase rate to 800 units/hr. Will recheck in 6 hours at 1200.  Laural Benes, Pharm.D. Clinical Pharmacist 11/01/2014,5:40 AM

## 2014-11-01 NOTE — Progress Notes (Signed)
Patient is being discharged today. After speaking with Dr. Ether Griffins, patient will stay here until this afternoon to monitor her access site and give more fluids. Will prepare discharge paperwork shortly. Patient is currently resting comfortably, still with some chronic back pain, but it is improving. Vascular access site is stable with no bleeding.

## 2014-11-01 NOTE — Discharge Summary (Signed)
[facility] at [place] NAME: [name] [name]    MR#:  [id number]  DATE OF BIRTH:  Mar 08, 1960  DATE OF ADMISSION:  10/31/2014 ADMITTING PHYSICIAN: Vaughan Basta, MD  DATE OF DISCHARGE: No discharge date for patient encounter.  PRIMARY CARE PHYSICIAN: Drema Pry, DO     ADMISSION DIAGNOSIS:  NSTEMI (non-ST elevated myocardial infarction) ([place]) [I21.4] Non-intractable vomiting with nausea, vomiting of unspecified type [R11.2]  DISCHARGE DIAGNOSIS:  Principal Problem:   Chest pain Active Problems:   S/P cardiac catheterization   Elevated troponin   Essential hypertension, malignant   Hyponatremia   Dehydration   SECONDARY DIAGNOSIS:   Past Medical History  Diagnosis Date  . Hyperlipidemia   . Hypertension   . Atypical chest pain   . Depression   . Tobacco abuse   . Chronic insomnia   . PVD (peripheral vascular disease) ([place])     segmental femeropoliteal disease  . Back pain     chronic    .pro HOSPITAL COURSE:   Patient is 54 year old Caucasian female with history of hypertension, hyperlipidemia, depression,  ongoing tobacco abuse who presents to the hospital with complaints of nausea and dizziness. Today, she noted also palpitations and presented to the hospital. On arrival to emergency room, her blood pressure was found to be very high and she was noted to have tachycardia, her EKG revealed T depressions. Troponin was noted to be minimal elevated at 0.21-0.25, she was admitted to the hospital for further evaluation due to concerns of acute coronary syndrome. She was seen by cardiologist who recommended cardiac catheterization, which was performed on 11/01/2014. 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 Elevated troponin, likely demand ischemia, possibly related to malignant essential hypertension, add Norvasc, metoprolol to her usual dose of losartan , follow as outpatient and advanced medications as needed 2. Malignant essential hypertension. As above, continue Norvasc, metoprolol as well as losartan, following blood pressure readings closely as outpatient 3.  Chronic hyponatremia, mild improvement with IV fluid administration, possibly SIADH, which needs to be worked up as outpatient, stable since 2008 4. Tobacco abuse. Discussed with patient , continue replacement therapy was recommended DISCHARGE CONDITIONS:   Stable  CONSULTS OBTAINED:  Treatment Team:  Dionisio David, MD  DRUG ALLERGIES:  No Known Allergies  DISCHARGE MEDICATIONS:   Current Discharge Medication List    START taking these medications   Details  amLODipine (NORVASC) 5 MG tablet Take 1 tablet (5 mg total) by mouth daily. Qty: 30 tablet, Refills: 6    metoprolol tartrate (LOPRESSOR) 25 MG tablet Take 1 tablet (25 mg total) by mouth 2 (two) times daily. Qty: 60 tablet, Refills: 6      CONTINUE these medications which have NOT CHANGED   Details  clonazePAM (KLONOPIN) 1 MG tablet TAKE 1/2 TABLET BY MOUTH ONCE TO TWICE DAILY AS NEEDED FOR ANXIETY Qty: 30 tablet, Refills: 5    gabapentin (NEURONTIN) 300 MG capsule Take 300 mg by mouth 2 (two) times daily as needed (for pain.).     oxyCODONE-acetaminophen (PERCOCET) 10-325 MG tablet Take 1 tablet by mouth See admin instructions. Take 1 tablet orally every 4 to 6 hours as needed for pain. Refills: 0    PARoxetine (PAXIL) 20 MG tablet Take 20 mg by  mouth daily.     zolpidem (AMBIEN) 10 MG tablet TAKE 1 TABLET BY MOUTH EVERY NIGHT AT BEDTIME Qty: 30 tablet, Refills: 5    losartan (COZAAR) 100 MG tablet Take 1 tablet (100 mg total) by mouth daily. Qty: 90 tablet, Refills: 3   Associated Diagnoses: Unspecified essential hypertension    pravastatin  (PRAVACHOL) 40 MG tablet Take 1 tablet (40 mg total) by mouth every evening. Qty: 90 tablet, Refills: 1         DISCHARGE INSTRUCTIONS:    Patient is to follow-up with primary care physician as outpatient  If you experience worsening of your admission symptoms, develop shortness of breath, life threatening emergency, suicidal or homicidal thoughts you must seek medical attention immediately by calling 911 or calling your MD immediately  if symptoms less severe.  You Must read complete instructions/literature along with all the possible adverse reactions/side effects for all the Medicines you take and that have been prescribed to you. Take any new Medicines after you have completely understood and accept all the possible adverse reactions/side effects.   Please note  You were cared for by a hospitalist during your hospital stay. If you have any questions about your discharge medications or the care you received while you were in the hospital after you are discharged, you can call the unit and asked to speak with the hospitalist on call if the hospitalist that took care of you is not available. Once you are discharged, your primary care physician will handle any further medical issues. Please note that NO REFILLS for any discharge medications will be authorized once you are discharged, as it is imperative that you return to your primary care physician (or establish a relationship with a primary care physician if you do not have one) for your aftercare needs so that they can reassess your need for medications and monitor your lab values.    Today   CHIEF COMPLAINT:   Chief Complaint  Patient presents with  . Emesis    HISTORY OF PRESENT ILLNESS:  [name] [name]  is a 55 y.o. female with a known history of hypertension, hyperlipidemia, depression,  ongoing tobacco abuse who presents to the hospital with complaints of nausea and dizziness. Today, she noted also palpitations and presented to  the hospital. On arrival to emergency room, her blood pressure was found to be very high and she was noted to have tachycardia, her EKG revealed T depressions. Troponin was noted to be minimal elevated at 0.21-0.25, she was admitted to the hospital for further evaluation due to concerns of acute coronary syndrome. She was seen by cardiologist who recommended cardiac catheterization, which was performed on 11/01/2014. 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 Elevated troponin, likely demand ischemia, possibly related to malignant essential hypertension, add Norvasc, metoprolol to her usual dose of losartan , follow as outpatient and advanced medications as needed 2. Malignant essential hypertension. As above, continue Norvasc, metoprolol as well as losartan, following blood pressure readings closely as outpatient 3.  Chronic hyponatremia, mild improvement with IV fluid administration, possibly SIADH, which needs to be worked up as outpatient, stable since 2008 4. Tobacco abuse. Discussed with patient , continue replacement therapy was recommended   VITAL SIGNS:  Blood pressure 154/63, pulse 78, temperature 98.4 F (36.9 C), temperature source  Oral, resp. rate 16, height 4\' 11"  (1.499 m), weight 37.24 kg (82 lb 1.6 oz), SpO2 96 %.  I/O:   Intake/Output Summary (Last 24 hours) at 11/01/14 1554 Last data filed at 11/01/14 1540  Gross per 24 hour  Intake      3 ml  Output    900 ml  Net   -897 ml    PHYSICAL EXAMINATION:  GENERAL:  54 y.o.-year-old patient lying in the bed with no acute distress.  EYES: Pupils equal, round, reactive to light and accommodation. No scleral icterus. Extraocular muscles intact.  HEENT: Head atraumatic, normocephalic. Oropharynx and nasopharynx  clear.  NECK:  Supple, no jugular venous distention. No thyroid enlargement, no tenderness.  LUNGS: Normal breath sounds bilaterally, no wheezing, rales,rhonchi or crepitation. No use of accessory muscles of respiration.  CARDIOVASCULAR: S1, S2 normal. No murmurs, rubs, or gallops.  ABDOMEN: Soft, non-tender, non-distended. Bowel sounds present. No organomegaly or mass.  EXTREMITIES: No pedal edema, cyanosis, or clubbing.  NEUROLOGIC: Cranial nerves II through XII are intact. Muscle strength 5/5 in all extremities. Sensation intact. Gait not checked.  PSYCHIATRIC: The patient is alert and oriented x 3.  SKIN: No obvious rash, lesion, or ulcer.   DATA REVIEW:   CBC  Recent Labs Lab 11/01/14 0438  WBC 13.0*  HGB 13.7  HCT 40.0  PLT 245    Chemistries   Recent Labs Lab 10/31/14 1205 11/01/14 0043 11/01/14 1456  NA 132* 133* 134*  K 3.7 3.8  --   CL 100* 103  --   CO2 20* 23  --   GLUCOSE 116* 110*  --   BUN 16 15  --   CREATININE 0.69 0.59  --   CALCIUM 9.2 8.7*  --   MG 1.4*  --   --   AST 16  --   --   ALT 10*  --   --   ALKPHOS 92  --   --   BILITOT 1.3*  --   --     Cardiac Enzymes  Recent Labs Lab 11/01/14 0040  TROPONINI 0.21*    Microbiology Results  Results for orders placed or performed in visit on 04/26/08  Smear     Status: None   Collection Time: 04/29/08  3:39 PM  Result Value Ref Range Status   Smear Result Smear Available  Final    RADIOLOGY:  Dg Abd Acute W/chest  10/31/2014  CLINICAL DATA:  Mid abd pain on/off for over 3 weeks, increased HR when standing. Chllls,weakness, NANDV, cough EXAM: DG ABDOMEN ACUTE W/ 1V CHEST COMPARISON:  01/06/2011 FINDINGS: Heart size and mediastinal contours are within normal limits. Lungs are clear. No effusion.  Cervical fixation hardware noted. No free air. Normal bowel gas pattern. Bilateral pelvic phleboliths and tubal ligation clips. Moderate aortoiliac arterial calcifications. Regional bones  unremarkable. IMPRESSION: 1. No acute cardiopulmonary disease. 2. Normal bowel gas pattern. 3. Extensive aortoiliac arterial plaque. Electronically Signed   By: Lucrezia Europe M.D.   On: 10/31/2014 13:28    EKG:   Orders placed or performed during the hospital encounter of 09/14/10  . EKG  . EKG  . EKG      Management plans discussed with the patient, family and they are in agreement.  CODE STATUS:     Code Status Orders        Start     Ordered   11/01/14 0916  Full code   Continuous     11/01/14 0916  TOTAL TIME TAKING CARE OF THIS PATIENT: 40 minutes.    Theodoro Grist M.D on 11/01/2014 at 3:54 PM  Between 7am to 6pm - Pager - [phone]  After 6pm go to [email or web address] - password [email or web address] [place] [facility]  Office  [phone]  CC: Primary care physician; Drema Pry, DO

## 2014-11-01 NOTE — Progress Notes (Addendum)
Discharge instructions given to patient and her sisters. Education completed for chest pain, vascular access site care, metoprolol, amlodipine, and smoking cessation. Patient will follow up with PCP and Dr. Humphrey Rolls. Instructed to follow up on her sodium level with her PCP. Patient has no further questions. IV's and tele discontinued. Prescriptions sent to patient's pharmacy.

## 2014-11-01 NOTE — Progress Notes (Signed)
*  PRELIMINARY RESULTS* Echocardiogram 2D Echocardiogram has been performed.  [name] [name] 11/01/2014, 2:00 PM

## 2014-11-01 NOTE — Progress Notes (Signed)
SUBJECTIVE: Feeling much better no chest pain   Filed Vitals:   10/31/14 1622 10/31/14 1935 11/01/14 0424 11/01/14 0745  BP: 149/81 161/72 159/70 147/59  Pulse: 87 87 85   Temp: 98.1 F (36.7 C) 99.1 F (37.3 C) 97.9 F (36.6 C) 98.7 F (37.1 C)  TempSrc: Oral Oral Oral Oral  Resp: 18 16 16 19   Height: 4\' 11"  (1.499 m)     Weight: 81 lb 9.6 oz (37.014 kg)  82 lb 1.6 oz (37.24 kg)   SpO2: 100% 99% 96% 99%    Intake/Output Summary (Last 24 hours) at 11/01/14 0847 Last data filed at 11/01/14 0433  Gross per 24 hour  Intake      0 ml  Output    600 ml  Net   -600 ml    LABS: Basic Metabolic Panel:  Recent Labs  10/31/14 1205 11/01/14 0043  NA 132* 133*  K 3.7 3.8  CL 100* 103  CO2 20* 23  GLUCOSE 116* 110*  BUN 16 15  CREATININE 0.69 0.59  CALCIUM 9.2 8.7*  MG 1.4*  --    Liver Function Tests:  Recent Labs  10/31/14 1205  AST 16  ALT 10*  ALKPHOS 92  BILITOT 1.3*  PROT 7.0  ALBUMIN 3.9    Recent Labs  10/31/14 1205  LIPASE 23   CBC:  Recent Labs  10/31/14 1205 11/01/14 0438  WBC 16.0* 13.0*  HGB 15.8 13.7  HCT 47.3* 40.0  MCV 84.9 85.4  PLT 283 245   Cardiac Enzymes:  Recent Labs  10/31/14 1626 10/31/14 2002 11/01/14 0040  TROPONINI 0.25* 0.24* 0.21*   BNP: Invalid input(s): POCBNP D-Dimer: No results for input(s): DDIMER in the last 72 hours. Hemoglobin A1C:  Recent Labs  10/31/14 1626  HGBA1C 5.7   Fasting Lipid Panel:  Recent Labs  11/01/14 0043  CHOL 156  HDL 31*  LDLCALC 100*  TRIG 127  CHOLHDL 5.0   Thyroid Function Tests: No results for input(s): TSH, T4TOTAL, T3FREE, THYROIDAB in the last 72 hours.  Invalid input(s): FREET3 Anemia Panel: No results for input(s): VITAMINB12, FOLATE, FERRITIN, TIBC, IRON, RETICCTPCT in the last 72 hours.   PHYSICAL EXAM General: Well developed, well nourished, in no acute distress HEENT:  Normocephalic and atramatic Neck:  No JVD.  Lungs: Clear bilaterally to  auscultation and percussion. Heart: HRRR . Normal S1 and S2 without gallops or murmurs.  Abdomen: Bowel sounds are positive, abdomen soft and non-tender  Msk:  Back normal, normal gait. Normal strength and tone for age. Extremities: No clubbing, cyanosis or edema.   Neuro: Alert and oriented X 3. Psych:  Good affect, responds appropriately  TELEMETRY: Sinus rhythm  ASSESSMENT AND PLAN: Non-STEMI with atypical present patient, nausea vomiting and palpitation. We will be doing cardiac catheterization today.  Active Problems:   NSTEMI (non-ST elevated myocardial infarction) ([place])    [name] [name] A, MD, [facility] 11/01/2014 8:47 AM

## 2014-11-01 NOTE — Progress Notes (Signed)
Report to telemetry nurse given.  Check right groin for bleeding or hematoma.  Patient will be on bedrest for 2 hours post sheath pull---out of bed at 11:35.  Bilateral pulses are 1's PT's.Marland Kitchen

## 2014-11-01 NOTE — Progress Notes (Signed)
Patient has mild mid LAD disease with normal EF on cath. Cardiac poit of view may go home with f/u office Thursday at 1 pm.

## 2014-11-01 NOTE — Discharge Instructions (Signed)
Groin Insertion Instructions-If you lose feeling or develop tingling or pain in your leg or foot after the procedure, please walk around first.  If the discomfort does not improve , contact your physician and proceed to the nearest emergency room.  Loss of feeling in your leg might mean that a blockage has formed in the artery and this can be appropriately treated.  Limit your activity for the next two days after your procedure.  Avoid stooping, bending, heavy lifting or exertion as this may put pressure on the insertion site.  Resume normal activities in 48 hours.  You may shower after 24 hours but avoid excessive warm water and do not scrub the site.  Remove clear dressing in 48 hours.  If you have had a closure device inserted, do not soak in a tub bath or a hot tub for at least one week. ° °No driving for 48 hours after discharge.  After the procedure, check the insertion site occasionally.  If any oozing occurs or there is apparent swelling, firm pressure over the site will prevent a bruise from forming.  You can not hurt anything by pressing directly on the site.  The pressure stops the bleeding by allowing a small clot to form.  If the bleeding continues after the pressure has been applied for more than 15 minutes, call 911 or go to the nearest emergency room.   ° °The x-ray dye causes you to pass a considerate amount of urine.  For this reason, you will be asked to drink plenty of liquids after the procedure to prevent dehydration.  You may resume you regular diet.  Avoid caffeine products.   ° °For pain at the site of your procedure, take non-aspirin medicines such as Tylenol. ° °Medications: A. Hold Metformin for 48 hours if applicable.  B. Continue taking all your present medications at home unless your doctor prescribes any changes.Groin Insertion Instructions-If you lose feeling or develop tingling or pain in your leg or foot after the procedure, please walk around first.  If the discomfort does not  improve , contact your physician and proceed to the nearest emergency room.  Loss of feeling in your leg might mean that a blockage has formed in the artery and this can be appropriately treated.  Limit your activity for the next two days after your procedure.  Avoid stooping, bending, heavy lifting or exertion as this may put pressure on the insertion site.  Resume normal activities in 48 hours.  You may shower after 24 hours but avoid excessive warm water and do not scrub the site.  Remove clear dressing in 48 hours.  If you have had a closure device inserted, do not soak in a tub bath or a hot tub for at least one week. ° °No driving for 48 hours after discharge.  After the procedure, check the insertion site occasionally.  If any oozing occurs or there is apparent swelling, firm pressure over the site will prevent a bruise from forming.  You can not hurt anything by pressing directly on the site.  The pressure stops the bleeding by allowing a small clot to form.  If the bleeding continues after the pressure has been applied for more than 15 minutes, call 911 or go to the nearest emergency room.   ° °The x-ray dye causes you to pass a considerate amount of urine.  For this reason, you will be asked to drink plenty of liquids after the procedure to prevent dehydration.  You   may resume you regular diet.  Avoid caffeine products.   ° °For pain at the site of your procedure, take non-aspirin medicines such as Tylenol. ° °Medications: A. Hold Metformin for 48 hours if applicable.  B. Continue taking all your present medications at home unless your doctor prescribes any changes. °

## 2014-11-01 NOTE — Progress Notes (Signed)
Patient has had a bag of normal saline post cath, per Dr. Ether Griffins to continue fluids. Patient ambulated outside of the room with no difficulties other than some slight weakness. Site is stable with no bleeding. There is one small ring of blood that has been there, marked with a pen and has not gotten any larger. Sodium level was rechecked and went from 133 to 134. Dr. Ether Griffins notified to make sure patient is still okay for discharge. MD stated patient will need to follow up outpatient to check on her sodium level and is okay to discharge at this time.

## 2014-12-17 ENCOUNTER — Ambulatory Visit (INDEPENDENT_AMBULATORY_CARE_PROVIDER_SITE_OTHER): Payer: BLUE CROSS/BLUE SHIELD | Admitting: Family Medicine

## 2014-12-17 ENCOUNTER — Encounter: Payer: Self-pay | Admitting: Family Medicine

## 2014-12-17 VITALS — BP 130/64 | HR 64 | Temp 97.4°F | Wt 87.0 lb

## 2014-12-17 DIAGNOSIS — H93A1 Pulsatile tinnitus, right ear: Secondary | ICD-10-CM | POA: Diagnosis not present

## 2014-12-17 NOTE — Progress Notes (Signed)
Garret Reddish, MD  Subjective:  [name] [name] is a 54 y.o. year old very pleasant female patient who presents for/with See problem oriented charting ROS- long term headaches with slight more frequency, no blurry vision or double vision. No palpitations or chest pain.   Past Medical History- history NSTEM in October due to hypertensive urgency- recent addition of amlodipine, tobacco abuse, hyperlipidemia, claudication, hypertension, anxiety, chronic pain  Medications- reviewed and updated Current Outpatient Prescriptions  Medication Sig Dispense Refill  . amLODipine (NORVASC) 5 MG tablet Take 1 tablet (5 mg total) by mouth daily. 30 tablet 6  . gabapentin (NEURONTIN) 300 MG capsule Take 300 mg by mouth 2 (two) times daily as needed (for pain.).     Marland Kitchen losartan (COZAAR) 100 MG tablet Take 1 tablet (100 mg total) by mouth daily. 90 tablet 3  . metoprolol tartrate (LOPRESSOR) 25 MG tablet Take 1 tablet (25 mg total) by mouth 2 (two) times daily. 60 tablet 6  . PARoxetine (PAXIL) 20 MG tablet Take 20 mg by mouth daily.     . pravastatin (PRAVACHOL) 40 MG tablet Take 1 tablet (40 mg total) by mouth every evening. 90 tablet 1  . clonazePAM (KLONOPIN) 1 MG tablet TAKE 1/2 TABLET BY MOUTH ONCE TO TWICE DAILY AS NEEDED FOR ANXIETY (Patient not taking: Reported on 12/17/2014) 30 tablet 5  . oxyCODONE-acetaminophen (PERCOCET) 10-325 MG tablet Take 1 tablet by mouth See admin instructions. Reported on 12/17/2014  0  . zolpidem (AMBIEN) 10 MG tablet TAKE 1 TABLET BY MOUTH EVERY NIGHT AT BEDTIME (Patient not taking: Reported on 12/17/2014) 30 tablet 5   No current facility-administered medications for this visit.    Objective: BP 130/64 mmHg  Pulse 64  Temp(Src) 97.4 F (36.3 C)  Wt 87 lb (39.463 kg) Gen: NAD, resting comfortably Mucous membranes are moist. Tympanic membrane normal bilateral with normal ear canals Oropharynx normal CV: RRR no murmurs rubs or gallops Lungs: CTAB no crackles,  wheeze, rhonchi Abdomen: soft/nontender/nondistended/normal bowel sounds. No rebound or guarding.  Ext: no edema Skin: warm, dry Neuro: grossly normal, moves all extremities, hearing and vision intact  Assessment/Plan:  Pulsatile tinnitus S: Ear has felt stopped up. Used syringe to try to get material out and didn't help. Going on at least 6 months. Hearing heart beat in right ear. Can be all day long, other days not as often. Makes it hard to sleep. Seems to be getting slightly worse). Hearing is normal. No medication treatments. Never happened before. High risk patient with history of vascular disease.  A/P: Refer to ENT at this time for further evaluation of pulsatile tinnitus. High risk for vascular lesion (fortunately only gradual progression and given duration of 6 months, doubt needs immediate evaluation but did give sooner Return precautions)   Orders Placed This Encounter  Procedures  . Ambulatory referral to ENT    Referral Priority:  Routine    Referral Type:  Consultation    Referral Reason:  Specialty Services Required    Requested Specialty:  Otolaryngology    Number of Visits Requested:  1

## 2014-12-17 NOTE — Patient Instructions (Signed)
We will call you within a week about your referral to ear, nose, and throat doctors. If you do not hear within 2 weeks, give [place] a call.   Really glad you came in today.   Blood pressure looks great as well with addition of new medicine from hospital

## 2015-04-08 ENCOUNTER — Other Ambulatory Visit: Payer: Self-pay | Admitting: Internal Medicine

## 2015-04-12 ENCOUNTER — Telehealth: Payer: Self-pay | Admitting: Internal Medicine

## 2015-04-12 NOTE — Telephone Encounter (Signed)
Pt need refill for Rx Ambien 10mg  and Klonopin 1mg .  Pharm: [facility] on [address]

## 2015-04-14 MED ORDER — ZOLPIDEM TARTRATE 10 MG PO TABS: 10.0000 mg | ORAL_TABLET | Freq: Every day | ORAL | Status: DC

## 2015-04-14 MED ORDER — CLONAZEPAM 1 MG PO TABS: ORAL_TABLET | ORAL | Status: DC

## 2015-04-14 NOTE — Telephone Encounter (Signed)
Ok to RF x 2 but she will need to establish with another physician for future refills

## 2015-04-14 NOTE — Telephone Encounter (Signed)
Rx called in 

## 2015-05-18 ENCOUNTER — Other Ambulatory Visit: Payer: Self-pay | Admitting: Orthopedic Surgery

## 2015-05-18 DIAGNOSIS — M532X8 Spinal instabilities, sacral and sacrococcygeal region: Secondary | ICD-10-CM

## 2015-06-03 ENCOUNTER — Other Ambulatory Visit: Payer: Self-pay | Admitting: Internal Medicine

## 2015-06-08 ENCOUNTER — Ambulatory Visit
Admission: RE | Admit: 2015-06-08 | Discharge: 2015-06-08 | Disposition: A | Discharge status: Home / Self Care | Payer: 59 | Source: Ambulatory Visit | Attending: Orthopedic Surgery | Admitting: Orthopedic Surgery

## 2015-06-08 ENCOUNTER — Other Ambulatory Visit: Payer: Self-pay | Admitting: Internal Medicine

## 2015-06-08 DIAGNOSIS — M532X8 Spinal instabilities, sacral and sacrococcygeal region: Secondary | ICD-10-CM

## 2015-06-14 ENCOUNTER — Telehealth: Payer: Self-pay | Admitting: Internal Medicine

## 2015-06-14 NOTE — Telephone Encounter (Signed)
Pt has an appt to est with dr Martinique on 08-24-15. Pt is unable to come in sooner. Pt needs refill on ambien and clonazepam [facility]

## 2015-06-14 NOTE — Telephone Encounter (Signed)
Please advise if okay for refill until appointment

## 2015-06-16 NOTE — Telephone Encounter (Signed)
I do not feel comfortable filling neither Rx, she has not followed on these medications/Dx, last OV 12/2014 for tinnitus. I might agree with continuing Clonazepam after OV but no Ambien. If she is interested in other options for insomnia they can be discussed during visit. Thanks, BJ

## 2015-06-17 NOTE — Telephone Encounter (Signed)
[name] [name], please call pt and schedule appt with Dr. Martinique next week. Can not refill meds till seen per Dr. Martinique.

## 2015-06-17 NOTE — Telephone Encounter (Signed)
Pt has an appt on 06-24-15 and is aware md will not prescribed ambien and will discuss options for insomnia

## 2015-06-24 ENCOUNTER — Encounter: Payer: Self-pay | Admitting: Family Medicine

## 2015-06-24 ENCOUNTER — Ambulatory Visit (INDEPENDENT_AMBULATORY_CARE_PROVIDER_SITE_OTHER): Payer: 59 | Admitting: Family Medicine

## 2015-06-24 VITALS — BP 118/62 | HR 87 | Temp 98.7°F | Resp 12 | Ht 59.0 in | Wt 91.3 lb

## 2015-06-24 DIAGNOSIS — I10 Essential (primary) hypertension: Secondary | ICD-10-CM

## 2015-06-24 DIAGNOSIS — F3341 Major depressive disorder, recurrent, in partial remission: Secondary | ICD-10-CM

## 2015-06-24 DIAGNOSIS — G4700 Insomnia, unspecified: Secondary | ICD-10-CM

## 2015-06-24 DIAGNOSIS — F419 Anxiety disorder, unspecified: Secondary | ICD-10-CM | POA: Diagnosis not present

## 2015-06-24 MED ORDER — CLONAZEPAM 1 MG PO TABS: ORAL_TABLET | ORAL | Status: DC

## 2015-06-24 MED ORDER — ZOLPIDEM TARTRATE 10 MG PO TABS: 10.0000 mg | ORAL_TABLET | Freq: Every day | ORAL | Status: DC

## 2015-06-24 MED ORDER — METOPROLOL TARTRATE 25 MG PO TABS: 25.0000 mg | ORAL_TABLET | Freq: Two times a day (BID) | ORAL | Status: DC

## 2015-06-24 MED ORDER — ZOLPIDEM TARTRATE 10 MG PO TABS: 10.0000 mg | ORAL_TABLET | Freq: Every evening | ORAL | Status: DC | PRN

## 2015-06-24 MED ORDER — AMLODIPINE BESYLATE 5 MG PO TABS: 5.0000 mg | ORAL_TABLET | Freq: Every day | ORAL | Status: DC

## 2015-06-24 MED ORDER — CLONAZEPAM 0.5 MG PO TABS: 0.2500 mg | ORAL_TABLET | Freq: Two times a day (BID) | ORAL | Status: DC | PRN

## 2015-06-24 MED ORDER — PAROXETINE HCL 20 MG PO TABS: 30.0000 mg | ORAL_TABLET | Freq: Every day | ORAL | Status: DC

## 2015-06-24 NOTE — Progress Notes (Signed)
Pre visit review using our clinic review tool, if applicable. No additional management support is needed unless otherwise documented below in the visit note. 

## 2015-06-24 NOTE — Patient Instructions (Signed)
A few things to remember from today's visit:   1. Essential hypertension  - amLODipine (NORVASC) 5 MG tablet; Take 1 tablet (5 mg total) by mouth daily.  Dispense: 90 tablet; Refill: 1 - metoprolol tartrate (LOPRESSOR) 25 MG tablet; Take 1 tablet (25 mg total) by mouth 2 (two) times daily.  Dispense: 180 tablet; Refill: 1  2. Depression, major, recurrent, in partial remission (HCC)  - PARoxetine (PAXIL) 20 MG tablet; Take 1.5 tablets (30 mg total) by mouth daily.  Dispense: 45 tablet; Refill: 1  3. Anxiety disorder, unspecified  - PARoxetine (PAXIL) 20 MG tablet; Take 1.5 tablets (30 mg total) by mouth daily.  Dispense: 45 tablet; Refill: 1 - clonazePAM (KLONOPIN) 0.5 MG tablet; Take 0.5 tablets (0.25 mg total) by mouth 2 (two) times daily as needed for anxiety.  Dispense: 30 tablet; Refill: 0  4. INSOMNIA, CHRONIC  - zolpidem (AMBIEN) 10 MG tablet; Take 1 tablet (10 mg total) by mouth at bedtime.  Dispense: 30 tablet; Refill: 2   Clonazepam dose decreased, we will try to wean off. Paxil increased. No changes in Ambien. Good sleep hygiene. Check blood pressure at home, today on lowe normal range.  Follow up in 6-8 weeks, labs will be ordered then.   If you sign-up for My chart, you can communicate easier with [place] in case you have any question or concern.

## 2015-06-24 NOTE — Progress Notes (Signed)
HPI:   [name] [name] is a 55 y.o. female, who is here today with her granddaughter to establish care with me and to follow on some of her chronic medical problems.  Former PCP: Dr Shawna Orleans.  Last preventive routine visit: Not sure "and not interested", she adds. She tells me that she has enough doctors appt elsewhere and just comes to this office if she is sick.   Concerns today: Medications refill: Clonazepam and Ambien.  Hx of chronic pain, she follows with pain clinic for chronic back pain, planning on having stimulator place so she can decrease or wean off opioid medication.   Since her last follow up, she has seen ENT for right ear tinnitus, according to pt "everything was fine."   Depression/anxiety:  Currently she is on Paxil 20 mg daily, which she has been on for about 2 years. Prior medications include: Cymbalta, did not help.  Hospitalizations due to psychiatric disorders: Denies. Hx of bipolar disorder Denies. Insomnia/sleep disorder Yes. Suicidal thoughts/ideation Denies.  Son with Hx of bipolar disorder. She takes Clonazepam 1 mg 1/2 tab bid as needed, it causes drowsiness but overall it is well tolerated.  Lives with sister.  Insomnia:  She is on Ambien 10 mg, lower dose does not help. She ran out of Ambien a few days ago and has not been able to sleep, + fatigue. She sleeps about 7 hours, feels rested, cannot sleep w/o Ambien. She has tried Amitriptyline, did not help.    Hypertension: Currently she is on Amlodipine 5 mg and Metoprolol Tartrate 25 mg bid. She is taking medications as instructed, no side effects reported.  She has not noted unusual headache, visual changes, exertional chest pain, dyspnea,  focal weakness, or edema.  Hospitalized for NSTEMI in 10/2014 and elevated BP after stopping her antihypertensive medications.  Lab Results  Component Value Date   CREATININE 0.59 11/01/2014   BUN 15 11/01/2014   NA 134* 11/01/2014   K 3.8  11/01/2014   CL 103 11/01/2014   CO2 23 11/01/2014       Hyperlipidemia: She is on Pravastatin. + CAD, 10/2014, she did not follow with cardiologists. + Smoker, she is not interested in quitting.  Following a low fat diet. She is not on statin, reports that she did not tolerate medications well, achy muscles.   Lab Results  Component Value Date   CHOL 156 11/01/2014   HDL 31* 11/01/2014   LDLCALC 100* 11/01/2014   TRIG 127 11/01/2014   CHOLHDL 5.0 11/01/2014        Review of Systems  Constitutional: Positive for fatigue. Negative for fever, activity change and appetite change.  HENT: Negative for mouth sores, nosebleeds and trouble swallowing.   Eyes: Negative for pain, redness and visual disturbance.  Respiratory: Negative for cough, shortness of breath and wheezing.        + Smoker.  Cardiovascular: Negative for chest pain, palpitations and leg swelling.  Gastrointestinal: Negative for nausea, vomiting and abdominal pain.       Negative for changes in bowel habits.  Genitourinary: Negative for dysuria, hematuria, decreased urine volume and difficulty urinating.  Musculoskeletal: Positive for back pain and arthralgias. Negative for gait problem.  Skin: Negative for color change and rash.  Neurological: Negative for seizures, syncope, weakness and headaches.  Psychiatric/Behavioral: Positive for sleep disturbance. Negative for suicidal ideas, hallucinations and confusion. The patient is nervous/anxious.       Current Outpatient Prescriptions on File Prior  to Visit  Medication Sig Dispense Refill  . oxyCODONE-acetaminophen (PERCOCET) 10-325 MG tablet Take 1 tablet by mouth See admin instructions. Reported on 12/17/2014  0  . gabapentin (NEURONTIN) 300 MG capsule Take 300 mg by mouth 2 (two) times daily as needed (for pain.).     Marland Kitchen losartan (COZAAR) 100 MG tablet Take 1 tablet (100 mg total) by mouth daily. (Patient not taking: Reported on 06/24/2015) 90 tablet 3  .  pravastatin (PRAVACHOL) 40 MG tablet Take 1 tablet (40 mg total) by mouth every evening. (Patient not taking: Reported on 06/24/2015) 90 tablet 1   No current facility-administered medications on file prior to visit.     Past Medical History  Diagnosis Date  . Hyperlipidemia   . Hypertension   . Atypical chest pain   . Depression   . Tobacco abuse   . Chronic insomnia   . PVD (peripheral vascular disease) ([place])     segmental femeropoliteal disease  . Back pain     chronic   No Known Allergies  Family History  Problem Relation Age of Onset  . Alcohol abuse Father     deceased age 34  . Emphysema Mother     deceased age 66  . ADD / ADHD Son     Social History   Social History  . Marital Status: Single    Spouse Name: N/A  . Number of Children: N/A  . Years of Education: N/A   Social History Main Topics  . Smoking status: Current Every Day Smoker -- 0.50 packs/day  . Smokeless tobacco: Never Used  . Alcohol Use: No  . Drug Use: None  . Sexual Activity: Not Asked   Other Topics Concern  . None   Social History Narrative   Divorced with 4 children    Current Smoker 1ppd    No alcohol   No illicit drug use      Occupation: Tax Architect Vitals:   06/24/15 1521  BP: 118/62  Pulse: 87  Temp: 98.7 F (37.1 C)  Resp: 12    Body mass index is 18.43 kg/(m^2).   SpO2 Readings from Last 3 Encounters:  06/24/15 95%  11/01/14 96%  07/29/13 96%     Physical Exam  Constitutional: She is oriented to person, place, and time. She appears well-developed. No distress.  Underwt.  HENT:  Head: Atraumatic.  Mouth/Throat: Oropharynx is clear and moist and mucous membranes are normal.  Eyes: Conjunctivae and EOM are normal. Pupils are equal, round, and reactive to light.  Cardiovascular: Normal rate and regular rhythm.   No murmur heard. Pulses:      Dorsalis pedis pulses are 1+ on the right side, and 2+ on the left side.       Posterior tibial  pulses are 2+ on the right side, and 2+ on the left side.  Good capillary refill bilateral  Respiratory: Effort normal and breath sounds normal. No respiratory distress.  GI: Soft. She exhibits no mass. There is no hepatomegaly. There is no tenderness.  Musculoskeletal: She exhibits no edema.   No tenderness upon palpation of paraspinal muscles. Pain not elicited with movement on exam table during examination.    Lymphadenopathy:    She has no cervical adenopathy.  Neurological: She is alert and oriented to person, place, and time. She has normal strength. Coordination and gait normal.  Skin: Skin is warm. No erythema.  Psychiatric: Her speech is normal. Her affect  is blunt.  Well groomed, good eye contact.      ASSESSMENT AND PLAN:    Preslei was seen today for new patient (initial visit).  Diagnoses and all orders for this visit:  INSOMNIA, CHRONIC  Has not been able to sleep for the past few days, Ambien seems to help. We discussed some side effects, recommended dose for women, and risk of interaction with some of her medications.  I agree to continue filling Rx, she understands she has to follow as recommended.   -     zolpidem (AMBIEN) 10 MG tablet; Take 1 tablet (10 mg total) by mouth at bedtime as needed for sleep.  Essential hypertension  On lower normal range, recommend monitoring BP at home. No changes in current management. She is not interested in labs today, will wait until next OV. DASH diet recommended. Eye exam recommended annually. F/U in 2-3 months, before if needed.  -     amLODipine (NORVASC) 5 MG tablet; Take 1 tablet (5 mg total) by mouth daily. -     metoprolol tartrate (LOPRESSOR) 25 MG tablet; Take 1 tablet (25 mg total) by mouth 2 (two) times daily.  Depression, major, recurrent, in partial remission ([place])  Stable. ? Bipolar disorder. Paxil increased mainly because anxiety.  -     PARoxetine (PAXIL) 20 MG tablet; Take 1.5 tablets (30 mg  total) by mouth daily.  Anxiety disorder, unspecified  Stable. Because she is on Ambien, I would like to decrease and wean off medication. Paxil dose increased. Instructed about warning signs. F/U in 6-8 weeks, before if needed.   -     PARoxetine (PAXIL) 20 MG tablet; Take 1.5 tablets (30 mg total) by mouth daily. -     Discontinue: clonazePAM (KLONOPIN) 1 MG tablet; TAKE 1/2 TABLET BY MOUTH ONCE DAILY AS NEEDED FOR ANXIETY -     clonazePAM (KLONOPIN) 0.5 MG tablet; Take 0.5 tablets (0.25 mg total) by mouth 2 (two) times daily as needed for anxiety.      -Strongly recommended quitting smoking, not interested.SHe understands adverse effects mainly with Hx of CAD and PAD. She is not interested in following with cardiologists. She is not on Aspirin or statin.   -Will sign medication contract next OV.    Joniah Bednarski G. Martinique, MD  [facility]. [place] office.

## 2015-09-01 ENCOUNTER — Ambulatory Visit: Payer: 59 | Admitting: Primary Care

## 2015-09-01 ENCOUNTER — Ambulatory Visit: Payer: 59 | Admitting: Family Medicine

## 2015-09-01 DIAGNOSIS — Z0289 Encounter for other administrative examinations: Secondary | ICD-10-CM

## 2015-09-15 ENCOUNTER — Ambulatory Visit (INDEPENDENT_AMBULATORY_CARE_PROVIDER_SITE_OTHER): Payer: 59 | Admitting: Primary Care

## 2015-09-15 ENCOUNTER — Encounter: Payer: Self-pay | Admitting: Primary Care

## 2015-09-15 ENCOUNTER — Encounter: Payer: Self-pay | Admitting: *Deleted

## 2015-09-15 DIAGNOSIS — F4323 Adjustment disorder with mixed anxiety and depressed mood: Secondary | ICD-10-CM

## 2015-09-15 DIAGNOSIS — E785 Hyperlipidemia, unspecified: Secondary | ICD-10-CM

## 2015-09-15 DIAGNOSIS — G4700 Insomnia, unspecified: Secondary | ICD-10-CM

## 2015-09-15 DIAGNOSIS — F419 Anxiety disorder, unspecified: Secondary | ICD-10-CM

## 2015-09-15 DIAGNOSIS — F3341 Major depressive disorder, recurrent, in partial remission: Secondary | ICD-10-CM | POA: Diagnosis not present

## 2015-09-15 DIAGNOSIS — M549 Dorsalgia, unspecified: Secondary | ICD-10-CM

## 2015-09-15 DIAGNOSIS — G8929 Other chronic pain: Secondary | ICD-10-CM

## 2015-09-15 DIAGNOSIS — I10 Essential (primary) hypertension: Secondary | ICD-10-CM

## 2015-09-15 MED ORDER — AMLODIPINE BESYLATE 5 MG PO TABS: 5.0000 mg | ORAL_TABLET | Freq: Every day | ORAL | 11 refills | Status: DC

## 2015-09-15 MED ORDER — METOPROLOL TARTRATE 25 MG PO TABS: 25.0000 mg | ORAL_TABLET | Freq: Two times a day (BID) | ORAL | 11 refills | Status: DC

## 2015-09-15 MED ORDER — ZOLPIDEM TARTRATE 10 MG PO TABS: 10.0000 mg | ORAL_TABLET | Freq: Every evening | ORAL | 0 refills | Status: DC | PRN

## 2015-09-15 MED ORDER — HYDROXYZINE HCL 25 MG PO TABS: 25.0000 mg | ORAL_TABLET | Freq: Two times a day (BID) | ORAL | 2 refills | Status: DC | PRN

## 2015-09-15 MED ORDER — PAROXETINE HCL 20 MG PO TABS: 30.0000 mg | ORAL_TABLET | Freq: Every day | ORAL | 11 refills | Status: DC

## 2015-09-15 NOTE — Assessment & Plan Note (Signed)
Managed on Ambien 10 mg for years. Feels well managed at this dose and is requesting refills. UDS and controlled substance contract updated today. Refill provided.

## 2015-09-15 NOTE — Assessment & Plan Note (Signed)
Blood pressure stable on Norvasc and Lopressor. Continue current regimen.

## 2015-09-15 NOTE — Patient Instructions (Signed)
I sent refills of your medications to your pharmacy.  Try hydroxyzine 25 mg tablets for breakthrough anxiety. Take 1 tablet by mouth twice daily as needed. Use this sparingly.  Please schedule a physical with me in November. You may also schedule a lab only appointment 3-4 days prior. We will discuss your lab results in detail during your physical.  It was a pleasure to meet you today! Please don't hesitate to call me with any questions. Welcome to [facility]!

## 2015-09-15 NOTE — Progress Notes (Signed)
Subjective:    Patient ID: [name] [name], female    DOB: [birth year], 55 y.o.   MRN: [id number]  HPI  [name] is a 55 year old female who presents today to transfer care from [facility]. Her last physical was several years ago.   1) Chronic Back Pain: Currently managed on Xtampza ER 18 mg, percocet, gabapentin. Long history of chronic back pain and follows with Dr. Andree Elk with [facility] once monthly. She has SI joint disorder bilaterally.   2) Essential Hypertension: Currently managed on Amlodipine 5 mg and Metoprolol 25 mg BID. Denies chest pain, lower extremity edema, dizziness.  3) Hyperlipidemia: Previously managed on Pravastatin. Lipids panel in October 2016 stable. She has not taken her pravastatin in 2 years as it was never refilled by her previous provider.   4) Insomnia: Long history of insomnia. Currently managed on Ambien 10 mg and has been taking since 2011. Overall she feels well managed.   5) Anxiety and Depression. Diagnosed in 2011. Currently managed on Paxil 30 (1.5 tablets) and Clonazepam 0.5 mg. She's not had her Clonazepam in 1 month as she's been out of her medication. She takes her Clonazepam every 2-3 days on average. Denies panic attacks, increased anxiety, depression, SI/HI.  Review of Systems  Respiratory: Negative for shortness of breath.   Cardiovascular: Negative for chest pain, palpitations and leg swelling.  Musculoskeletal: Positive for back pain.  Neurological: Negative for dizziness.  Psychiatric/Behavioral: Negative for sleep disturbance and suicidal ideas. The patient is not nervous/anxious.        Past Medical History:  Diagnosis Date  . Atypical chest pain   . Back pain    chronic  . Chronic insomnia   . Depression   . Hyperlipidemia   . Hypertension   . PVD (peripheral vascular disease) ([place])    segmental femeropoliteal disease  . Tobacco abuse      Social History   Social History  . Marital status: Divorced      Spouse name: N/A  . Number of children: N/A  . Years of education: N/A   Occupational History  . Not on file.   Social History Main Topics  . Smoking status: Current Every Day Smoker    Packs/day: 0.50  . Smokeless tobacco: Never Used  . Alcohol use No  . Drug use: Unknown  . Sexual activity: Not on file   Other Topics Concern  . Not on file   Social History Narrative   Divorced with 4 children    Current Smoker 1ppd    No alcohol   No illicit drug use      Occupation: Tax preparation        Past Surgical History:  Procedure Laterality Date  . BUNIONECTOMY  1996   bilateral  . CARDIAC CATHETERIZATION N/A 11/01/2014   Procedure: Left Heart Cath and Coronary Angiography;  Surgeon: Dionisio David, MD;  Location: [facility];  Service: Cardiovascular;  Laterality: N/A;  . CERVICAL DISCECTOMY  06/21/2006    and fusion   . CESAREAN SECTION  1990  . ENDOMETRIAL ABLATION  June 2010  . NASAL SINUS SURGERY  1999  . TUBAL LIGATION    . WRIST SURGERY  2005   right wrist    Family History  Problem Relation Age of Onset  . Alcohol abuse Father     deceased age 68  . Emphysema Mother     deceased age 29  . ADD / ADHD  Son     No Known Allergies  Current Outpatient Prescriptions on File Prior to Visit  Medication Sig Dispense Refill  . clonazePAM (KLONOPIN) 0.5 MG tablet Take 0.5 tablets (0.25 mg total) by mouth 2 (two) times daily as needed for anxiety. 30 tablet 0  . gabapentin (NEURONTIN) 300 MG capsule Take 300 mg by mouth 2 (two) times daily as needed (for pain.).     Marland Kitchen oxyCODONE-acetaminophen (PERCOCET) 10-325 MG tablet Take 1 tablet by mouth See admin instructions. Reported on 12/17/2014  0   No current facility-administered medications on file prior to visit.     BP 132/74   Pulse 89   Temp 98.3 F (36.8 C) (Oral)   Ht 4\' 11"  (1.499 m)   Wt 96 lb 12.8 oz (43.9 kg)   SpO2 98%   BMI 19.55 kg/m    Objective:   Physical Exam  Constitutional:  She appears well-nourished.  Neck: Neck supple.  Cardiovascular: Normal rate and regular rhythm.   Pulmonary/Chest: Effort normal and breath sounds normal.  Skin: Skin is warm and dry.  Psychiatric: She has a normal mood and affect.          Assessment & Plan:

## 2015-09-15 NOTE — Assessment & Plan Note (Signed)
Chronic. [facility] [place]. Follows monthly.

## 2015-09-15 NOTE — Assessment & Plan Note (Signed)
Stable on Paxil 30 mg tablets. Has not had clonazepam in over one month as she is run out of this medication. Discussed that I do not support use of this medication and will try alternative such as hydroxyzine. Hydroxyzine sent to pharmacy to use as needed for anxiety. Discussed use of this medication with drowsiness precautions provided. We'll continue to monitor.

## 2015-09-15 NOTE — Assessment & Plan Note (Signed)
Has not taken pravastatin and over one year. Lipid panel in October 2016 stable. We'll continue to monitor lipids and repeat at upcoming physical.

## 2015-09-15 NOTE — Progress Notes (Signed)
Pre visit review using our clinic review tool, if applicable. No additional management support is needed unless otherwise documented below in the visit note. 

## 2015-10-28 ENCOUNTER — Other Ambulatory Visit: Payer: Self-pay | Admitting: Primary Care

## 2015-10-28 DIAGNOSIS — Z0000 Encounter for general adult medical examination without abnormal findings: Secondary | ICD-10-CM

## 2015-10-28 DIAGNOSIS — E785 Hyperlipidemia, unspecified: Secondary | ICD-10-CM

## 2015-11-04 ENCOUNTER — Other Ambulatory Visit: Payer: 59

## 2015-11-11 ENCOUNTER — Encounter: Payer: 59 | Admitting: Primary Care

## 2015-11-11 DIAGNOSIS — Z0289 Encounter for other administrative examinations: Secondary | ICD-10-CM

## 2015-11-15 ENCOUNTER — Telehealth: Payer: Self-pay | Admitting: Primary Care

## 2015-11-15 NOTE — Telephone Encounter (Signed)
Patient did not come in for their appointment on 11/11/15 for cpe.  Please let me know if patient needs to be contacted immediately for follow up or no follow up needed.

## 2015-11-15 NOTE — Telephone Encounter (Signed)
Yes, please reschedule at her convenience.  

## 2015-11-16 NOTE — Telephone Encounter (Signed)
Called to reschedule appt - no answer

## 2015-12-22 ENCOUNTER — Other Ambulatory Visit: Payer: Self-pay | Admitting: Primary Care

## 2015-12-22 DIAGNOSIS — G4700 Insomnia, unspecified: Secondary | ICD-10-CM

## 2015-12-23 NOTE — Telephone Encounter (Signed)
Ok to refill? Electronically refill request for   zolpidem (AMBIEN) 10 MG tablet  Last prescribed and seen on 09/15/2015.

## 2015-12-23 NOTE — Telephone Encounter (Signed)
Called in medication to the pharmacy as instructed. 

## 2016-01-18 ENCOUNTER — Other Ambulatory Visit: Payer: Self-pay | Admitting: Primary Care

## 2016-01-18 DIAGNOSIS — F3341 Major depressive disorder, recurrent, in partial remission: Secondary | ICD-10-CM

## 2016-01-20 NOTE — Telephone Encounter (Signed)
Ok to refill? Electronically refill request for   hydrOXYzine (ATARAX/VISTARIL) 25 MG tablet  Last prescribed and seen on 09/15/2015.

## 2016-01-25 DIAGNOSIS — M47817 Spondylosis without myelopathy or radiculopathy, lumbosacral region: Secondary | ICD-10-CM | POA: Diagnosis not present

## 2016-01-25 DIAGNOSIS — G894 Chronic pain syndrome: Secondary | ICD-10-CM | POA: Diagnosis not present

## 2016-01-25 DIAGNOSIS — Z79899 Other long term (current) drug therapy: Secondary | ICD-10-CM | POA: Diagnosis not present

## 2016-01-25 DIAGNOSIS — M5137 Other intervertebral disc degeneration, lumbosacral region: Secondary | ICD-10-CM | POA: Diagnosis not present

## 2016-01-25 DIAGNOSIS — Z79891 Long term (current) use of opiate analgesic: Secondary | ICD-10-CM | POA: Diagnosis not present

## 2016-02-15 ENCOUNTER — Encounter: Payer: Self-pay | Admitting: Primary Care

## 2016-02-15 ENCOUNTER — Ambulatory Visit (INDEPENDENT_AMBULATORY_CARE_PROVIDER_SITE_OTHER): Payer: 59 | Admitting: Primary Care

## 2016-02-15 ENCOUNTER — Other Ambulatory Visit: Payer: Self-pay | Admitting: Primary Care

## 2016-02-15 VITALS — BP 162/82 | HR 82 | Temp 98.1°F | Ht 59.0 in | Wt 95.8 lb

## 2016-02-15 DIAGNOSIS — Z0000 Encounter for general adult medical examination without abnormal findings: Secondary | ICD-10-CM | POA: Diagnosis not present

## 2016-02-15 DIAGNOSIS — R21 Rash and other nonspecific skin eruption: Secondary | ICD-10-CM

## 2016-02-15 DIAGNOSIS — E785 Hyperlipidemia, unspecified: Secondary | ICD-10-CM | POA: Diagnosis not present

## 2016-02-15 DIAGNOSIS — E559 Vitamin D deficiency, unspecified: Secondary | ICD-10-CM

## 2016-02-15 DIAGNOSIS — F4323 Adjustment disorder with mixed anxiety and depressed mood: Secondary | ICD-10-CM

## 2016-02-15 DIAGNOSIS — L089 Local infection of the skin and subcutaneous tissue, unspecified: Secondary | ICD-10-CM

## 2016-02-15 LAB — COMPREHENSIVE METABOLIC PANEL
ALT: 8 U/L (ref 0–35)
AST: 10 U/L (ref 0–37)
Albumin: 3.8 g/dL (ref 3.5–5.2)
Alkaline Phosphatase: 104 U/L (ref 39–117)
BUN: 11 mg/dL (ref 6–23)
CALCIUM: 9.3 mg/dL (ref 8.4–10.5)
CHLORIDE: 102 meq/L (ref 96–112)
CO2: 28 meq/L (ref 19–32)
Creatinine, Ser: 0.75 mg/dL (ref 0.40–1.20)
GFR: 84.49 mL/min (ref >60.00)
GLUCOSE: 91 mg/dL (ref 70–99)
Potassium: 4.4 mEq/L (ref 3.5–5.1)
Sodium: 134 mEq/L — ABNORMAL LOW (ref 135–145)
Total Bilirubin: 0.5 mg/dL (ref 0.2–1.2)
Total Protein: 6.9 g/dL (ref 6.0–8.3)

## 2016-02-15 LAB — LIPID PANEL
CHOL/HDL RATIO: 3
CHOLESTEROL: 126 mg/dL (ref 0–200)
HDL: 40.3 mg/dL (ref >39.00)
LDL CALC: 69 mg/dL (ref 0–99)
NonHDL: 85.5
TRIGLYCERIDES: 81 mg/dL (ref 0.0–149.0)
VLDL: 16.2 mg/dL (ref 0.0–40.0)

## 2016-02-15 LAB — VITAMIN D 25 HYDROXY (VIT D DEFICIENCY, FRACTURES): VITD: 4.85 ng/mL — ABNORMAL LOW (ref 30.00–100.00)

## 2016-02-15 LAB — HEMOGLOBIN A1C: Hgb A1c MFr Bld: 5.5 % (ref 4.6–6.5)

## 2016-02-15 MED ORDER — CEPHALEXIN 500 MG PO CAPS: 500.0000 mg | ORAL_CAPSULE | Freq: Three times a day (TID) | ORAL | 0 refills | Status: DC

## 2016-02-15 MED ORDER — PAROXETINE HCL 40 MG PO TABS: 40.0000 mg | ORAL_TABLET | ORAL | 0 refills | Status: DC

## 2016-02-15 MED ORDER — VITAMIN D (ERGOCALCIFEROL) 1.25 MG (50000 UNIT) PO CAPS: ORAL_CAPSULE | ORAL | 0 refills | Status: DC

## 2016-02-15 MED ORDER — PREDNISONE 10 MG PO TABS: ORAL_TABLET | ORAL | 0 refills | Status: DC

## 2016-02-15 NOTE — Assessment & Plan Note (Signed)
PHQ 9 score of 15 today. Will increase dose of Paxil to 40 mg. Will call her in 1 month for an update. If no improvement in depression, consider CBT and/or adding Wellbutrin. Denies SI/HI.

## 2016-02-15 NOTE — Progress Notes (Signed)
Pre visit review using our clinic review tool, if applicable. No additional management support is needed unless otherwise documented below in the visit note. 

## 2016-02-15 NOTE — Patient Instructions (Signed)
Toe Infection and Cough: Start Cephalexin antibiotics. Take 1 capsule by mouth three times daily for 7 days.  You may continue the Alka-Seltzer cold medication for symptoms.  Skin Spots: Start prednisone tablets. Take three tablets for 2 days, then two tablets for 2 days, then one tablet for 2 days.  Depression: We've increased your Paxil to 40 mg. Take 1 tablet by mouth once daily. Please update me in 1 month.  It was a pleasure to see you today!

## 2016-02-15 NOTE — Progress Notes (Signed)
Subjective:    Patient ID: [name] [name], female    DOB: 1960/04/23, 56 y.o.   MRN: [id number]  HPI  [name] [name] is a 56 year old female who presents today with multiple complaints.  1) Toe Pain: History of fungal infections to toes. Pain, inflammation, and erythema ocated to left great toe that has been present for the past 1 month. She cut her toe 1 month ago and doesn't feel as though it ever resolved. Over the past weekend she's noticed puss coming out of the skin of her toe with increased pain. She's been soaking her toe in baking soda, vinegar. She's applied tea tree oil, coconut oil without much improvement. She denies fevers.  2) Cough: Also with nasal congestion, chills, sore throat. Symptoms have been present for the past 3 weeks. She's taken Alka-Selzer Plus and Day/Night medicine without much improvement. Her cough is non-productive. She's had fevers intermittently.   3) Leg Lesions: Located to the bilateral lower extremities that has been present for the past 1 month. They are itchy in nature. No one else in her household has these spots. She denies changes in soaps, detergents, medications, food. She denies pain and has not noticed insects.  4) Depression: Diagnosed years ago. Currently managed on Paxil 30 mg. She's experiencing symptoms of decreased interest in doing things, doesn't want to leave her house. She thinks a lot of her symptoms are secondary to her chronic back pain. PHQ 9 score of 15 today. She denies SI/HI. She thinks an increase in her Paxil dose would help.  Review of Systems  Constitutional: Positive for fatigue. Negative for fever.  HENT: Positive for congestion. Negative for ear pain, sinus pressure and sore throat.   Respiratory: Positive for cough.   Skin: Positive for color change, rash and wound.  Psychiatric/Behavioral: Negative for suicidal ideas.       Depression       Past Medical History:  Diagnosis Date  . Atypical chest pain   . Back pain    chronic  . Chronic insomnia   . Depression   . Hyperlipidemia   . Hypertension   . PVD (peripheral vascular disease) ([place])    segmental femeropoliteal disease  . Tobacco abuse      Social History   Social History  . Marital status: Divorced    Spouse name: N/A  . Number of children: N/A  . Years of education: N/A   Occupational History  . Not on file.   Social History Main Topics  . Smoking status: Current Every Day Smoker    Packs/day: 0.50  . Smokeless tobacco: Never Used  . Alcohol use No  . Drug use: Unknown  . Sexual activity: Not on file   Other Topics Concern  . Not on file   Social History Narrative   Divorced with 4 children    Current Smoker 1ppd    No alcohol   No illicit drug use      Occupation: Tax preparation        Past Surgical History:  Procedure Laterality Date  . BUNIONECTOMY  1996   bilateral  . CARDIAC CATHETERIZATION N/A 11/01/2014   Procedure: Left Heart Cath and Coronary Angiography;  Surgeon: Dionisio David, MD;  Location: [facility];  Service: Cardiovascular;  Laterality: N/A;  . CERVICAL DISCECTOMY  06/21/2006    and fusion   . CESAREAN SECTION  1990  . ENDOMETRIAL ABLATION  June 2010  . NASAL SINUS SURGERY  [place]    . WRIST SURGERY  2005   right wrist    Family History  Problem Relation Age of Onset  . Alcohol abuse Father     deceased age 43  . Emphysema Mother     deceased age 47  . ADD / ADHD Son     No Known Allergies  Current Outpatient Prescriptions on File Prior to Visit  Medication Sig Dispense Refill  . amLODipine (NORVASC) 5 MG tablet Take 1 tablet (5 mg total) by mouth daily. 30 tablet 11  . hydrOXYzine (ATARAX/VISTARIL) 25 MG tablet TAKE 1 TABLET(25 MG) BY MOUTH TWICE DAILY AS NEEDED FOR ANXIETY 30 tablet 0  . metoprolol tartrate (LOPRESSOR) 25 MG tablet Take 1 tablet (25 mg total) by mouth 2 (two) times daily. 60 tablet 11  . oxyCODONE-acetaminophen (PERCOCET) 10-325 MG tablet  Take 1 tablet by mouth See admin instructions. Reported on 12/17/2014  0  . XTAMPZA ER 18 MG C12A Take 18 mg by mouth 2 (two) times daily.     Marland Kitchen zolpidem (AMBIEN) 10 MG tablet TAKE 1 TABLET BY MOUTH EVERY DAY AT BEDTIME AS NEEDED FOR SLEEP 90 tablet 0   No current facility-administered medications on file prior to visit.     BP (!) 162/82   Pulse 82   Temp 98.1 F (36.7 C) (Oral)   Ht 4\' 11"  (1.499 m)   Wt 95 lb 12.8 oz (43.5 kg)   SpO2 98%   BMI 19.35 kg/m    Objective:   Physical Exam  Constitutional: She appears well-nourished.  HENT:  Right Ear: Tympanic membrane and ear canal normal.  Left Ear: Tympanic membrane and ear canal normal.  Nose: Right sinus exhibits no maxillary sinus tenderness and no frontal sinus tenderness. Left sinus exhibits no maxillary sinus tenderness and no frontal sinus tenderness.  Mouth/Throat: Oropharynx is clear and moist.  Eyes: Conjunctivae are normal.  Neck: Neck supple.  Cardiovascular: Normal rate and regular rhythm.   Pulmonary/Chest: Effort normal and breath sounds normal. She has no wheezes. She has no rales.  Dry cough during exam  Lymphadenopathy:    She has no cervical adenopathy.  Skin: Skin is warm and dry.  Moderate erythema surrounding transverse nail bed to left great toe. Tender. Small open wound. No drainage.  Scabbed sores located to bilateral lower extremities in various stages of healing. No erythema. No s/s of infection.  Psychiatric: She has a normal mood and affect.          Assessment & Plan:  Skin Lesions:  Uncertain of etiology. Present for the past 1 month, located to bilateral lower extremities. Does not appear to be insect related. Rx for Prednisone taper sent to pharmacy. If no improvement, consider dermatology referral.  URI:  Cough, congestion, fatigue x 3 weeks. Also with chest tightness. Non productive cough. Exam today with decreased sounds to airways, but clear. Cough during exam.  Will  treat with steroids and Cephalexin. Follow up PRN.  Toe Infection vs paronychia:   Noted to left great toe. Very similar to paronychia, however, cannot rule out small cellulitis given open wound. No extremity involvement. Treat with Cephalexin course. Discussed warm soaks, continue neosporin. Follow up PRN.  Sheral Flow, NP

## 2016-02-16 LAB — HEPATITIS C ANTIBODY: HCV AB: NEGATIVE

## 2016-02-23 DIAGNOSIS — M47817 Spondylosis without myelopathy or radiculopathy, lumbosacral region: Secondary | ICD-10-CM | POA: Diagnosis not present

## 2016-02-23 DIAGNOSIS — Z79899 Other long term (current) drug therapy: Secondary | ICD-10-CM | POA: Diagnosis not present

## 2016-02-23 DIAGNOSIS — Z79891 Long term (current) use of opiate analgesic: Secondary | ICD-10-CM | POA: Diagnosis not present

## 2016-02-23 DIAGNOSIS — G894 Chronic pain syndrome: Secondary | ICD-10-CM | POA: Diagnosis not present

## 2016-02-23 DIAGNOSIS — M5137 Other intervertebral disc degeneration, lumbosacral region: Secondary | ICD-10-CM | POA: Diagnosis not present

## 2016-03-05 ENCOUNTER — Telehealth: Payer: Self-pay | Admitting: *Deleted

## 2016-03-05 NOTE — Telephone Encounter (Signed)
We will need to see her back in the office for re-evaluation. We are sorry that this hasn't improved.

## 2016-03-05 NOTE — Telephone Encounter (Signed)
Pt states that she saw [name] [name] on 02/15/16 for a toe infection. It is still infected and she was advised if no improvement, to contact office back for advice. Pls advise.

## 2016-03-05 NOTE — Telephone Encounter (Signed)
Spoken to patient and office visit on 03/09/2016

## 2016-03-09 ENCOUNTER — Ambulatory Visit (INDEPENDENT_AMBULATORY_CARE_PROVIDER_SITE_OTHER): Payer: 59 | Admitting: Primary Care

## 2016-03-09 ENCOUNTER — Encounter: Payer: Self-pay | Admitting: Primary Care

## 2016-03-09 VITALS — BP 146/80 | HR 96 | Temp 97.8°F | Ht 59.0 in | Wt 97.1 lb

## 2016-03-09 DIAGNOSIS — M79672 Pain in left foot: Secondary | ICD-10-CM | POA: Diagnosis not present

## 2016-03-09 DIAGNOSIS — L089 Local infection of the skin and subcutaneous tissue, unspecified: Secondary | ICD-10-CM

## 2016-03-09 DIAGNOSIS — M2012 Hallux valgus (acquired), left foot: Secondary | ICD-10-CM | POA: Diagnosis not present

## 2016-03-09 DIAGNOSIS — B353 Tinea pedis: Secondary | ICD-10-CM | POA: Diagnosis not present

## 2016-03-09 DIAGNOSIS — R21 Rash and other nonspecific skin eruption: Secondary | ICD-10-CM

## 2016-03-09 DIAGNOSIS — L600 Ingrowing nail: Secondary | ICD-10-CM | POA: Diagnosis not present

## 2016-03-09 MED ORDER — TRIAMCINOLONE ACETONIDE 0.5 % EX OINT: 1.0000 "application " | TOPICAL_OINTMENT | Freq: Two times a day (BID) | CUTANEOUS | 0 refills | Status: DC

## 2016-03-09 NOTE — Progress Notes (Signed)
Subjective:    Patient ID: [name] [name], female    DOB: September 01, 1960, 56 y.o.   MRN: [id number]  HPI  [name] is a 56 year old female who presents today for multiple complaints.  1) Toe Infection: Presented on 02/15/16 with a chief complaint of erythema and swelling to the left great toe. Presentation was suspicious for cellulitis so she was treated with Cephalexin course. Over the past week she's feeling that her nail is unstable and continues to experience pain. She's noticing green/yellow drainage since her last visit. She does notice yellow discoloration to her toe nails that has progressed. The pain will wake her from sleep. She's tried OTC fungal treatment with some improvement to her other toes. She denies fevers, chills, fatigue.  2) Skin Lesions: Located to the left foot. Last visit she was treated with a prednisone taper for bilateral lesions to left lower extremities. The lesions to her lower extremities have improved, but now she notices lesions to her left foot. She denies itching and pain. She's been using OTC lotion with some improvement.   Review of Systems  Constitutional: Negative for fatigue and fever.  Skin: Positive for color change, rash and wound.       Past Medical History:  Diagnosis Date  . Atypical chest pain   . Back pain    chronic  . Chronic insomnia   . Depression   . Hyperlipidemia   . Hypertension   . PVD (peripheral vascular disease) ([place])    segmental femeropoliteal disease  . Tobacco abuse      Social History   Social History  . Marital status: Divorced    Spouse name: N/A  . Number of children: N/A  . Years of education: N/A   Occupational History  . Not on file.   Social History Main Topics  . Smoking status: Current Every Day Smoker    Packs/day: 0.50  . Smokeless tobacco: Never Used  . Alcohol use No  . Drug use: Unknown  . Sexual activity: Not on file   Other Topics Concern  . Not on file   Social History Narrative   Divorced with 4 children    Current Smoker 1ppd    No alcohol   No illicit drug use      Occupation: Tax preparation        Past Surgical History:  Procedure Laterality Date  . BUNIONECTOMY  1996   bilateral  . CARDIAC CATHETERIZATION N/A 11/01/2014   Procedure: Left Heart Cath and Coronary Angiography;  Surgeon: Dionisio David, MD;  Location: [facility];  Service: Cardiovascular;  Laterality: N/A;  . CERVICAL DISCECTOMY  06/21/2006    and fusion   . CESAREAN SECTION  1990  . ENDOMETRIAL ABLATION  June 2010  . NASAL SINUS SURGERY  1999  . TUBAL LIGATION    . WRIST SURGERY  2005   right wrist    Family History  Problem Relation Age of Onset  . Alcohol abuse Father     deceased age 56  . Emphysema Mother     deceased age 36  . ADD / ADHD Son     No Known Allergies  Current Outpatient Prescriptions on File Prior to Visit  Medication Sig Dispense Refill  . amLODipine (NORVASC) 5 MG tablet Take 1 tablet (5 mg total) by mouth daily. 30 tablet 11  . hydrOXYzine (ATARAX/VISTARIL) 25 MG tablet TAKE 1 TABLET(25 MG) BY MOUTH TWICE DAILY AS NEEDED FOR ANXIETY  30 tablet 0  . metoprolol tartrate (LOPRESSOR) 25 MG tablet Take 1 tablet (25 mg total) by mouth 2 (two) times daily. 60 tablet 11  . oxyCODONE-acetaminophen (PERCOCET) 10-325 MG tablet Take 1 tablet by mouth See admin instructions. Reported on 12/17/2014  0  . PARoxetine (PAXIL) 40 MG tablet Take 1 tablet (40 mg total) by mouth every morning. 90 tablet 0  . Vitamin D, Ergocalciferol, (DRISDOL) 50000 units CAPS capsule Take 1 capsule by mouth once weekly for 12 weeks. 12 capsule 0  . XTAMPZA ER 18 MG C12A Take 18 mg by mouth 2 (two) times daily.     Marland Kitchen zolpidem (AMBIEN) 10 MG tablet TAKE 1 TABLET BY MOUTH EVERY DAY AT BEDTIME AS NEEDED FOR SLEEP 90 tablet 0   No current facility-administered medications on file prior to visit.     BP (!) 146/80   Pulse 96   Temp 97.8 F (36.6 C) (Oral)   Ht 4\' 11"  (1.499 m)   Wt  97 lb 1.9 oz (44.1 kg)   SpO2 98%   BMI 19.62 kg/m    Objective:   Physical Exam  Constitutional: She appears well-nourished.  Cardiovascular: Normal rate and regular rhythm.   Pulmonary/Chest: Effort normal and breath sounds normal.  Skin: Skin is warm.  Numerous circular, dried lesions to left lateral and dorsal foot. Toes on left foot with yellow discoloration. Left great toe nail with mild green drainage to medial side. Tender. Erythema to nail bed. Toe nail unstable.          Assessment & Plan:  Toe Infection:  Located to left great toe. No improvement with cephalexin course. Question bacterial and fungal involvement given presentation. Given no improvement with antibiotics, increased drainage, and nail instability, will send to Podiatry for urgent evaluation. She likely needs a procedural intervention. She will be seen today per Podiatry.  Skin Lesions:  Located now to left foot. Improved to lower extremities. Odd presentation, could be fungal given toenail involvement. Will treat with mid-potency topical steroid. Dermatology referral if no improvement. No cellulitis to lesions.  Sheral Flow, NP

## 2016-03-09 NOTE — Patient Instructions (Signed)
Stop by the front desk and speak with either Rosaria Ferries or Shirlean Mylar regarding your referral to Podiatry.  You may use the triamcinolone ointment twice daily to the foot for the rash.   Please call me when you need a refill of the 20 mg tablets of Paxil.  It was a pleasure to see you today!

## 2016-03-09 NOTE — Progress Notes (Signed)
Pre visit review using our clinic review tool, if applicable. No additional management support is needed unless otherwise documented below in the visit note. 

## 2016-03-15 ENCOUNTER — Other Ambulatory Visit: Payer: Self-pay | Admitting: Primary Care

## 2016-03-15 DIAGNOSIS — G4700 Insomnia, unspecified: Secondary | ICD-10-CM

## 2016-03-16 NOTE — Telephone Encounter (Signed)
Ok to refill? Electronically refill request for zolpidem (AMBIEN) 10 MG tablet. Last prescribed on 12/23/2015. Last seen on 03/09/2016

## 2016-03-16 NOTE — Telephone Encounter (Signed)
Called in medication to the pharmacy as instructed. 

## 2016-03-16 NOTE — Telephone Encounter (Signed)
Yes, please phone in #[phone], no refills.

## 2016-03-27 ENCOUNTER — Ambulatory Visit: Payer: Self-pay | Admitting: Primary Care

## 2016-04-06 ENCOUNTER — Ambulatory Visit (INDEPENDENT_AMBULATORY_CARE_PROVIDER_SITE_OTHER): Payer: 59 | Admitting: Primary Care

## 2016-04-06 ENCOUNTER — Encounter: Payer: Self-pay | Admitting: Primary Care

## 2016-04-06 ENCOUNTER — Other Ambulatory Visit: Payer: Self-pay | Admitting: Primary Care

## 2016-04-06 VITALS — BP 152/94 | HR 91 | Temp 98.2°F | Ht 59.0 in | Wt 94.8 lb

## 2016-04-06 DIAGNOSIS — R21 Rash and other nonspecific skin eruption: Secondary | ICD-10-CM | POA: Diagnosis not present

## 2016-04-06 DIAGNOSIS — D649 Anemia, unspecified: Secondary | ICD-10-CM

## 2016-04-06 DIAGNOSIS — F4323 Adjustment disorder with mixed anxiety and depressed mood: Secondary | ICD-10-CM

## 2016-04-06 DIAGNOSIS — E559 Vitamin D deficiency, unspecified: Secondary | ICD-10-CM

## 2016-04-06 DIAGNOSIS — L089 Local infection of the skin and subcutaneous tissue, unspecified: Secondary | ICD-10-CM

## 2016-04-06 DIAGNOSIS — M79675 Pain in left toe(s): Secondary | ICD-10-CM | POA: Diagnosis not present

## 2016-04-06 DIAGNOSIS — L409 Psoriasis, unspecified: Secondary | ICD-10-CM | POA: Insufficient documentation

## 2016-04-06 LAB — VITAMIN D 25 HYDROXY (VIT D DEFICIENCY, FRACTURES): VITD: 51.66 ng/mL (ref 30.00–100.00)

## 2016-04-06 LAB — CBC WITH DIFFERENTIAL/PLATELET
Basophils Absolute: 0.1 10*3/uL (ref 0.0–0.1)
Basophils Relative: 0.7 % (ref 0.0–3.0)
EOS PCT: 0.5 % (ref 0.0–5.0)
Eosinophils Absolute: 0.1 10*3/uL (ref 0.0–0.7)
HCT: 42 % (ref 36.0–46.0)
Hemoglobin: 14.1 g/dL (ref 12.0–15.0)
LYMPHS ABS: 2.9 10*3/uL (ref 0.7–4.0)
Lymphocytes Relative: 24.2 % (ref 12.0–46.0)
MCHC: 33.5 g/dL (ref 30.0–36.0)
MCV: 90.9 fl (ref 78.0–100.0)
MONOS PCT: 5.5 % (ref 3.0–12.0)
Monocytes Absolute: 0.7 10*3/uL (ref 0.1–1.0)
NEUTROS ABS: 8.2 10*3/uL — AB (ref 1.4–7.7)
NEUTROS PCT: 69.1 % (ref 43.0–77.0)
PLATELETS: 252 10*3/uL (ref 150.0–400.0)
RBC: 4.63 Mil/uL (ref 3.87–5.11)
RDW: 14.2 % (ref 11.5–15.5)
WBC: 11.8 10*3/uL — ABNORMAL HIGH (ref 4.0–10.5)

## 2016-04-06 MED ORDER — PAROXETINE HCL 20 MG PO TABS: 40.0000 mg | ORAL_TABLET | Freq: Every day | ORAL | 1 refills | Status: DC

## 2016-04-06 MED ORDER — SULFAMETHOXAZOLE-TRIMETHOPRIM 800-160 MG PO TABS: 1.0000 | ORAL_TABLET | Freq: Two times a day (BID) | ORAL | 0 refills | Status: DC

## 2016-04-06 NOTE — Assessment & Plan Note (Signed)
Check CBC today given history of anemia with easy bruising to left upper extremity.

## 2016-04-06 NOTE — Assessment & Plan Note (Signed)
Level of 4 on prior labs. Recheck today. Continue 50,000 units weekly.

## 2016-04-06 NOTE — Assessment & Plan Note (Signed)
Unclear etiology. Will send to dermatology for biopsy and evaluation. Check HIV today.

## 2016-04-06 NOTE — Progress Notes (Signed)
Subjective:    Patient ID: [name] [name], female    DOB: 07-Sep-1960, 56 y.o.   MRN: [id number]  HPI  [name] [name] is a 56 year old female who presents today with multiple complaints.  1) Toe Pain: Continued pain to the left great toe. She saw podiatry several weeks ago who removed the toe nail of that toe. She has continued to notice erythema, pain, and drainage to the toe site. She's been applying neosporin and soaking her toe in epsom salt and tea tree oil. She missed her follow up appointment and has not seen podiatry since. She denies fevers.   2) Anxiety and Depression: Currently managed on Paxil 40. Overall doing well on this dose but is needing the 20 mg tablets for insurance purposes.   3) Chronic Back Pain: Currently managed on Percocet and is following with pain management. She is due for a follow up appointment next week.   4) Rash: Located to the left hip, bilateral lower extremities, and bilateral feet. Her rash will scab over, then the site itches, and have not resolved. Overall these sores have been present for the past 4 months. She's had no improvement with prednisone and topical steroids. She has noticed that she's bruising easily to her upper extremities after minor injury or bumping her arm against an object.  Review of Systems  Constitutional: Negative for fatigue and fever.  Musculoskeletal: Positive for back pain.  Skin: Positive for rash and wound.  Hematological: Bruises/bleeds easily.        Past Medical History:  Diagnosis Date  . Atypical chest pain   . Back pain    chronic  . Chronic insomnia   . Depression   . Hyperlipidemia   . Hypertension   . PVD (peripheral vascular disease) ([place])    segmental femeropoliteal disease  . Tobacco abuse      Social History   Social History  . Marital status: Divorced    Spouse name: N/A  . Number of children: N/A  . Years of education: N/A   Occupational History  . Not on file.   Social History Main Topics   . Smoking status: Current Every Day Smoker    Packs/day: 0.50  . Smokeless tobacco: Never Used  . Alcohol use No  . Drug use: Unknown  . Sexual activity: Not on file   Other Topics Concern  . Not on file   Social History Narrative   Divorced with 4 children    Current Smoker 1ppd    No alcohol   No illicit drug use      Occupation: Tax preparation        Past Surgical History:  Procedure Laterality Date  . BUNIONECTOMY  1996   bilateral  . CARDIAC CATHETERIZATION N/A 11/01/2014   Procedure: Left Heart Cath and Coronary Angiography;  Surgeon: Dionisio David, MD;  Location: [facility];  Service: Cardiovascular;  Laterality: N/A;  . CERVICAL DISCECTOMY  06/21/2006    and fusion   . CESAREAN SECTION  1990  . ENDOMETRIAL ABLATION  June 2010  . NASAL SINUS SURGERY  1999  . TUBAL LIGATION    . WRIST SURGERY  2005   right wrist    Family History  Problem Relation Age of Onset  . Alcohol abuse Father     deceased age 73  . Emphysema Mother     deceased age 72  . ADD / ADHD Son     No Known Allergies  Current Outpatient Prescriptions on File Prior to Visit  Medication Sig Dispense Refill  . amLODipine (NORVASC) 5 MG tablet Take 1 tablet (5 mg total) by mouth daily. 30 tablet 11  . hydrOXYzine (ATARAX/VISTARIL) 25 MG tablet TAKE 1 TABLET(25 MG) BY MOUTH TWICE DAILY AS NEEDED FOR ANXIETY 30 tablet 0  . metoprolol tartrate (LOPRESSOR) 25 MG tablet Take 1 tablet (25 mg total) by mouth 2 (two) times daily. 60 tablet 11  . oxyCODONE-acetaminophen (PERCOCET) 10-325 MG tablet Take 1 tablet by mouth See admin instructions. Reported on 12/17/2014  0  . triamcinolone ointment (KENALOG) 0.5 % Apply 1 application topically 2 (two) times daily. 30 g 0  . Vitamin D, Ergocalciferol, (DRISDOL) 50000 units CAPS capsule Take 1 capsule by mouth once weekly for 12 weeks. 12 capsule 0  . XTAMPZA ER 18 MG C12A Take 18 mg by mouth 2 (two) times daily.     Marland Kitchen zolpidem (AMBIEN) 10 MG  tablet TAKE 1 TABLET BY MOUTH EVERY DAY AT BEDTIME 90 tablet 0   No current facility-administered medications on file prior to visit.     BP (!) 152/94   Pulse 91   Temp 98.2 F (36.8 C) (Oral)   Ht 4\' 11"  (1.499 m)   Wt 94 lb 12.8 oz (43 kg)   SpO2 98%   BMI 19.15 kg/m    Objective:   Physical Exam  Constitutional: She appears well-nourished.  Neck: Neck supple.  Cardiovascular: Normal rate and regular rhythm.   Pulmonary/Chest: Effort normal and breath sounds normal.  Skin: Skin is warm.  Mild-moderate erythema to left great toe. No drainage. Rash to left foot and bilateral lower extremities. Rash is scaly, rounded, raised, red. Non tender. Numerous bruises to posterior left upper extremity.  Psychiatric: She has a normal mood and affect.          Assessment & Plan:  Toe Pain:  Located to left great toe x 2 months. Treated per podiatry 1 month ago. Overall toe doesn't appear infected. Will have her continue soaks and dressing changes. Strongly encouraged her to follow up with podiatry as recommended. Will check CBC with diff today. New dressing applied in office today.  Sheral Flow, NP

## 2016-04-06 NOTE — Progress Notes (Signed)
Pre visit review using our clinic review tool, if applicable. No additional management support is needed unless otherwise documented below in the visit note. 

## 2016-04-06 NOTE — Assessment & Plan Note (Signed)
Doing well on increased dose of Paxil. Will send the 20 mg tablets and have her take two to equal 40 mg.

## 2016-04-06 NOTE — Patient Instructions (Signed)
Complete lab work prior to leaving today. I will notify you of your results once received.   Stop by the front desk and speak with either Rosaria Ferries or Shirlean Mylar regarding your referral to Dermatology.  Please schedule a follow up visit with the Podiatrist.   It was a pleasure to see you today!

## 2016-04-07 LAB — HIV ANTIBODY (ROUTINE TESTING W REFLEX): HIV 1&2 Ab, 4th Generation: NONREACTIVE

## 2016-05-18 DIAGNOSIS — L400 Psoriasis vulgaris: Secondary | ICD-10-CM | POA: Diagnosis not present

## 2016-05-18 DIAGNOSIS — L603 Nail dystrophy: Secondary | ICD-10-CM | POA: Diagnosis not present

## 2016-05-18 DIAGNOSIS — L409 Psoriasis, unspecified: Secondary | ICD-10-CM | POA: Diagnosis not present

## 2016-05-18 DIAGNOSIS — D692 Other nonthrombocytopenic purpura: Secondary | ICD-10-CM | POA: Diagnosis not present

## 2016-05-31 ENCOUNTER — Ambulatory Visit (INDEPENDENT_AMBULATORY_CARE_PROVIDER_SITE_OTHER): Payer: 59 | Admitting: Primary Care

## 2016-05-31 ENCOUNTER — Encounter: Payer: Self-pay | Admitting: Primary Care

## 2016-05-31 VITALS — BP 140/80 | HR 80 | Temp 98.2°F | Ht 59.0 in | Wt 99.0 lb

## 2016-05-31 DIAGNOSIS — J069 Acute upper respiratory infection, unspecified: Secondary | ICD-10-CM

## 2016-05-31 MED ORDER — AZITHROMYCIN 250 MG PO TABS: ORAL_TABLET | ORAL | 0 refills | Status: DC

## 2016-05-31 MED ORDER — ALBUTEROL SULFATE HFA 108 (90 BASE) MCG/ACT IN AERS: 2.0000 | INHALATION_SPRAY | RESPIRATORY_TRACT | 0 refills | Status: DC | PRN

## 2016-05-31 MED ORDER — PREDNISONE 20 MG PO TABS: ORAL_TABLET | ORAL | 0 refills | Status: DC

## 2016-05-31 NOTE — Progress Notes (Signed)
Subjective:    Patient ID: [name] [name], female    DOB: [birth year], 58 y.o.   MRN: [id number]  HPI  Ms. Mallicoat is a 56 year old female with a history of tobacco abuse, allergic rhinitis, bronchitis, COPD who presents today with a chief complaint of cough. She also reports chest congestion, wheezing, nasal congestion, sore throat, shortness of breath. Her symptoms began over 2 weeks ago. Over the past week she's noticed progression of her symptoms and is feeling worse.  She's been taking Allegra with decongestant, Alka-Selzer Plus, Ibuprofen without improvement. She has an old prescription for Symbicort from her prior PCP for which she's used a few times without improvement. Her cough is non productive.   Review of Systems  Constitutional: Positive for fatigue. Negative for chills and fever.  HENT: Positive for congestion. Negative for ear pain and sore throat.   Respiratory: Positive for cough, shortness of breath and wheezing.   Cardiovascular: Negative for chest pain.       Past Medical History:  Diagnosis Date  . Atypical chest pain   . Back pain    chronic  . Chronic insomnia   . Depression   . Hyperlipidemia   . Hypertension   . PVD (peripheral vascular disease) ([place])    segmental femeropoliteal disease  . Tobacco abuse      Social History   Social History  . Marital status: Divorced    Spouse name: N/A  . Number of children: N/A  . Years of education: N/A   Occupational History  . Not on file.   Social History Main Topics  . Smoking status: Current Every Day Smoker    Packs/day: 0.50  . Smokeless tobacco: Never Used  . Alcohol use No  . Drug use: Unknown  . Sexual activity: Not on file   Other Topics Concern  . Not on file   Social History Narrative   Divorced with 4 children    Current Smoker 1ppd    No alcohol   No illicit drug use      Occupation: Tax preparation        Past Surgical History:  Procedure Laterality Date  . BUNIONECTOMY  1996     bilateral  . CARDIAC CATHETERIZATION N/A 11/01/2014   Procedure: Left Heart Cath and Coronary Angiography;  Surgeon: Dionisio David, MD;  Location: [facility];  Service: Cardiovascular;  Laterality: N/A;  . CERVICAL DISCECTOMY  06/21/2006    and fusion   . CESAREAN SECTION  1990  . ENDOMETRIAL ABLATION  June 2010  . NASAL SINUS SURGERY  1999  . TUBAL LIGATION    . WRIST SURGERY  2005   right wrist    Family History  Problem Relation Age of Onset  . Alcohol abuse Father        deceased age 13  . Emphysema Mother        deceased age 67  . ADD / ADHD Son     No Known Allergies  Current Outpatient Prescriptions on File Prior to Visit  Medication Sig Dispense Refill  . amLODipine (NORVASC) 5 MG tablet Take 1 tablet (5 mg total) by mouth daily. 30 tablet 11  . hydrOXYzine (ATARAX/VISTARIL) 25 MG tablet TAKE 1 TABLET(25 MG) BY MOUTH TWICE DAILY AS NEEDED FOR ANXIETY 30 tablet 0  . metoprolol tartrate (LOPRESSOR) 25 MG tablet Take 1 tablet (25 mg total) by mouth 2 (two) times daily. 60 tablet 11  . PARoxetine (PAXIL) 20  MG tablet Take 2 tablets (40 mg total) by mouth daily. 180 tablet 1  . triamcinolone ointment (KENALOG) 0.5 % Apply 1 application topically 2 (two) times daily. 30 g 0  . Vitamin D, Ergocalciferol, (DRISDOL) 50000 units CAPS capsule Take 1 capsule by mouth once weekly for 12 weeks. 12 capsule 0  . zolpidem (AMBIEN) 10 MG tablet TAKE 1 TABLET BY MOUTH EVERY DAY AT BEDTIME 90 tablet 0   No current facility-administered medications on file prior to visit.     BP 140/80   Pulse 80   Temp 98.2 F (36.8 C) (Oral)   Ht 4\' 11"  (1.499 m)   Wt 99 lb (44.9 kg)   SpO2 98%   BMI 20.00 kg/m    Objective:   Physical Exam  Constitutional: She appears well-nourished. She appears ill.  HENT:  Right Ear: Tympanic membrane and ear canal normal.  Left Ear: Tympanic membrane and ear canal normal.  Nose: Right sinus exhibits no maxillary sinus tenderness and no  frontal sinus tenderness. Left sinus exhibits no maxillary sinus tenderness and no frontal sinus tenderness.  Mouth/Throat: Oropharynx is clear and moist.  Eyes: Conjunctivae are normal.  Neck: Neck supple.  Cardiovascular: Normal rate and regular rhythm.   Pulmonary/Chest: Effort normal. She has wheezes in the right upper field and the left upper field. She has rhonchi in the right lower field and the left lower field. She has no rales.  Lymphadenopathy:    She has no cervical adenopathy.  Skin: Skin is warm and dry.          Assessment & Plan:  Acute bronchitis:  Cough, congestion, wheezing, shortness of breath 2+ weeks. Overall feeling worse, no improvement with OTC treatment. Exam today with mild wheezing and rhonchi as noted above. She does appear acutely ill and symptoms are suspicious for bacterial involvement. Prescription for azithromycin, prednisone course, albuterol inhaler sent to pharmacy for treatment. Discussed to notify if she requires use of her albuterol inhaler or than 3 times weekly after she recovers from her illness. Consider ICS/LABA in the future.  Sheral Flow, NP

## 2016-05-31 NOTE — Patient Instructions (Signed)
Start Azithromycin antibiotics. Take 2 tablets by mouth today, then 1 tablet daily for 4 additional days.  Start prednisone tablets for shortness of breath and wheezing. Take 2 tablets daily for 5 days.  Shortness of Breath/Wheezing/Cough: Use the albuterol inhaler. Inhale 2 puffs into the lungs every 4-6 hours as needed for wheezing and/or shortness of breath.   Try Mucinex DM for cough and congestion. Take this with a full glass of water.  It was a pleasure to see you today!

## 2016-06-01 ENCOUNTER — Encounter: Payer: Self-pay | Admitting: Primary Care

## 2016-06-01 DIAGNOSIS — Z79899 Other long term (current) drug therapy: Secondary | ICD-10-CM | POA: Diagnosis not present

## 2016-06-07 ENCOUNTER — Other Ambulatory Visit: Payer: Self-pay | Admitting: Primary Care

## 2016-06-07 DIAGNOSIS — G4700 Insomnia, unspecified: Secondary | ICD-10-CM

## 2016-06-07 DIAGNOSIS — F3341 Major depressive disorder, recurrent, in partial remission: Secondary | ICD-10-CM

## 2016-06-08 NOTE — Telephone Encounter (Signed)
Hydroxyzine last filled on 01/20/16 #30 ambien last filled on 03/16/16 #90  Pt saw you for an acute visit on 04/06/16

## 2016-06-11 NOTE — Telephone Encounter (Signed)
Called in medication to the pharmacy as instructed. 

## 2016-09-03 ENCOUNTER — Other Ambulatory Visit: Payer: Self-pay | Admitting: Primary Care

## 2016-09-03 DIAGNOSIS — G4700 Insomnia, unspecified: Secondary | ICD-10-CM

## 2016-09-04 NOTE — Telephone Encounter (Signed)
Ok to refill? Electronically refill request for zolpidem (AMBIEN) 10 MG tablet.  Last prescribed on 06/10/2016. Last seen on 05/31/2016. Patient is up to date on UDS until 11/2016.

## 2016-09-04 NOTE — Telephone Encounter (Signed)
Okay to phone in for #90, no refills.

## 2016-09-04 NOTE — Telephone Encounter (Signed)
Called in medication to the pharmacy as instructed. 

## 2016-09-27 ENCOUNTER — Other Ambulatory Visit: Payer: Self-pay | Admitting: Primary Care

## 2016-09-27 DIAGNOSIS — I10 Essential (primary) hypertension: Secondary | ICD-10-CM

## 2016-10-05 ENCOUNTER — Other Ambulatory Visit: Payer: Self-pay | Admitting: Primary Care

## 2016-10-05 DIAGNOSIS — F4323 Adjustment disorder with mixed anxiety and depressed mood: Secondary | ICD-10-CM

## 2016-11-30 ENCOUNTER — Other Ambulatory Visit: Payer: Self-pay | Admitting: Primary Care

## 2016-11-30 DIAGNOSIS — G4700 Insomnia, unspecified: Secondary | ICD-10-CM

## 2016-11-30 NOTE — Telephone Encounter (Signed)
Ok to refill? Electronically refill request for zolpidem (AMBIEN) 10 MG tablet  Last prescribed on 09/04/2016. Last seen on 05/31/2016

## 2016-12-01 NOTE — Telephone Encounter (Signed)
Okay to refill for 90 days, no refill. Needs office visit for follow up or CPE in February/March 2019, please schedule. She will be due for CPE.

## 2016-12-05 NOTE — Telephone Encounter (Signed)
Called in medication to the pharmacy as instructed. 

## 2016-12-07 ENCOUNTER — Ambulatory Visit (INDEPENDENT_AMBULATORY_CARE_PROVIDER_SITE_OTHER): Payer: 59 | Admitting: Primary Care

## 2016-12-07 ENCOUNTER — Encounter: Payer: Self-pay | Admitting: Primary Care

## 2016-12-07 VITALS — BP 120/70 | HR 70 | Temp 97.9°F | Ht 59.0 in | Wt 110.0 lb

## 2016-12-07 DIAGNOSIS — L03019 Cellulitis of unspecified finger: Secondary | ICD-10-CM

## 2016-12-07 MED ORDER — CEPHALEXIN 500 MG PO CAPS: 500.0000 mg | ORAL_CAPSULE | Freq: Three times a day (TID) | ORAL | 0 refills | Status: DC

## 2016-12-07 NOTE — Telephone Encounter (Signed)
Patient had an appt on 12/07/2016 and was notified to schedule the follow up or CPE.

## 2016-12-07 NOTE — Progress Notes (Signed)
Subjective:    Patient ID: [name] [name], female    DOB: 03-20-1960, 56 y.o.   MRN: [id number]  HPI  [name] [name] is a 56 year old female with a history of psoriasis who presents today with a chief complaint of finger pain.   1) Finger Pain: Her pain is located to all fingers, but mostly to the bilateral index fingers. Her symptoms have been present for the past 3 months. Over the past 2 months she's noticed gradual increase in erythema to bilateral index fingers.  The redness has since moved distal to her left fingernail encroaching mid finger.    She's applied alcohol, neosporin, peroxide without improvement. She will notice increased pain and redness after washing dishes. She denies fevers, chills. She's also noticed yellow discoloration and a disfigurement to her finger nails for the same time period.    Review of Systems  Constitutional: Positive for fatigue. Negative for fever.  Skin: Positive for color change.        Past Medical History:  Diagnosis Date  . Atypical chest pain   . Back pain    chronic  . Chronic insomnia   . Depression   . Hyperlipidemia   . Hypertension   . PVD (peripheral vascular disease) ([place])    segmental femeropoliteal disease  . Tobacco abuse      Social History   Socioeconomic History  . Marital status: Divorced    Spouse name: Not on file  . Number of children: Not on file  . Years of education: Not on file  . Highest education level: Not on file  Social Needs  . Financial resource strain: Not on file  . Food insecurity - worry: Not on file  . Food insecurity - inability: Not on file  . Transportation needs - medical: Not on file  . Transportation needs - non-medical: Not on file  Occupational History  . Not on file  Tobacco Use  . Smoking status: Current Every Day Smoker    Packs/day: 0.50  . Smokeless tobacco: Never Used  Substance and Sexual Activity  . Alcohol use: No  . Drug use: Not on file  . Sexual activity: Not on file    Other Topics Concern  . Not on file  Social History Narrative   Divorced with 4 children    Current Smoker 1ppd    No alcohol   No illicit drug use      Occupation: Tax preparation        Past Surgical History:  Procedure Laterality Date  . BUNIONECTOMY  1996   bilateral  . CARDIAC CATHETERIZATION N/A 11/01/2014   Procedure: Left Heart Cath and Coronary Angiography;  Surgeon: Dionisio David, MD;  Location: [facility];  Service: Cardiovascular;  Laterality: N/A;  . CERVICAL DISCECTOMY  06/21/2006    and fusion   . CESAREAN SECTION  1990  . ENDOMETRIAL ABLATION  June 2010  . NASAL SINUS SURGERY  1999  . TUBAL LIGATION    . WRIST SURGERY  2005   right wrist    Family History  Problem Relation Age of Onset  . Alcohol abuse Father        deceased age 56  . Emphysema Mother        deceased age 46  . ADD / ADHD Son     No Known Allergies  Current Outpatient Medications on File Prior to Visit  Medication Sig Dispense Refill  . albuterol (PROVENTIL HFA;VENTOLIN HFA) 108 (  90 Base) MCG/ACT inhaler Inhale 2 puffs into the lungs every 4 (four) hours as needed for wheezing or shortness of breath. 1 Inhaler 0  . amLODipine (NORVASC) 5 MG tablet TAKE 1 TABLET(5 MG) BY MOUTH DAILY 30 tablet 5  . hydrOXYzine (ATARAX/VISTARIL) 25 MG tablet TAKE 1 TABLET(25 MG) BY MOUTH TWICE DAILY AS NEEDED FOR ANXIETY 30 tablet 0  . metoprolol tartrate (LOPRESSOR) 25 MG tablet Take 1 tablet (25 mg total) by mouth 2 (two) times daily. 60 tablet 11  . PARoxetine (PAXIL) 20 MG tablet TAKE 2 TABLETS(40 MG) BY MOUTH DAILY 180 tablet 1  . zolpidem (AMBIEN) 10 MG tablet TAKE 1 TABLET BY MOUTH AT BEDTIME AS NEEDED FOR SLEEP 90 tablet 0   No current facility-administered medications on file prior to visit.     BP 120/70   Pulse 70   Temp 97.9 F (36.6 C) (Oral)   Ht 4\' 11"  (1.499 m)   Wt 110 lb (49.9 kg)   SpO2 98%   BMI 22.22 kg/m    Objective:   Physical Exam  Constitutional: She  appears well-nourished.  Cardiovascular: Normal rate.  Pulmonary/Chest: Effort normal.  Skin: Skin is warm and dry. There is erythema.  Mild erythema surrounding nailbeds of most finger nails bilaterally.  Moderate erythema to left index finger with erythema moving distally from fingernail.  Tender, warm to touch.          Assessment & Plan:  Paronychia:  Located to several nailbeds of bilateral hands, worst to left index finger. Given gradual movement of erythema coupled with tenderness and warmth, will treat for presumed bacterial involvement. Prescription for Keflex 500 mg 3 times daily sent to pharmacy. Discussed warm soaks and iodine and water, apply Neosporin after each soak. Evidence of fungal involvement to nails, consider treating after antibiotics. She will update.  Sheral Flow, NP

## 2016-12-07 NOTE — Patient Instructions (Signed)
Start Cephalexin 500 mg capsules. Take 1 capsule by mouth three times daily.  Soak the finger in warm water with some iodine three times daily. Apply neosporin after each soak.  Schedule a follow up visit in February or March 2019.  It was a pleasure to see you today!  Paronychia Paronychia is an infection of the skin that surrounds a nail. It usually affects the skin around a fingernail, but it may also occur near a toenail. It often causes pain and swelling around the nail. This condition may come on suddenly or develop over a longer period. In some cases, a collection of pus (abscess) can form near or under the nail. Usually, paronychia is not serious and it clears up with treatment. What are the causes? This condition may be caused by bacteria or fungi. It is commonly caused by either Streptococcus or Staphylococcus bacteria. The bacteria or fungi often cause the infection by getting into the affected area through an opening in the skin, such as a cut or a hangnail. What increases the risk? This condition is more likely to develop in:  People who get their hands wet often, such as those who work as Designer, industrial/product, bartenders, or nurses.  People who bite their fingernails or suck their thumbs.  People who trim their nails too short.  People who have hangnails or injured fingertips.  People who get manicures.  People who have diabetes.  What are the signs or symptoms? Symptoms of this condition include:  Redness and swelling of the skin near the nail.  Tenderness around the nail when you touch the area.  Pus-filled bumps under the cuticle. The cuticle is the skin at the base or sides of the nail.  Fluid or pus under the nail.  Throbbing pain in the area.  How is this diagnosed? This condition is usually diagnosed with a physical exam. In some cases, a sample of pus may be taken from an abscess to be tested in a lab. This can help to determine what type of bacteria or fungi is  causing the condition. How is this treated? Treatment for this condition depends on the cause and severity of the condition. If the condition is mild, it may clear up on its own in a few days. Your health care provider may recommend soaking the affected area in warm water a few times a day. When treatment is needed, the options may include:  Antibiotic medicine, if the condition is caused by a bacterial infection.  Antifungal medicine, if the condition is caused by a fungal infection.  Incision and drainage, if an abscess is present. In this procedure, the health care provider will cut open the abscess so the pus can drain out.  Follow these instructions at home:  Soak the affected area in warm water if directed to do so by your health care provider. You may be told to do this for 20 minutes, 2-3 times a day. Keep the area dry in between soakings.  Take medicines only as directed by your health care provider.  If you were prescribed an antibiotic medicine, finish all of it even if you start to feel better.  Keep the affected area clean.  Do not try to drain a fluid-filled bump yourself.  If you will be washing dishes or performing other tasks that require your hands to get wet, wear rubber gloves. You should also wear gloves if your hands might come in contact with irritating substances, such as cleaners or chemicals.  Follow  your health care provider's instructions about: ? Wound care. ? Bandage (dressing) changes and removal. Contact a health care provider if:  Your symptoms get worse or do not improve with treatment.  You have a fever or chills.  You have redness spreading from the affected area.  You have continued or increased fluid, blood, or pus coming from the affected area.  Your finger or knuckle becomes swollen or is difficult to move. This information is not intended to replace advice given to you by your health care provider. Make sure you discuss any questions you  have with your health care provider. Document Released: 06/13/2000 Document Revised: 05/26/2015 Document Reviewed: 11/25/2013 Elsevier Interactive Patient Education  [employer].

## 2017-02-28 ENCOUNTER — Other Ambulatory Visit: Payer: Self-pay | Admitting: Primary Care

## 2017-02-28 DIAGNOSIS — G4700 Insomnia, unspecified: Secondary | ICD-10-CM

## 2017-03-01 NOTE — Telephone Encounter (Signed)
Refill sent to pharmacy. Due for general follow up or CPE visit in April, please schedule. Either are fine.

## 2017-03-01 NOTE — Telephone Encounter (Signed)
Ok to refill? Electronically refill request for zolpidem (AMBIEN) 10 MG tablet  Last prescribed on 12/01/2016. Last seen on 12/07/2016

## 2017-03-08 NOTE — Telephone Encounter (Signed)
Noted. Letter was send.

## 2017-04-01 ENCOUNTER — Other Ambulatory Visit: Payer: Self-pay | Admitting: Primary Care

## 2017-04-01 DIAGNOSIS — I10 Essential (primary) hypertension: Secondary | ICD-10-CM

## 2017-04-01 DIAGNOSIS — F4323 Adjustment disorder with mixed anxiety and depressed mood: Secondary | ICD-10-CM

## 2017-04-22 ENCOUNTER — Other Ambulatory Visit: Payer: Self-pay | Admitting: Primary Care

## 2017-04-22 DIAGNOSIS — G4700 Insomnia, unspecified: Secondary | ICD-10-CM

## 2017-04-22 DIAGNOSIS — F4323 Adjustment disorder with mixed anxiety and depressed mood: Secondary | ICD-10-CM

## 2017-04-22 NOTE — Telephone Encounter (Signed)
Copied from [facility] [facility] [id number]. Topic: Quick Communication - See Telephone Encounter >> Apr 22, 2017  2:54 PM Ether Griffins B wrote: CRM for notification. See Telephone encounter for: 04/22/17.  All 3 meds where called in beginning of April but pt didn't get to pharmacy in time and they put them back and was able to refill all but the Ambien and need a new order called in for that due to the pt not picking up the medication and it was filled.  Please send to [id number] - [place], [address] AT [address].   Also the Paxil directions need to updated . Pt states she takes 2 a day and has been for the past year. And is almost out of the medication due to only 30 being called in and will need a refill on that.

## 2017-04-23 MED ORDER — PAROXETINE HCL 20 MG PO TABS: 40.0000 mg | ORAL_TABLET | Freq: Every day | ORAL | 0 refills | Status: DC

## 2017-04-23 NOTE — Telephone Encounter (Signed)
I have called [facility] regarding Ambien. They stated that they only gave patient partial of this Rx back in March, 30 days supply. They close out the rest of the Rx. Will need a new Rx for patient.  Also already made the correction for the Paxil (only gave 30 days supply) and spoken to patient. Tried to schedule follow up or CPE. She stated that she is sick right and will call back in a few days to schedule.

## 2017-04-24 MED ORDER — ZOLPIDEM TARTRATE 10 MG PO TABS: 10.0000 mg | ORAL_TABLET | Freq: Every evening | ORAL | 0 refills | Status: DC | PRN

## 2017-04-24 NOTE — Telephone Encounter (Signed)
Will allot 30 day supply until she can get in for follow up. Unfortunately we cannot provide any additional refills until she' scheduled.

## 2017-05-03 ENCOUNTER — Other Ambulatory Visit: Payer: Self-pay | Admitting: Primary Care

## 2017-05-03 DIAGNOSIS — I10 Essential (primary) hypertension: Secondary | ICD-10-CM

## 2017-05-20 ENCOUNTER — Encounter: Payer: Self-pay | Admitting: Primary Care

## 2017-05-20 ENCOUNTER — Ambulatory Visit (INDEPENDENT_AMBULATORY_CARE_PROVIDER_SITE_OTHER): Payer: 59 | Admitting: Primary Care

## 2017-05-20 VITALS — BP 142/70 | HR 78 | Temp 98.0°F | Ht 59.0 in | Wt 109.8 lb

## 2017-05-20 DIAGNOSIS — I10 Essential (primary) hypertension: Secondary | ICD-10-CM | POA: Diagnosis not present

## 2017-05-20 DIAGNOSIS — I739 Peripheral vascular disease, unspecified: Secondary | ICD-10-CM | POA: Diagnosis not present

## 2017-05-20 DIAGNOSIS — G4700 Insomnia, unspecified: Secondary | ICD-10-CM

## 2017-05-20 DIAGNOSIS — E782 Mixed hyperlipidemia: Secondary | ICD-10-CM

## 2017-05-20 DIAGNOSIS — J449 Chronic obstructive pulmonary disease, unspecified: Secondary | ICD-10-CM

## 2017-05-20 DIAGNOSIS — J069 Acute upper respiratory infection, unspecified: Secondary | ICD-10-CM

## 2017-05-20 DIAGNOSIS — F4323 Adjustment disorder with mixed anxiety and depressed mood: Secondary | ICD-10-CM | POA: Diagnosis not present

## 2017-05-20 LAB — LIPID PANEL
CHOL/HDL RATIO: 4
CHOLESTEROL: 136 mg/dL (ref 0–200)
HDL: 37.1 mg/dL — ABNORMAL LOW (ref >39.00)
LDL CALC: 88 mg/dL (ref 0–99)
NONHDL: 98.58
Triglycerides: 51 mg/dL (ref 0.0–149.0)
VLDL: 10.2 mg/dL (ref 0.0–40.0)

## 2017-05-20 LAB — COMPREHENSIVE METABOLIC PANEL
ALBUMIN: 4 g/dL (ref 3.5–5.2)
ALT: 7 U/L (ref 0–35)
AST: 9 U/L (ref 0–37)
Alkaline Phosphatase: 101 U/L (ref 39–117)
BUN: 14 mg/dL (ref 6–23)
CHLORIDE: 102 meq/L (ref 96–112)
CO2: 25 mEq/L (ref 19–32)
CREATININE: 0.83 mg/dL (ref 0.40–1.20)
Calcium: 9.3 mg/dL (ref 8.4–10.5)
GFR: 74.84 mL/min (ref >60.00)
GLUCOSE: 101 mg/dL — AB (ref 70–99)
POTASSIUM: 4.6 meq/L (ref 3.5–5.1)
SODIUM: 137 meq/L (ref 135–145)
Total Bilirubin: 0.3 mg/dL (ref 0.2–1.2)
Total Protein: 6.9 g/dL (ref 6.0–8.3)

## 2017-05-20 MED ORDER — ZOLPIDEM TARTRATE 10 MG PO TABS: 10.0000 mg | ORAL_TABLET | Freq: Every evening | ORAL | 0 refills | Status: DC | PRN

## 2017-05-20 MED ORDER — AMLODIPINE BESYLATE 10 MG PO TABS: 10.0000 mg | ORAL_TABLET | Freq: Every day | ORAL | 3 refills | Status: DC

## 2017-05-20 MED ORDER — PAROXETINE HCL 20 MG PO TABS: 40.0000 mg | ORAL_TABLET | Freq: Every day | ORAL | 3 refills | Status: DC

## 2017-05-20 MED ORDER — ALBUTEROL SULFATE HFA 108 (90 BASE) MCG/ACT IN AERS: 2.0000 | INHALATION_SPRAY | RESPIRATORY_TRACT | 0 refills | Status: DC | PRN

## 2017-05-20 NOTE — Assessment & Plan Note (Addendum)
Above goal in the office today, also on home readings. Increase Amlodipine to 10 mg, continue Lopressor. BMP pending. Will call for readings in 2 weeks.

## 2017-05-20 NOTE — Progress Notes (Signed)
Subjective:    Patient ID: [name] [name], female    DOB: [birth year], 57 y.o.   MRN: [id number]  HPI  [name] [name] is a 57 year old female who presents today for follow up.  1) Essential Hypertension/Abdominal Atherosclerosis of Abdominal Aorta: Currently managed on Amlodipine 5 mg and metoprolol tartrate 25 mg BID.   She checks her BP sometimes at home and gets readings of 150's/80's-90's. She is compliant to her medications daily. She denies chest pain, dizziness, headaches, shortness of breath, extremity edema.  She has noticed cramping/pain to the left lower extremity, mostly posterior, with ambulation. This has been present since January 2019, will occur with short distance walking, grocery store walking, walking around the house. This will improve with rest. She's smoking one pack of cigarettes daily, has been smoking for 40 years. She denies pain to the right leg.   BP Readings from Last 3 Encounters:  05/20/17 (!) 142/70  12/07/16 120/70  05/31/16 140/80   2) Hyperlipidemia: Currently not managed on medication. Due for lipid recheck today. She was once managed on a statin in the past, came off years ago.   3) Insomnia: Currently managed on Zolpidem 10 mg. She is taking this nightly with improvement. She denies vivid dreams, nightmares, suicidal thoughts.   4) Depression: Currently managed on paroxetine 40 mg. Overall feels very well managed and is needing refills. Denies SI/HI.  5) COPD: Currently managed on albuterol HFA inhaler for which she uses infrequently. Did have to use it recently during a cold. She continues to smoke 1 PPD of cigarettes.   Review of Systems  Eyes: Negative for visual disturbance.  Respiratory: Negative for shortness of breath.   Cardiovascular: Negative for chest pain.  Musculoskeletal: Positive for myalgias.  Skin: Negative for color change and wound.  Neurological: Negative for dizziness and headaches.  Psychiatric/Behavioral: Negative for sleep  disturbance and suicidal ideas.       Past Medical History:  Diagnosis Date  . Atypical chest pain   . Back pain    chronic  . Chronic insomnia   . Depression   . Hyperlipidemia   . Hypertension   . PVD (peripheral vascular disease) ([place])    segmental femeropoliteal disease  . Tobacco abuse      Social History   Socioeconomic History  . Marital status: Divorced    Spouse name: Not on file  . Number of children: Not on file  . Years of education: Not on file  . Highest education level: Not on file  Occupational History  . Not on file  Social Needs  . Financial resource strain: Not on file  . Food insecurity:    Worry: Not on file    Inability: Not on file  . Transportation needs:    Medical: Not on file    Non-medical: Not on file  Tobacco Use  . Smoking status: Current Every Day Smoker    Packs/day: 0.50  . Smokeless tobacco: Never Used  Substance and Sexual Activity  . Alcohol use: No  . Drug use: Not on file  . Sexual activity: Not on file  Lifestyle  . Physical activity:    Days per week: Not on file    Minutes per session: Not on file  . Stress: Not on file  Relationships  . Social connections:    Talks on phone: Not on file    Gets together: Not on file    Attends religious service: Not on file  Active member of club or organization: Not on file    Attends meetings of clubs or organizations: Not on file    Relationship status: Not on file  . Intimate partner violence:    Fear of current or ex partner: Not on file    Emotionally abused: Not on file    Physically abused: Not on file    Forced sexual activity: Not on file  Other Topics Concern  . Not on file  Social History Narrative   Divorced with 4 children    Current Smoker 1ppd    No alcohol   No illicit drug use      Occupation: Tax preparation        Past Surgical History:  Procedure Laterality Date  . BUNIONECTOMY  1996   bilateral  . CARDIAC CATHETERIZATION N/A 11/01/2014    Procedure: Left Heart Cath and Coronary Angiography;  Surgeon: Dionisio David, MD;  Location: [facility];  Service: Cardiovascular;  Laterality: N/A;  . CERVICAL DISCECTOMY  06/21/2006    and fusion   . CESAREAN SECTION  1990  . ENDOMETRIAL ABLATION  June 2010  . NASAL SINUS SURGERY  1999  . TUBAL LIGATION    . WRIST SURGERY  2005   right wrist    Family History  Problem Relation Age of Onset  . Alcohol abuse Father        deceased age 41  . Emphysema Mother        deceased age 55  . ADD / ADHD Son     No Known Allergies  Current Outpatient Medications on File Prior to Visit  Medication Sig Dispense Refill  . hydrOXYzine (ATARAX/VISTARIL) 25 MG tablet TAKE 1 TABLET(25 MG) BY MOUTH TWICE DAILY AS NEEDED FOR ANXIETY 30 tablet 0  . metoprolol tartrate (LOPRESSOR) 25 MG tablet Take 1 tablet (25 mg total) by mouth 2 (two) times daily. 60 tablet 11   No current facility-administered medications on file prior to visit.     BP (!) 142/70   Pulse 78   Temp 98 F (36.7 C) (Oral)   Ht 4\' 11"  (1.499 m)   Wt 109 lb 12 oz (49.8 kg)   SpO2 97%   BMI 22.17 kg/m    Objective:   Physical Exam  Constitutional: She appears well-nourished.  Cardiovascular: Normal rate and regular rhythm.  Pulses:      Dorsalis pedis pulses are 2+ on the right side, and 1+ on the left side.       Posterior tibial pulses are 2+ on the right side, and 1+ on the left side.  No lower extremity edema  Pulmonary/Chest: Effort normal and breath sounds normal.  Musculoskeletal: Normal range of motion.  Skin: Skin is warm and dry. No erythema.          Assessment & Plan:

## 2017-05-20 NOTE — Assessment & Plan Note (Signed)
Repeat lipid panel pending. Consider re-initiation of statin given history of abdominal aorta atherosclerosis.

## 2017-05-20 NOTE — Patient Instructions (Addendum)
Stop by the lab prior to leaving today. I will notify you of your results once received.   Stop by the front desk and speak with either Rosaria Ferries or Anastasiya regarding your blood flow tests.  We've increased your amlodipine from 5 mg to 10 mg. I sent a new prescription to the pharmacy.  Start monitoring your blood pressure daily, around the same time of day, for the next 2 weeks.  Ensure that you have rested for 30 minutes prior to checking your blood pressure. Record your readings and send them to me on My Chart.  It was a pleasure to see you today!

## 2017-05-20 NOTE — Assessment & Plan Note (Signed)
Doing well on Ambien, continue same. Refills sent to pharmacy.

## 2017-05-20 NOTE — Assessment & Plan Note (Signed)
Using albuterol inhaler infrequently, refills provided today.

## 2017-05-20 NOTE — Assessment & Plan Note (Signed)
Symptoms representative of intermittent claudication. Check ABI's given reduced pedal pulses to left lower extremity, history of hyperlipidemia, history of tobacco abuse.

## 2017-05-20 NOTE — Assessment & Plan Note (Signed)
Doing well on increased doses of Paxil 40 mg, continue same. Refills sent to pharmacy.

## 2017-05-22 ENCOUNTER — Ambulatory Visit (HOSPITAL_COMMUNITY)
Admission: RE | Admit: 2017-05-22 | Discharge: 2017-05-22 | Disposition: A | Discharge status: Home / Self Care | Payer: 59 | Source: Ambulatory Visit | Attending: Cardiovascular Disease | Admitting: Cardiovascular Disease

## 2017-05-22 DIAGNOSIS — I739 Peripheral vascular disease, unspecified: Secondary | ICD-10-CM | POA: Diagnosis not present

## 2017-05-23 ENCOUNTER — Telehealth: Payer: Self-pay | Admitting: *Deleted

## 2017-05-23 ENCOUNTER — Other Ambulatory Visit: Payer: Self-pay | Admitting: Primary Care

## 2017-05-23 DIAGNOSIS — R6889 Other general symptoms and signs: Secondary | ICD-10-CM

## 2017-05-23 DIAGNOSIS — I739 Peripheral vascular disease, unspecified: Secondary | ICD-10-CM

## 2017-05-23 NOTE — Telephone Encounter (Signed)
Copied from [place] [id number]. Topic: Inquiry >> May 23, 2017  2:24 PM Aurelio Brash B wrote: Reason for CRM:PT states she had doppler yesterday and blockage was found  in left leg,  she wants to know what the next step is

## 2017-05-23 NOTE — Telephone Encounter (Signed)
Done. [name] [name] to patient and the result note to [place].

## 2017-05-23 NOTE — Telephone Encounter (Signed)
Please notify patient of my comments.

## 2017-05-29 ENCOUNTER — Encounter (INDEPENDENT_AMBULATORY_CARE_PROVIDER_SITE_OTHER): Payer: Self-pay

## 2017-06-01 ENCOUNTER — Encounter: Payer: Self-pay | Admitting: Primary Care

## 2017-06-12 ENCOUNTER — Emergency Department: Payer: 59

## 2017-06-12 ENCOUNTER — Other Ambulatory Visit: Payer: Self-pay

## 2017-06-12 ENCOUNTER — Emergency Department
Admission: EM | Admit: 2017-06-12 | Discharge: 2017-06-12 | Disposition: A | Discharge status: Home / Self Care | Payer: 59 | Attending: Emergency Medicine | Admitting: Emergency Medicine

## 2017-06-12 DIAGNOSIS — M25511 Pain in right shoulder: Secondary | ICD-10-CM | POA: Diagnosis not present

## 2017-06-12 DIAGNOSIS — F17210 Nicotine dependence, cigarettes, uncomplicated: Secondary | ICD-10-CM | POA: Insufficient documentation

## 2017-06-12 DIAGNOSIS — I10 Essential (primary) hypertension: Secondary | ICD-10-CM | POA: Insufficient documentation

## 2017-06-12 DIAGNOSIS — Z79899 Other long term (current) drug therapy: Secondary | ICD-10-CM | POA: Insufficient documentation

## 2017-06-12 DIAGNOSIS — J449 Chronic obstructive pulmonary disease, unspecified: Secondary | ICD-10-CM | POA: Insufficient documentation

## 2017-06-12 DIAGNOSIS — R0789 Other chest pain: Secondary | ICD-10-CM | POA: Insufficient documentation

## 2017-06-12 DIAGNOSIS — I700 Atherosclerosis of aorta: Secondary | ICD-10-CM | POA: Diagnosis not present

## 2017-06-12 DIAGNOSIS — M79662 Pain in left lower leg: Secondary | ICD-10-CM | POA: Diagnosis not present

## 2017-06-12 LAB — COMPREHENSIVE METABOLIC PANEL
ALBUMIN: 4.3 g/dL (ref 3.5–5.0)
ALT: 12 U/L — AB (ref 14–54)
AST: 17 U/L (ref 15–41)
Alkaline Phosphatase: 114 U/L (ref 38–126)
Anion gap: 11 (ref 5–15)
BUN: 12 mg/dL (ref 6–20)
CHLORIDE: 102 mmol/L (ref 101–111)
CO2: 24 mmol/L (ref 22–32)
CREATININE: 0.89 mg/dL (ref 0.44–1.00)
Calcium: 9.6 mg/dL (ref 8.9–10.3)
GFR calc Af Amer: >60 mL/min (ref >60)
GFR calc non Af Amer: >60 mL/min (ref >60)
GLUCOSE: 97 mg/dL (ref 65–99)
Potassium: 4.3 mmol/L (ref 3.5–5.1)
SODIUM: 137 mmol/L (ref 135–145)
Total Bilirubin: 0.6 mg/dL (ref 0.3–1.2)
Total Protein: 7.4 g/dL (ref 6.5–8.1)

## 2017-06-12 LAB — CBC WITH DIFFERENTIAL/PLATELET
Basophils Absolute: 0 10*3/uL (ref 0–0.1)
Basophils Relative: 0 %
EOS ABS: 0.6 10*3/uL (ref 0–0.7)
EOS PCT: 5 %
HCT: 40.1 % (ref 35.0–47.0)
HEMOGLOBIN: 14.3 g/dL (ref 12.0–16.0)
LYMPHS ABS: 3 10*3/uL (ref 1.0–3.6)
Lymphocytes Relative: 26 %
MCH: 31.8 pg (ref 26.0–34.0)
MCHC: 35.6 g/dL (ref 32.0–36.0)
MCV: 89.2 fL (ref 80.0–100.0)
MONOS PCT: 5 %
Monocytes Absolute: 0.5 10*3/uL (ref 0.2–0.9)
Neutro Abs: 7.4 10*3/uL — ABNORMAL HIGH (ref 1.4–6.5)
Neutrophils Relative %: 64 %
PLATELETS: 236 10*3/uL (ref 150–440)
RBC: 4.49 MIL/uL (ref 3.80–5.20)
RDW: 14.5 % (ref 11.5–14.5)
WBC: 11.6 10*3/uL — AB (ref 3.6–11.0)

## 2017-06-12 LAB — PROTIME-INR
INR: 1
PROTHROMBIN TIME: 13.1 s (ref 11.4–15.2)

## 2017-06-12 MED ORDER — IOPAMIDOL (ISOVUE-370) INJECTION 76%
75.0000 mL | Freq: Once | INTRAVENOUS | Status: AC | PRN
  Administered 2017-06-12: 75 mL via INTRAVENOUS
  Filled 2017-06-12: qty 75

## 2017-06-12 MED ORDER — CYCLOBENZAPRINE HCL 5 MG PO TABS: 5.0000 mg | ORAL_TABLET | Freq: Three times a day (TID) | ORAL | 0 refills | Status: DC | PRN

## 2017-06-12 NOTE — ED Notes (Addendum)
Pt presents to ED with right sided scapular pain for over a week. Pt states pain is sharp in nature and does not get any relief with otc medications or rest. Denies chest pain. states she has noticed an increase in shortness of breath more recently. Limited ROM to affected extremity. Pain is not reproducible with movement. Tender with palpation. Pt states she was dx with blood clot in her left lower leg the end of May.  Not placed on any medications by her PCP.

## 2017-06-12 NOTE — ED Provider Notes (Signed)
[facility] Emergency Department Provider Note ____________________________________________  Time seen: Approximately 7:38 PM  I have reviewed the triage vital signs and the nursing notes.   HISTORY  Chief Complaint Shoulder Pain    HPI [name] [name] is a 57 y.o. female who presents to the emergency department for evaluation and treatment of right shoulder pain x 2 weeks. No injury. No relief with Aleeve or muscle relaxers. Constant stabbing pain.She reports a recent history of a "blockage" in her calf that was diagnosed at the end of May, but was not started on any new medications or blood thinners. She states that with this pain in her "shoulder blade" she has felt occasionally short of breath and has been using her inhaler more often.   Past Medical History:  Diagnosis Date  . Atypical chest pain   . Back pain    chronic  . Chronic insomnia   . Depression   . Hyperlipidemia   . Hypertension   . PVD (peripheral vascular disease) ([place])    segmental femeropoliteal disease  . Tobacco abuse     Patient Active Problem List   Diagnosis Date Noted  . Vitamin D deficiency 04/06/2016  . Rash and nonspecific skin eruption 04/06/2016  . Atherosclerosis of abdominal aorta ([place]) 07/16/2014  . Adjustment disorder with mixed anxiety and depressed mood 07/16/2014  . Cervical postlaminectomy syndrome 01/10/2012  . Hot flashes 11/02/2011  . Chronic fatigue 11/02/2011  . Chronic back pain 08/17/2011  . COPD (chronic obstructive pulmonary disease) ([place]) 04/21/2010  . ALLERGIC RHINITIS 07/01/2009  . STRESS INCONTINENCE 03/16/2008  . CLAUDICATION 07/10/2007  . LEG PAIN, BILATERAL 06/24/2007  . Hyperlipidemia 01/01/2007  . Anemia 01/01/2007  . TOBACCO ABUSE 01/01/2007  . INSOMNIA, CHRONIC 01/01/2007  . RESTLESS LEG SYNDROME 01/01/2007  . Essential hypertension 01/01/2007    Past Surgical History:  Procedure Laterality Date  . BUNIONECTOMY  1996   bilateral   . CARDIAC CATHETERIZATION N/A 11/01/2014   Procedure: Left Heart Cath and Coronary Angiography;  Surgeon: Dionisio David, MD;  Location: [facility] [facility];  Service: Cardiovascular;  Laterality: N/A;  . CERVICAL DISCECTOMY  06/21/2006    and fusion   . CESAREAN SECTION  1990  . ENDOMETRIAL ABLATION  June 2010  . NASAL SINUS SURGERY  1999  . TUBAL LIGATION    . WRIST SURGERY  2005   right wrist    Prior to Admission medications   Medication Sig Start Date End Date Taking? Authorizing Provider  albuterol (PROVENTIL HFA;VENTOLIN HFA) 108 (90 Base) MCG/ACT inhaler Inhale 2 puffs into the lungs every 4 (four) hours as needed for wheezing or shortness of breath. 05/20/17   Pleas Koch, NP  amLODipine (NORVASC) 10 MG tablet Take 1 tablet (10 mg total) by mouth daily. 05/20/17   Pleas Koch, NP  cyclobenzaprine (FLEXERIL) 5 MG tablet Take 1 tablet (5 mg total) by mouth 3 (three) times daily as needed for muscle spasms. 06/12/17   Mourad Cwikla, Johnette Abraham B, FNP  hydrOXYzine (ATARAX/VISTARIL) 25 MG tablet TAKE 1 TABLET(25 MG) BY MOUTH TWICE DAILY AS NEEDED FOR ANXIETY 06/10/16   Pleas Koch, NP  metoprolol tartrate (LOPRESSOR) 25 MG tablet Take 1 tablet (25 mg total) by mouth 2 (two) times daily. 09/15/15   Pleas Koch, NP  PARoxetine (PAXIL) 20 MG tablet Take 2 tablets (40 mg total) by mouth daily. 05/20/17   Pleas Koch, NP  zolpidem (AMBIEN) 10 MG tablet Take 1 tablet (10  mg total) by mouth at bedtime as needed. for sleep 05/20/17   Pleas Koch, NP    Allergies Patient has no known allergies.  Family History  Problem Relation Age of Onset  . Alcohol abuse Father        deceased age 73  . Emphysema Mother        deceased age 39  . ADD / ADHD Son     Social History Social History   Tobacco Use  . Smoking status: Current Every Day Smoker    Packs/day: 0.50  . Smokeless tobacco: Never Used  Substance Use Topics  . Alcohol use: No  . Drug use: Not on file     Review of Systems Constitutional: Negative for fever. Cardiovascular: Negative for chest pain. Respiratory: Negative for shortness of breath. Musculoskeletal: Positive for right scapular pain and left lower leg pain. Skin: Negative for rash or wound.  Neurological: Negative for decrease in sensation  ____________________________________________   PHYSICAL EXAM:  VITAL SIGNS: ED Triage Vitals  Enc Vitals Group     BP 06/12/17 1914 (!) 217/53     Pulse Rate 06/12/17 1914 80     Resp 06/12/17 1914 17     Temp 06/12/17 1914 98.1 F (36.7 C)     Temp Source 06/12/17 1914 Oral     SpO2 06/12/17 1914 98 %     Weight 06/12/17 1915 107 lb (48.5 kg)     Height 06/12/17 1915 4\' 11"  (1.499 m)     Head Circumference --      Peak Flow --      Pain Score 06/12/17 1915 9     Pain Loc --      Pain Edu? --      Excl. in [place]? --     Constitutional: Alert and oriented. Well appearing and in no acute distress. Eyes: Conjunctivae are clear without discharge or drainage Head: Atraumatic Neck: Supple Respiratory: No cough. Respirations are even and unlabored. Musculoskeletal: Full, active ROM of the right shoulder and left lower extremity demonstrated.  Neurologic: Motor and sensory function intact.  Skin: Areas of ecchymosis noted over forearms.  Psychiatric: Affect and behavior are appropriate.  ____________________________________________   LABS (all labs ordered are listed, but only abnormal results are displayed)  Labs Reviewed  COMPREHENSIVE METABOLIC PANEL - Abnormal; Notable for the following components:      Result Value   ALT 12 (*)    All other components within normal limits  CBC WITH DIFFERENTIAL/PLATELET - Abnormal; Notable for the following components:   WBC 11.6 (*)    Neutro Abs 7.4 (*)    All other components within normal limits  PROTIME-INR   ____________________________________________  RADIOLOGY  CT Chest negative for  PE. ____________________________________________   PROCEDURES  Procedures  ____________________________________________   INITIAL IMPRESSION / ASSESSMENT AND PLAN / ED COURSE  [name] [name] is a 57 y.o. who presents to the emergency department for evaluation and treatment of stabbing pain in the subscapular area on the right side. CT for PE was negative. She will be treated for musculoskeletal pain with flexeril 5mg  and encouraged to follow up with her PCP.  Medications  iopamidol (ISOVUE-370) 76 % injection 75 mL (75 mLs Intravenous Contrast Given 06/12/17 2033)    Pertinent labs & imaging results that were available during my care of the patient were reviewed by me and considered in my medical decision making (see chart for details).  _________________________________________   FINAL CLINICAL IMPRESSION(S) /  ED DIAGNOSES  Final diagnoses:  Acute pain of right shoulder    ED Discharge Orders        Ordered    cyclobenzaprine (FLEXERIL) 5 MG tablet  3 times daily PRN     06/12/17 2156       If controlled substance prescribed during this visit, 12 month history viewed on the [place] prior to issuing an initial prescription for Schedule II or III opiod.    Victorino Dike, FNP 06/12/17 2683    Arta Silence, MD 06/13/17 0009

## 2017-06-12 NOTE — ED Triage Notes (Addendum)
Pt arrives to ED via POV from home with c/o RIGHT shoulder pain x2 weeks. No falls or injury reported, CMS intact. Pt's BP noted to be elevated in Triage; pt reports h/x of HTN and reports compliance with prescribed HTN medications. Pt denies any c/o headache,dizziness, or BP-related concerns at this time.

## 2017-06-28 ENCOUNTER — Encounter

## 2017-07-24 ENCOUNTER — Ambulatory Visit (INDEPENDENT_AMBULATORY_CARE_PROVIDER_SITE_OTHER): Payer: 59 | Admitting: Vascular Surgery

## 2017-07-24 ENCOUNTER — Encounter (INDEPENDENT_AMBULATORY_CARE_PROVIDER_SITE_OTHER): Payer: Self-pay

## 2017-07-24 ENCOUNTER — Other Ambulatory Visit (INDEPENDENT_AMBULATORY_CARE_PROVIDER_SITE_OTHER): Payer: Self-pay | Admitting: Vascular Surgery

## 2017-07-24 ENCOUNTER — Other Ambulatory Visit (INDEPENDENT_AMBULATORY_CARE_PROVIDER_SITE_OTHER): Payer: 59

## 2017-07-24 ENCOUNTER — Encounter (INDEPENDENT_AMBULATORY_CARE_PROVIDER_SITE_OTHER): Payer: Self-pay | Admitting: Vascular Surgery

## 2017-07-24 VITALS — BP 152/71 | HR 63 | Resp 14 | Ht 59.0 in | Wt 110.0 lb

## 2017-07-24 DIAGNOSIS — R6889 Other general symptoms and signs: Secondary | ICD-10-CM

## 2017-07-24 DIAGNOSIS — F17200 Nicotine dependence, unspecified, uncomplicated: Secondary | ICD-10-CM | POA: Diagnosis not present

## 2017-07-24 DIAGNOSIS — I739 Peripheral vascular disease, unspecified: Secondary | ICD-10-CM | POA: Diagnosis not present

## 2017-07-24 DIAGNOSIS — I700 Atherosclerosis of aorta: Secondary | ICD-10-CM | POA: Diagnosis not present

## 2017-07-24 DIAGNOSIS — E785 Hyperlipidemia, unspecified: Secondary | ICD-10-CM | POA: Diagnosis not present

## 2017-07-24 NOTE — Progress Notes (Signed)
Subjective:    Patient ID: [name] [name], female    DOB: 08-01-1960, 57 y.o.   MRN: [id number] Chief Complaint  Patient presents with  . New Patient (Initial Visit)    ABI consult   Presents as a new patient referred by Loma Boston, NP for evaluation of left lower extremity claudication.  The patient was seen with her sister.  The patient notes progressively worsening claudication-like symptoms to the left calf over the last few weeks.  The patient denies any rest pain or ulcer formation to the bilateral lower extremity.  The patient notes that her discomfort has progressed to the point it has become lifestyle limiting which prompted her to seek medical attention.  The patient's claudication since has decreased.  A bilateral lower extremity arterial duplex conducted at [facility] cardiovascular imaging at [address] [facility] was notable for right: No evidence of significant right lower extremity arterial disease.  The right toe brachial index is abnormal.  Left: Resting ankle-brachial index indicates moderate left lower arterial disease.  The toe brachial index is abnormal.  The patient underwent a an aortoiliac or arterial duplex in our office today which was notable for right: Biphasic blood flow through the common and external iliac arteries.  Monophasic blood flow through the left external iliac artery left common iliac artery is not visualized distally.  The aorta has biphasic blood flow.  The patient denies any fever, nausea vomiting.  Review of Systems  Constitutional: Negative.   HENT: Negative.   Eyes: Negative.   Respiratory: Negative.   Cardiovascular:       Left lower extremity claudication  Gastrointestinal: Negative.   Endocrine: Negative.   Genitourinary: Negative.   Musculoskeletal: Negative.   Skin: Negative.   Allergic/Immunologic: Negative.   Neurological: Negative.   Hematological: Negative.   Psychiatric/Behavioral: Negative.       Objective:   Physical Exam    Constitutional: She is oriented to person, place, and time. She appears well-developed and well-nourished. No distress.  HENT:  Head: Normocephalic and atraumatic.  Right Ear: External ear normal.  Left Ear: External ear normal.  Eyes: Pupils are equal, round, and reactive to light. Conjunctivae and EOM are normal.  Neck: Normal range of motion.  Cardiovascular: Normal rate, regular rhythm, normal heart sounds and intact distal pulses.  Pulses:      Radial pulses are 2+ on the right side, and 2+ on the left side.       Dorsalis pedis pulses are 1+ on the right side.       Posterior tibial pulses are 1+ on the right side.  Hard to palpate pedal pulses to the left lower extremity however the bilateral feet are warm  Pulmonary/Chest: Effort normal and breath sounds normal.  Musculoskeletal: Normal range of motion. She exhibits no edema.  Neurological: She is alert and oriented to person, place, and time.  Skin: Skin is warm and dry. She is not diaphoretic.  Psychiatric: She has a normal mood and affect. Her behavior is normal. Judgment and thought content normal.  Vitals reviewed.  BP (!) 152/71 (BP Location: Right Arm, Patient Position: Sitting)   Pulse 63   Resp 14   Ht 4\' 11"  (1.499 m)   Wt 110 lb (49.9 kg)   BMI 22.22 kg/m   Past Medical History:  Diagnosis Date  . Atypical chest pain   . Back pain    chronic  . Chronic insomnia   . Depression   . Hyperlipidemia   .  Hypertension   . PVD (peripheral vascular disease) ([place])    segmental femeropoliteal disease  . Tobacco abuse    Social History   Socioeconomic History  . Marital status: Divorced    Spouse name: Not on file  . Number of children: Not on file  . Years of education: Not on file  . Highest education level: Not on file  Occupational History  . Not on file  Social Needs  . Financial resource strain: Not on file  . Food insecurity:    Worry: Not on file    Inability: Not on file  . Transportation  needs:    Medical: Not on file    Non-medical: Not on file  Tobacco Use  . Smoking status: Current Every Day Smoker    Packs/day: 0.50  . Smokeless tobacco: Never Used  Substance and Sexual Activity  . Alcohol use: No  . Drug use: Not on file  . Sexual activity: Not on file  Lifestyle  . Physical activity:    Days per week: Not on file    Minutes per session: Not on file  . Stress: Not on file  Relationships  . Social connections:    Talks on phone: Not on file    Gets together: Not on file    Attends religious service: Not on file    Active member of club or organization: Not on file    Attends meetings of clubs or organizations: Not on file    Relationship status: Not on file  . Intimate partner violence:    Fear of current or ex partner: Not on file    Emotionally abused: Not on file    Physically abused: Not on file    Forced sexual activity: Not on file  Other Topics Concern  . Not on file  Social History Narrative   Divorced with 4 children    Current Smoker 1ppd    No alcohol   No illicit drug use      Occupation: Tax preparation       Past Surgical History:  Procedure Laterality Date  . BUNIONECTOMY  1996   bilateral  . CARDIAC CATHETERIZATION N/A 11/01/2014   Procedure: Left Heart Cath and Coronary Angiography;  Surgeon: Dionisio David, MD;  Location: [facility];  Service: Cardiovascular;  Laterality: N/A;  . CERVICAL DISCECTOMY  06/21/2006    and fusion   . CESAREAN SECTION  1990  . ENDOMETRIAL ABLATION  June 2010  . NASAL SINUS SURGERY  1999  . TUBAL LIGATION    . WRIST SURGERY  2005   right wrist   Family History  Problem Relation Age of Onset  . Alcohol abuse Father        deceased age 31  . Emphysema Mother        deceased age 81  . ADD / ADHD Son    No Known Allergies     Assessment & Plan:  Presents as a new patient referred by Loma Boston, NP for evaluation of left lower extremity claudication.  The patient was seen with  her sister.  The patient notes progressively worsening claudication-like symptoms to the left calf over the last few weeks.  The patient denies any rest pain or ulcer formation to the bilateral lower extremity.  The patient notes that her discomfort has progressed to the point it has become lifestyle limiting which prompted her to seek medical attention.  The patient's claudication since has decreased.  A bilateral  lower extremity arterial duplex conducted at [facility] cardiovascular imaging at [address] [facility] was notable for right: No evidence of significant right lower extremity arterial disease.  The right toe brachial index is abnormal.  Left: Resting ankle-brachial index indicates moderate left lower arterial disease.  The toe brachial index is abnormal.  The patient underwent a an aortoiliac or arterial duplex in our office today which was notable for right: Biphasic blood flow through the common and external iliac arteries.  Monophasic blood flow through the left external iliac artery left common iliac artery is not visualized distally.  The aorta has biphasic blood flow.  The patient denies any fever, nausea vomiting.  1. PAD (peripheral artery disease) ([place]) - New Patient with worsening left lower extremity claudication-like symptoms. The symptoms have progressively worsened and have now become lifestyle limiting Patient with monophasic blood flow distally to the left lower extremity Recommend a left lower extremity angiogram with possible intervention and attempt to assess the patient's anatomy and contributing degree of peripheral artery disease.  If appropriate at that time an attempt to revascularize the leg can be made. Procedure, risks and benefits explained to the patient All questions answered Patient wishes to proceed  2. Atherosclerosis of abdominal aorta (HCC) - New As above  3. TOBACCO ABUSE - Stable We had a discussion for approximately five minutes regarding the absolute need  for smoking cessation due to the deleterious nature of tobacco on the vascular system. We discussed the tobacco use would diminish patency of any intervention, and likely significantly worsen progressio of disease. We discussed multiple agents for quitting including replacement therapy or medications to reduce cravings such as Chantix. The patient voices their understanding of the importance of smoking cessation.  4. Hyperlipidemia, unspecified hyperlipidemia type - Stable Would recommend aspirin and statin for medical management Encouraged good control as its slows the progression of atherosclerotic disease  Current Outpatient Medications on File Prior to Visit  Medication Sig Dispense Refill  . amLODipine (NORVASC) 10 MG tablet Take 1 tablet (10 mg total) by mouth daily. 90 tablet 3  . cyclobenzaprine (FLEXERIL) 5 MG tablet Take 1 tablet (5 mg total) by mouth 3 (three) times daily as needed for muscle spasms. 30 tablet 0  . hydrOXYzine (ATARAX/VISTARIL) 25 MG tablet TAKE 1 TABLET(25 MG) BY MOUTH TWICE DAILY AS NEEDED FOR ANXIETY 30 tablet 0  . metoprolol tartrate (LOPRESSOR) 25 MG tablet Take 1 tablet (25 mg total) by mouth 2 (two) times daily. 60 tablet 11  . PARoxetine (PAXIL) 20 MG tablet Take 2 tablets (40 mg total) by mouth daily. 180 tablet 3  . VENTOLIN HFA 108 (90 Base) MCG/ACT inhaler     . zolpidem (AMBIEN) 10 MG tablet      No current facility-administered medications on file prior to visit.    There are no Patient Instructions on file for this visit. No follow-ups on file.  Donavon Kimrey A [place] Lohr, PA-C

## 2017-07-26 ENCOUNTER — Emergency Department
Admission: EM | Admit: 2017-07-26 | Discharge: 2017-07-27 | Disposition: A | Discharge status: Home / Self Care | Payer: 59 | Attending: Emergency Medicine | Admitting: Emergency Medicine

## 2017-07-26 ENCOUNTER — Emergency Department: Payer: 59

## 2017-07-26 DIAGNOSIS — F17200 Nicotine dependence, unspecified, uncomplicated: Secondary | ICD-10-CM | POA: Insufficient documentation

## 2017-07-26 DIAGNOSIS — H20052 Hypopyon, left eye: Secondary | ICD-10-CM

## 2017-07-26 DIAGNOSIS — J449 Chronic obstructive pulmonary disease, unspecified: Secondary | ICD-10-CM | POA: Insufficient documentation

## 2017-07-26 DIAGNOSIS — Z79899 Other long term (current) drug therapy: Secondary | ICD-10-CM | POA: Insufficient documentation

## 2017-07-26 DIAGNOSIS — R05 Cough: Secondary | ICD-10-CM | POA: Diagnosis not present

## 2017-07-26 DIAGNOSIS — I10 Essential (primary) hypertension: Secondary | ICD-10-CM | POA: Insufficient documentation

## 2017-07-26 MED ORDER — PREDNISOLONE ACETATE 1 % OP SUSP: 1.0000 [drp] | OPHTHALMIC | 0 refills | Status: DC | PRN

## 2017-07-26 MED ORDER — ATROPINE SULFATE 1 % OP SOLN: 2.0000 [drp] | Freq: Four times a day (QID) | OPHTHALMIC | 12 refills | Status: DC

## 2017-07-26 MED ORDER — FLUORESCEIN SODIUM 1 MG OP STRP
1.0000 | ORAL_STRIP | Freq: Once | OPHTHALMIC | Status: AC
  Administered 2017-07-26: 1 via OPHTHALMIC
  Filled 2017-07-26: qty 1

## 2017-07-26 MED ORDER — TETRACAINE HCL 0.5 % OP SOLN
1.0000 [drp] | Freq: Once | OPHTHALMIC | Status: AC
  Administered 2017-07-26: 1 [drp] via OPHTHALMIC
  Filled 2017-07-26: qty 4

## 2017-07-26 MED ORDER — PREDNISOLONE ACETATE 1 % OP SUSP
2.0000 [drp] | Freq: Once | OPHTHALMIC | Status: AC
  Administered 2017-07-27: 2 [drp] via OPHTHALMIC
  Filled 2017-07-26: qty 5

## 2017-07-26 MED ORDER — ATROPINE SULFATE 1 % OP SOLN
2.0000 [drp] | Freq: Once | OPHTHALMIC | Status: AC
  Administered 2017-07-27: 2 [drp] via OPHTHALMIC
  Filled 2017-07-26: qty 2

## 2017-07-26 NOTE — Discharge Instructions (Addendum)
Have what is called a hypopyon, which is an inflammatory process in the eye.  Use the medications as prescribed, the eye doctors will see you first thing tomorrow morning at 830 at the eye clinic, please do not fill ago, the directions are above.  We have ordered a great deal blood work from you out of the lab, but we will not follow-up on this in the emergency room.  The eye doctors will follow up on it.  Please talk to them about it.  If you have severe pain, swelling, worsening vision or other complaints return to the emergency room.  Be careful trying to drive with a dilated left eye.

## 2017-07-26 NOTE — ED Notes (Signed)
Pt to eye room with MD Mcshane.

## 2017-07-26 NOTE — ED Provider Notes (Addendum)
[facility] Emergency Department Provider Note  ____________________________________________   I have reviewed the triage vital signs and the nursing notes. Where available I have reviewed prior notes and, if possible and indicated, outside hospital notes.    HISTORY  Chief Complaint Eye Pain    HPI [name] [name] is a 57 y.o. female who has a history of vascular pathology, hypertension hyperlipidemia, no history of eye surgeries, no history of contacts, does wear glasses, has never seen an ophthalmologist but does follow with an optometrist, states that off-and-on she gets eye pain which comes and goes for the last several years.  She states that the eye doctor is never been able to quite catch it and therefore no one knows what it is.  Over the last month she has had some clear tearing from the eye and mild discomfort, and then over the last week it seemed like her eye got somewhat red with no fevers, and then today she had decreased vision in the left eye which she woke up with.  She does not have any history she states of TB, no history of autoimmune disorder no history of ankylosing spondylitis, syphilis, rheumatoid arthritis etc.  She has no systemic illness no fevers no other complaints and she has minimal eye discomfort in the loss of vision in that eye which began this morning.  However it is the outcome of a gradually ongoing process for the last month.    Past Medical History:  Diagnosis Date  . Atypical chest pain   . Back pain    chronic  . Chronic insomnia   . Depression   . Hyperlipidemia   . Hypertension   . PVD (peripheral vascular disease) ([place])    segmental femeropoliteal disease  . Tobacco abuse     Patient Active Problem List   Diagnosis Date Noted  . Vitamin D deficiency 04/06/2016  . Rash and nonspecific skin eruption 04/06/2016  . Atherosclerosis of abdominal aorta ([place]) 07/16/2014  . Adjustment disorder with mixed anxiety and  depressed mood 07/16/2014  . Cervical postlaminectomy syndrome 01/10/2012  . Hot flashes 11/02/2011  . Chronic fatigue 11/02/2011  . Chronic back pain 08/17/2011  . COPD (chronic obstructive pulmonary disease) ([place]) 04/21/2010  . ALLERGIC RHINITIS 07/01/2009  . STRESS INCONTINENCE 03/16/2008  . PAD (peripheral artery disease) ([place]) 07/10/2007  . LEG PAIN, BILATERAL 06/24/2007  . Hyperlipidemia 01/01/2007  . Anemia 01/01/2007  . TOBACCO ABUSE 01/01/2007  . INSOMNIA, CHRONIC 01/01/2007  . RESTLESS LEG SYNDROME 01/01/2007  . Essential hypertension 01/01/2007    Past Surgical History:  Procedure Laterality Date  . BUNIONECTOMY  1996   bilateral  . CARDIAC CATHETERIZATION N/A 11/01/2014   Procedure: Left Heart Cath and Coronary Angiography;  Surgeon: Dionisio David, MD;  Location: [facility];  Service: Cardiovascular;  Laterality: N/A;  . CERVICAL DISCECTOMY  06/21/2006    and fusion   . CESAREAN SECTION  1990  . ENDOMETRIAL ABLATION  June 2010  . NASAL SINUS SURGERY  1999  . TUBAL LIGATION    . WRIST SURGERY  2005   right wrist    Prior to Admission medications   Medication Sig Start Date End Date Taking? Authorizing Provider  amLODipine (NORVASC) 10 MG tablet Take 1 tablet (10 mg total) by mouth daily. 05/20/17   Pleas Koch, NP  cyclobenzaprine (FLEXERIL) 5 MG tablet Take 1 tablet (5 mg total) by mouth 3 (three) times daily as needed for muscle spasms. 06/12/17  Triplett, Cari B, FNP  hydrOXYzine (ATARAX/VISTARIL) 25 MG tablet TAKE 1 TABLET(25 MG) BY MOUTH TWICE DAILY AS NEEDED FOR ANXIETY 06/10/16   Pleas Koch, NP  metoprolol tartrate (LOPRESSOR) 25 MG tablet Take 1 tablet (25 mg total) by mouth 2 (two) times daily. 09/15/15   Pleas Koch, NP  PARoxetine (PAXIL) 20 MG tablet Take 2 tablets (40 mg total) by mouth daily. 05/20/17   Pleas Koch, NP  VENTOLIN HFA 108 [phone] Base) MCG/ACT inhaler  05/20/17   [provider]  zolpidem (AMBIEN)  10 MG tablet  05/22/17   [provider]    Allergies Patient has no known allergies.  Family History  Problem Relation Age of Onset  . Alcohol abuse Father        deceased age 46  . Emphysema Mother        deceased age 78  . ADD / ADHD Son     Social History Social History   Tobacco Use  . Smoking status: Current Every Day Smoker    Packs/day: 0.50  . Smokeless tobacco: Never Used  Substance Use Topics  . Alcohol use: No  . Drug use: Not on file    Review of Systems Constitutional: No fever/chills Eyes: No visual changes. ENT: No sore throat. No stiff neck no neck pain Cardiovascular: Denies chest pain. Respiratory: Denies shortness of breath. Gastrointestinal:   no vomiting.  No diarrhea.  No constipation. Genitourinary: Negative for dysuria. Musculoskeletal: Negative lower extremity swelling Skin: Negative for rash. Neurological: Negative for severe headaches, focal weakness or numbness.   ____________________________________________   PHYSICAL EXAM:  VITAL SIGNS: ED Triage Vitals  Enc Vitals Group     BP 07/26/17 2101 (!) 183/56     Pulse Rate 07/26/17 2101 79     Resp 07/26/17 2101 18     Temp 07/26/17 2101 98.9 F (37.2 C)     Temp Source 07/26/17 2101 Oral     SpO2 07/26/17 2101 98 %     Weight 07/26/17 2102 110 lb (49.9 kg)     Height 07/26/17 2102 _0  (1.499 m)     Head Circumference --      Peak Flow --      Pain Score 07/26/17 2102 7     Pain Loc --      Pain Edu? --      Excl. in [place]? --     Constitutional: Alert and oriented. Well appearing and in no acute distress. Eyes: Her left sclera are injected, left pupil is minimally reactive, and somewhat constricted, the pupillary aperture is approximately 2 mm patient has no diet uptake under black light, there is no hyphema, pressures bilaterally are 14-16, the left eye however has a layering of white cells in the anterior chamber consistent with a hypopyon.  There is no tenderness  to palpation along the temporal region or induration of the artery Head: Atraumatic HEENT: No congestion/rhinnorhea. Mucous membranes are moist.  Oropharynx non-erythematous Neck:   Nontender with no meningismus, no masses, no stridor Cardiovascular: Normal rate, regular rhythm. Grossly normal heart sounds.  Good peripheral circulation. Respiratory: Normal respiratory effort.  No retractions. Lungs CTAB. Abdominal: Soft and nontender. No distention. No guarding no rebound Back:  There is no focal tenderness or step off.  there is no midline tenderness there are no lesions noted. there is no CVA tenderness Musculoskeletal: No lower extremity tenderness, no upper extremity tenderness. No joint effusions, no DVT signs strong distal pulses  no edema Neurologic:  Normal speech and language. No gross focal neurologic deficits are appreciated.  Skin:  Skin is warm, dry and intact. No rash noted. Psychiatric: Mood and affect are normal. Speech and behavior are normal.  ____________________________________________   LABS (all labs ordered are listed, but only abnormal results are displayed)  Labs Reviewed  CBC WITH DIFFERENTIAL/PLATELET  SEDIMENTATION RATE  ANTINUCLEAR ANTIBODIES, IFA  RHEUMATOID FACTOR  RPR  MISC [facility] TEST (SEND OUT)  MISC [facility] TEST (SEND OUT)  ANGIOTENSIN CONVERTING ENZYME  BASIC METABOLIC PANEL    Pertinent labs  results that were available during my care of the patient were reviewed by me and considered in my medical decision making (see chart for details). ____________________________________________  EKG  I personally interpreted any EKGs ordered by me or triage  ____________________________________________  RADIOLOGY  Pertinent labs & imaging results that were available during my care of the patient were reviewed by me and considered in my medical decision making (see chart for details). If possible, patient and/or family made aware of any abnormal  findings.  No results found. ____________________________________________    PROCEDURES  Procedure(s) performed: None  Procedures  Critical Care performed: None  ____________________________________________   INITIAL IMPRESSION / ASSESSMENT AND PLAN / ED COURSE  Pertinent labs & imaging results that were available during my care of the patient were reviewed by me and considered in my medical decision making (see chart for details).  Patient with a hypopyon, I did talk extensively to Dr. Wallace Going discussed all of the patient's findings and history including her visual field loss, minimally reactive pupil, and white cells layering in the anterior chamber.  Her lack of systemic symptoms, he recommended that we start her on atropine and prednisolone and he will see her tomorrow morning at 830 in his office.  There is no evidence of glaucoma, and no evidence of systemic infection, he feels this is reactive, we have started her on the drops here we will send her with a prescription for them.  Ophthalmology did request extensive blood work including HLA-B27 C ANCA ACE level Rh factor RPR QuantiFERON gold ANA chest x-ray CBC and a rheumatoid factor, all of these things are send out labs and none of them will be back.  I have advised that I will not follow-up on these labs as they are outside of my scope of practice, and he states that this will be the responsibility of him and his partners to follow-up on which I appreciate.  Accordingly, we will do her best to start the work-up here.  We cannot get QuantiFERON gold apparently given the timing of the blood draw but the rest we can at least get initiated and they will follow closely as an outpatient for it.  We will not keep her here for blood work as the patient is labs will be followed up by by ophthalmology.  Also requested a chest x-ray, patient is asymptomatic and we will of course get 1 to further their work-up.  Extensive return precautions and  follow-up given understood patient will be able to see the eye doctor in about 8 hours    ____________________________________________   FINAL CLINICAL IMPRESSION(S) / ED DIAGNOSES  Final diagnoses:  None      This chart was dictated using voice recognition software.  Despite best efforts to proofread,  errors can occur which can change meaning.      Schuyler Amor, MD 07/26/17 2338    Schuyler Amor, MD 07/30/17  [place]

## 2017-07-26 NOTE — ED Triage Notes (Signed)
Patient c/o redness and left eye irration for several weeks. Patient c/o blurred vision in left eye beginning today.

## 2017-07-27 DIAGNOSIS — H209 Unspecified iridocyclitis: Secondary | ICD-10-CM | POA: Diagnosis not present

## 2017-07-27 LAB — CBC WITH DIFFERENTIAL/PLATELET
BASOS PCT: 1 %
Basophils Absolute: 0.2 10*3/uL — ABNORMAL HIGH (ref 0–0.1)
EOS ABS: 0.3 10*3/uL (ref 0–0.7)
EOS PCT: 3 %
HEMATOCRIT: 38.9 % (ref 35.0–47.0)
Hemoglobin: 13.4 g/dL (ref 12.0–16.0)
Lymphocytes Relative: 30 %
Lymphs Abs: 3.8 10*3/uL — ABNORMAL HIGH (ref 1.0–3.6)
MCH: 30.8 pg (ref 26.0–34.0)
MCHC: 34.5 g/dL (ref 32.0–36.0)
MCV: 89.3 fL (ref 80.0–100.0)
MONO ABS: 0.7 10*3/uL (ref 0.2–0.9)
Monocytes Relative: 5 %
NEUTROS ABS: 7.8 10*3/uL — AB (ref 1.4–6.5)
Neutrophils Relative %: 61 %
PLATELETS: 248 10*3/uL (ref 150–440)
RBC: 4.36 MIL/uL (ref 3.80–5.20)
RDW: 14.1 % (ref 11.5–14.5)
WBC: 12.7 10*3/uL — ABNORMAL HIGH (ref 3.6–11.0)

## 2017-07-27 LAB — BASIC METABOLIC PANEL
Anion gap: 9 (ref 5–15)
BUN: 12 mg/dL (ref 6–20)
CALCIUM: 9.5 mg/dL (ref 8.9–10.3)
CO2: 25 mmol/L (ref 22–32)
Chloride: 104 mmol/L (ref 98–111)
Creatinine, Ser: 0.78 mg/dL (ref 0.44–1.00)
GFR calc Af Amer: >60 mL/min (ref >60)
Glucose, Bld: 100 mg/dL — ABNORMAL HIGH (ref 70–99)
POTASSIUM: 3.9 mmol/L (ref 3.5–5.1)
Sodium: 138 mmol/L (ref 135–145)

## 2017-07-27 LAB — SEDIMENTATION RATE: SED RATE: 17 mm/h (ref 0–30)

## 2017-07-28 LAB — RHEUMATOID FACTOR

## 2017-07-28 LAB — RPR: RPR: NONREACTIVE

## 2017-07-29 ENCOUNTER — Other Ambulatory Visit (INDEPENDENT_AMBULATORY_CARE_PROVIDER_SITE_OTHER): Payer: Self-pay | Admitting: Vascular Surgery

## 2017-07-29 DIAGNOSIS — H209 Unspecified iridocyclitis: Secondary | ICD-10-CM | POA: Diagnosis not present

## 2017-07-29 LAB — ANGIOTENSIN CONVERTING ENZYME: Angiotensin-Converting Enzyme: 48 U/L (ref 14–82)

## 2017-07-29 LAB — ANTINUCLEAR ANTIBODIES, IFA: ANTINUCLEAR ANTIBODIES, IFA: NEGATIVE

## 2017-07-30 LAB — MISC LABCORP TEST (SEND OUT): Labcorp test code: 162388

## 2017-07-31 LAB — MISC LABCORP TEST (SEND OUT): LABCORP TEST CODE: 6924

## 2017-08-02 ENCOUNTER — Inpatient Hospital Stay: Admission: RE | Admit: 2017-08-02 | Payer: 59 | Source: Ambulatory Visit

## 2017-08-04 MED ORDER — CEFAZOLIN SODIUM-DEXTROSE 2-4 GM/100ML-% IV SOLN
2.0000 g | Freq: Once | INTRAVENOUS | Status: AC
  Administered 2017-08-05: 2 g via INTRAVENOUS

## 2017-08-05 ENCOUNTER — Encounter
Admission: RE | Disposition: A | Discharge status: Home / Self Care | Payer: Self-pay | Source: Ambulatory Visit | Attending: Vascular Surgery

## 2017-08-05 ENCOUNTER — Encounter: Payer: Self-pay | Admitting: *Deleted

## 2017-08-05 ENCOUNTER — Ambulatory Visit
Admission: RE | Admit: 2017-08-05 | Discharge: 2017-08-05 | Disposition: A | Discharge status: Home / Self Care | Payer: 59 | Source: Ambulatory Visit | Attending: Vascular Surgery | Admitting: Vascular Surgery

## 2017-08-05 DIAGNOSIS — F17210 Nicotine dependence, cigarettes, uncomplicated: Secondary | ICD-10-CM | POA: Insufficient documentation

## 2017-08-05 DIAGNOSIS — I70212 Atherosclerosis of native arteries of extremities with intermittent claudication, left leg: Secondary | ICD-10-CM | POA: Insufficient documentation

## 2017-08-05 DIAGNOSIS — Z9889 Other specified postprocedural states: Secondary | ICD-10-CM | POA: Insufficient documentation

## 2017-08-05 DIAGNOSIS — F329 Major depressive disorder, single episode, unspecified: Secondary | ICD-10-CM | POA: Insufficient documentation

## 2017-08-05 DIAGNOSIS — I700 Atherosclerosis of aorta: Secondary | ICD-10-CM | POA: Insufficient documentation

## 2017-08-05 DIAGNOSIS — I10 Essential (primary) hypertension: Secondary | ICD-10-CM | POA: Insufficient documentation

## 2017-08-05 DIAGNOSIS — Z981 Arthrodesis status: Secondary | ICD-10-CM | POA: Insufficient documentation

## 2017-08-05 DIAGNOSIS — Z9851 Tubal ligation status: Secondary | ICD-10-CM | POA: Insufficient documentation

## 2017-08-05 DIAGNOSIS — F5104 Psychophysiologic insomnia: Secondary | ICD-10-CM | POA: Insufficient documentation

## 2017-08-05 DIAGNOSIS — Z79899 Other long term (current) drug therapy: Secondary | ICD-10-CM | POA: Insufficient documentation

## 2017-08-05 DIAGNOSIS — Z955 Presence of coronary angioplasty implant and graft: Secondary | ICD-10-CM | POA: Insufficient documentation

## 2017-08-05 DIAGNOSIS — E785 Hyperlipidemia, unspecified: Secondary | ICD-10-CM | POA: Insufficient documentation

## 2017-08-05 DIAGNOSIS — I70219 Atherosclerosis of native arteries of extremities with intermittent claudication, unspecified extremity: Secondary | ICD-10-CM

## 2017-08-05 HISTORY — DX: Congenital malformation of eye, unspecified: Q15.9

## 2017-08-05 HISTORY — DX: Gastro-esophageal reflux disease without esophagitis: K21.9

## 2017-08-05 HISTORY — PX: LOWER EXTREMITY ANGIOGRAPHY: CATH118251

## 2017-08-05 SURGERY — LOWER EXTREMITY ANGIOGRAPHY
Anesthesia: Moderate Sedation | Laterality: Left

## 2017-08-05 MED ORDER — HEPARIN (PORCINE) IN NACL 1000-0.9 UT/500ML-% IV SOLN
INTRAVENOUS | Status: AC
  Filled 2017-08-05: qty 1000

## 2017-08-05 MED ORDER — HYDROMORPHONE HCL 1 MG/ML IJ SOLN: 1.0000 mg | Freq: Once | INTRAMUSCULAR | Status: DC | PRN

## 2017-08-05 MED ORDER — LIDOCAINE-EPINEPHRINE (PF) 1 %-1:200000 IJ SOLN
INTRAMUSCULAR | Status: AC
  Filled 2017-08-05: qty 30

## 2017-08-05 MED ORDER — HYDRALAZINE HCL 20 MG/ML IJ SOLN: 5.0000 mg | INTRAMUSCULAR | Status: DC | PRN

## 2017-08-05 MED ORDER — SODIUM CHLORIDE 0.9 % IV SOLN
INTRAVENOUS | Status: DC
  Administered 2017-08-05: 12:00:00 via INTRAVENOUS

## 2017-08-05 MED ORDER — SODIUM CHLORIDE 0.9 % IV SOLN: 250.0000 mL | INTRAVENOUS | Status: DC | PRN

## 2017-08-05 MED ORDER — MIDAZOLAM HCL 2 MG/2ML IJ SOLN
INTRAMUSCULAR | Status: DC | PRN
  Administered 2017-08-05: 1 mg via INTRAVENOUS
  Administered 2017-08-05: 2 mg via INTRAVENOUS
  Administered 2017-08-05: 1 mg via INTRAVENOUS
  Administered 2017-08-05: 2 mg via INTRAVENOUS

## 2017-08-05 MED ORDER — SODIUM CHLORIDE 0.9 % IV SOLN: INTRAVENOUS | Status: DC

## 2017-08-05 MED ORDER — FENTANYL CITRATE (PF) 100 MCG/2ML IJ SOLN
INTRAMUSCULAR | Status: DC | PRN
  Administered 2017-08-05: 25 ug via INTRAVENOUS
  Administered 2017-08-05 (×2): 50 ug via INTRAVENOUS

## 2017-08-05 MED ORDER — ATORVASTATIN CALCIUM 10 MG PO TABS: 10.0000 mg | ORAL_TABLET | Freq: Every day | ORAL | 11 refills | Status: DC

## 2017-08-05 MED ORDER — DIPHENHYDRAMINE HCL 50 MG/ML IJ SOLN
INTRAMUSCULAR | Status: AC
  Filled 2017-08-05: qty 1

## 2017-08-05 MED ORDER — FENTANYL CITRATE (PF) 100 MCG/2ML IJ SOLN
INTRAMUSCULAR | Status: AC
  Filled 2017-08-05: qty 2

## 2017-08-05 MED ORDER — ATORVASTATIN CALCIUM 10 MG PO TABS: 10.0000 mg | ORAL_TABLET | Freq: Every day | ORAL | Status: DC

## 2017-08-05 MED ORDER — ASPIRIN EC 81 MG PO TBEC: 81.0000 mg | DELAYED_RELEASE_TABLET | Freq: Every day | ORAL | Status: DC

## 2017-08-05 MED ORDER — DIPHENHYDRAMINE HCL 50 MG/ML IJ SOLN
INTRAMUSCULAR | Status: DC | PRN
  Administered 2017-08-05: 25 mg via INTRAVENOUS

## 2017-08-05 MED ORDER — ASPIRIN EC 81 MG PO TBEC: 81.0000 mg | DELAYED_RELEASE_TABLET | Freq: Every day | ORAL | 2 refills | Status: DC

## 2017-08-05 MED ORDER — CLOPIDOGREL BISULFATE 75 MG PO TABS: 75.0000 mg | ORAL_TABLET | Freq: Every day | ORAL | 11 refills | Status: DC

## 2017-08-05 MED ORDER — HEPARIN SODIUM (PORCINE) 1000 UNIT/ML IJ SOLN
INTRAMUSCULAR | Status: AC
  Filled 2017-08-05: qty 1

## 2017-08-05 MED ORDER — CLOPIDOGREL BISULFATE 75 MG PO TABS: 75.0000 mg | ORAL_TABLET | Freq: Every day | ORAL | Status: DC

## 2017-08-05 MED ORDER — MIDAZOLAM HCL 5 MG/5ML IJ SOLN
INTRAMUSCULAR | Status: AC
  Filled 2017-08-05: qty 5

## 2017-08-05 MED ORDER — ACETAMINOPHEN 325 MG PO TABS: 650.0000 mg | ORAL_TABLET | ORAL | Status: DC | PRN

## 2017-08-05 MED ORDER — FAMOTIDINE 20 MG PO TABS: 40.0000 mg | ORAL_TABLET | ORAL | Status: DC | PRN

## 2017-08-05 MED ORDER — ONDANSETRON HCL 4 MG/2ML IJ SOLN: 4.0000 mg | Freq: Four times a day (QID) | INTRAMUSCULAR | Status: DC | PRN

## 2017-08-05 MED ORDER — SODIUM CHLORIDE 0.9% FLUSH: 3.0000 mL | INTRAVENOUS | Status: DC | PRN

## 2017-08-05 MED ORDER — ONDANSETRON HCL 4 MG/2ML IJ SOLN
4.0000 mg | Freq: Four times a day (QID) | INTRAMUSCULAR | Status: DC | PRN
Start: 2017-08-05 — End: 2017-08-05

## 2017-08-05 MED ORDER — HEPARIN SODIUM (PORCINE) 1000 UNIT/ML IJ SOLN
INTRAMUSCULAR | Status: DC | PRN
  Administered 2017-08-05: 2000 [IU] via INTRAVENOUS
  Administered 2017-08-05: 2500 [IU] via INTRAVENOUS

## 2017-08-05 MED ORDER — SODIUM CHLORIDE 0.9% FLUSH: 3.0000 mL | Freq: Two times a day (BID) | INTRAVENOUS | Status: DC

## 2017-08-05 MED ORDER — LABETALOL HCL 5 MG/ML IV SOLN: 10.0000 mg | INTRAVENOUS | Status: DC | PRN

## 2017-08-05 MED ORDER — METHYLPREDNISOLONE SODIUM SUCC 125 MG IJ SOLR: 125.0000 mg | INTRAMUSCULAR | Status: DC | PRN

## 2017-08-05 SURGICAL SUPPLY — 16 items
BALLN LUTONIX DCB 5X40X130 (BALLOONS) ×2
BALLOON LUTONIX DCB 5X40X130 (BALLOONS) IMPLANT
CATH PIG 70CM (CATHETERS) ×1 IMPLANT
CATH VS15FR (CATHETERS) ×1 IMPLANT
DEVICE PRESTO INFLATION (MISCELLANEOUS) ×1 IMPLANT
DEVICE STARCLOSE SE CLOSURE (Vascular Products) ×1 IMPLANT
GLIDECATH 4FR STR (CATHETERS) ×1 IMPLANT
GLIDEWIRE ADV .035X260CM (WIRE) ×1 IMPLANT
PACK ANGIOGRAPHY (CUSTOM PROCEDURE TRAY) ×2 IMPLANT
SHEATH BRITE TIP 5FRX11 (SHEATH) ×1 IMPLANT
SHEATH BRITE TIP 6FR X 23 (SHEATH) ×1 IMPLANT
STENT LIFESTENT 5F 6X40X135 (Permanent Stent) ×1 IMPLANT
STENT LIFESTREAM 6X37X80 (Permanent Stent) ×1 IMPLANT
SYR MEDRAD MARK V 150ML (SYRINGE) ×1 IMPLANT
TUBING CONTRAST HIGH PRESS 72 (TUBING) ×1 IMPLANT
WIRE J 3MM .035X145CM (WIRE) ×1 IMPLANT

## 2017-08-05 NOTE — Progress Notes (Signed)
Dr. Lucky Cowboy at bedside , speaking with pt. And sister re: procedure/results. Both verbalize understanding. Right groin clean, dry, intact without hematoma, edema, drainage, ecchymosis.

## 2017-08-05 NOTE — Op Note (Signed)
[facility]  Percutaneous Study/Intervention Procedural Note   Date of Surgery: 08/05/2017  Surgeon(s):Tyera Hansley    Assistants:none  Pre-operative Diagnosis: PAD with claudication left lower extremity  Post-operative diagnosis:  Same  Procedure(s) Performed:             1.  Ultrasound guidance for vascular access right femoral artery             2.  Catheter placement into left superficial femoral artery from right femoral approach             3.  Aortogram and selective left lower extremity angiogram             4.  Percutaneous transluminal angioplasty of left common and external iliac artery with 5 mm diameter Lutonix drug-coated angioplasty balloon             5.   Self-expanding stent placement to the distal left common iliac artery and proximal left external iliac artery with 6 mm diameter by 4 cm length life stent  6.  Covered stent placement to the left common iliac artery with 6 mm diameter by 37 mm length lifestream stent             7.  StarClose closure device right femoral artery  EBL: 5 cc  Contrast: 70 cc  Fluoro Time: 7.8 minutes  Moderate Conscious Sedation Time: approximately 30 minutes using 6 mg of Versed and 125 Mcg of Fentanyl              Indications:  Patient is a 57 y.o.female with lifestyle limiting claudication of the left lower extremity. The patient has noninvasive study showing moderate reduction in her left leg perfusion. The patient is brought in for angiography for further evaluation and potential treatment.  Risks and benefits are discussed and informed consent is obtained.   Procedure:  The patient was identified and appropriate procedural time out was performed.  The patient was then placed supine on the table and prepped and draped in the usual sterile fashion. Moderate conscious sedation was administered during a face to face encounter with the patient throughout the procedure with my supervision of the RN administering  medicines and monitoring the patient's vital signs, pulse oximetry, telemetry and mental status throughout from the start of the procedure until the patient was taken to the recovery room. Ultrasound was used to evaluate the right common femoral artery.  It was patent .  A digital ultrasound image was acquired.  A Seldinger needle was used to access the right common femoral artery under direct ultrasound guidance and a permanent image was performed.  A 0.035 J wire was advanced without resistance and a 5Fr sheath was placed.  Pigtail catheter was placed into the aorta and an AP aortogram was performed. This demonstrated greater than 70% left renal artery stenosis.  No obvious right renal artery stenosis.  The aorta was irregular and calcific but not stenotic.  The right common iliac artery had what appeared to be less than 50% stenosis.  The right hypogastric artery was occluded.  No stenosis in the right external iliac.  The left common iliac artery had heavily diseased vessel that occluded in the midsegment.  There was disease tracking down to the proximal external iliac artery and occlusion of the hypogastric artery.  The left external iliac artery then normalized a centimeter to beyond its origin without further stenosis distally. I then crossed the aortic bifurcation with a VS 1 catheter and  an advantage wire and navigated through the stenosis and occlusion in the left iliac vessels with the advantage wire and advanced to the left femoral head.  VS 1 catheter would not track, so I exchanged for a 65 cm glide catheter and advanced this into left common femoral artery.  Selective left lower extremity angiogram was then performed. This demonstrated mild disease of the SFA and popliteal artery with no significant stenoses.  The common femoral artery and profunda femoris artery were patent.  There appeared to be two-vessel runoff distally although the opacification was quite poor due to the inflow disease.  I then  advanced the glide catheter and the advantage wire well down into the superficial femoral artery to gain better purchase for treatment.  The glide catheter was then removed.  The patient was systemically heparinized and a 6 French 23 cm sheath was then placed over the Terumo Advantage wire. I then predilated the worst part of the lesion with a 5 mm diameter by 4 cm length Lutonix drug-coated angioplasty balloon in the distal common iliac artery and proximal external iliac artery.  This was inflated to 12 atm for 1 minute.  High-grade residual stenosis remained both in the proximal external iliac artery and distal common iliac artery with disease extending up into the more proximal common iliac artery as well.  There was some return of the hypogastric artery flow at this point so I elected to place a noncovered stent first in the external iliac artery and back into the last centimeter or so of the common iliac artery.  A 6 mm diameter by 40 mm length life stent was deployed in this location.  I then used a 6 mm diameter by 37 mm length covered lifestream stent in the left common iliac artery with minimal overlap with the noncovered stent in the distal common iliac artery.  These were dilated with 6 mm balloons with excellent angiographic completion result and less than 10% residual stenosis. I elected to terminate the procedure. The sheath was removed and StarClose closure device was deployed in the right femoral artery with excellent hemostatic result. The patient was taken to the recovery room in stable condition having tolerated the procedure well.  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  No obvious right renal artery stenosis.  The aorta was irregular and calcific but not stenotic.  The right common iliac artery had what appeared to be less than 50% stenosis.  The right hypogastric artery was occluded.  No stenosis in the right external iliac.  The left common iliac  artery had heavily diseased vessel that occluded in the midsegment.  There was disease tracking down to the proximal external iliac artery and occlusion of the hypogastric artery.  The left external iliac artery then normalized a centimeter to beyond its origin without further stenosis distally.             Left lower Extremity:  This demonstrated mild disease of the SFA and popliteal artery with no significant stenoses.  The common femoral artery and profunda femoris artery were patent.  There appeared to be two-vessel runoff distally although the opacification was quite poor due to the inflow disease   Disposition: Patient was taken to the recovery room in stable condition having tolerated the procedure well.  Complications: None  Leotis Pain 08/05/2017 2:33 PM   This note was created with Dragon Medical transcription  system. Any errors in dictation are purely unintentional.

## 2017-08-05 NOTE — H&P (Signed)
[facility] History & Physical Update  The patient was interviewed and re-examined.  The patient's previous History and Physical has been reviewed and is unchanged.  There is no change in the plan of care. We plan to proceed with the scheduled procedure.  Leotis Pain, MD  08/05/2017, 1:24 PM

## 2017-08-06 ENCOUNTER — Encounter: Payer: Self-pay | Admitting: Vascular Surgery

## 2017-08-16 ENCOUNTER — Other Ambulatory Visit: Payer: Self-pay | Admitting: Primary Care

## 2017-08-16 DIAGNOSIS — G4700 Insomnia, unspecified: Secondary | ICD-10-CM

## 2017-08-16 NOTE — Telephone Encounter (Signed)
Last filled 05/20/2017 #90 to [facility], not to pharmacy to be filled on or after 08/20/2017

## 2017-08-16 NOTE — Telephone Encounter (Signed)
Noted, refill sent to pharmacy. 

## 2017-10-15 ENCOUNTER — Other Ambulatory Visit (INDEPENDENT_AMBULATORY_CARE_PROVIDER_SITE_OTHER): Payer: Self-pay | Admitting: Vascular Surgery

## 2017-10-15 ENCOUNTER — Encounter (INDEPENDENT_AMBULATORY_CARE_PROVIDER_SITE_OTHER): Payer: Self-pay

## 2017-10-15 ENCOUNTER — Ambulatory Visit (INDEPENDENT_AMBULATORY_CARE_PROVIDER_SITE_OTHER): Payer: Self-pay | Admitting: Vascular Surgery

## 2017-10-15 DIAGNOSIS — I739 Peripheral vascular disease, unspecified: Secondary | ICD-10-CM

## 2017-10-23 ENCOUNTER — Encounter (INDEPENDENT_AMBULATORY_CARE_PROVIDER_SITE_OTHER): Payer: Self-pay | Admitting: Vascular Surgery

## 2017-11-12 ENCOUNTER — Other Ambulatory Visit: Payer: Self-pay | Admitting: Primary Care

## 2017-11-12 DIAGNOSIS — G4700 Insomnia, unspecified: Secondary | ICD-10-CM

## 2017-11-13 NOTE — Telephone Encounter (Signed)
Last prescribed on 08/16/2017 Last office visit on 05/20/2017

## 2017-11-13 NOTE — Telephone Encounter (Signed)
Noted, refill sent to pharmacy. 

## 2017-11-30 ENCOUNTER — Other Ambulatory Visit: Payer: Self-pay | Admitting: Primary Care

## 2017-11-30 DIAGNOSIS — I10 Essential (primary) hypertension: Secondary | ICD-10-CM

## 2017-12-02 NOTE — Telephone Encounter (Signed)
Noted, refill sent to pharmacy. 

## 2017-12-02 NOTE — Telephone Encounter (Signed)
Last office visit 05/20/2017 for follow up/med refill.  Last refilled 09/15/2015 for #60 with 11 refills.  No future appointments.  Refill?

## 2018-02-04 ENCOUNTER — Other Ambulatory Visit: Payer: Self-pay | Admitting: Primary Care

## 2018-02-04 DIAGNOSIS — G4700 Insomnia, unspecified: Secondary | ICD-10-CM

## 2018-02-05 NOTE — Telephone Encounter (Signed)
Last prescribed on 11/13/2017 #90 with 0 refills.  Last office visit on 05/20/2017 for follow up. No future appointment

## 2018-02-05 NOTE — Telephone Encounter (Signed)
Noted, refill sent to pharmacy. 

## 2018-02-13 ENCOUNTER — Ambulatory Visit: Payer: 59 | Admitting: Primary Care

## 2018-02-17 ENCOUNTER — Encounter: Payer: Self-pay | Admitting: Primary Care

## 2018-02-17 ENCOUNTER — Ambulatory Visit: Payer: 59 | Admitting: Primary Care

## 2018-02-17 VITALS — BP 124/80 | HR 68 | Temp 98.0°F | Ht 59.0 in | Wt 112.5 lb

## 2018-02-17 DIAGNOSIS — B029 Zoster without complications: Secondary | ICD-10-CM | POA: Diagnosis not present

## 2018-02-17 DIAGNOSIS — J309 Allergic rhinitis, unspecified: Secondary | ICD-10-CM

## 2018-02-17 DIAGNOSIS — J3489 Other specified disorders of nose and nasal sinuses: Secondary | ICD-10-CM | POA: Diagnosis not present

## 2018-02-17 DIAGNOSIS — L409 Psoriasis, unspecified: Secondary | ICD-10-CM

## 2018-02-17 HISTORY — DX: Zoster without complications: B02.9

## 2018-02-17 MED ORDER — MUPIROCIN 2 % EX OINT: 1.0000 "application " | TOPICAL_OINTMENT | Freq: Two times a day (BID) | CUTANEOUS | 0 refills | Status: DC

## 2018-02-17 MED ORDER — LEVOCETIRIZINE DIHYDROCHLORIDE 5 MG PO TABS
5.0000 mg | ORAL_TABLET | Freq: Every evening | ORAL | 0 refills | Status: DC
Start: 2018-02-17 — End: 2018-07-30

## 2018-02-17 MED ORDER — FLUTICASONE PROPIONATE 50 MCG/ACT NA SUSP: 1.0000 | Freq: Two times a day (BID) | NASAL | 0 refills | Status: DC

## 2018-02-17 MED ORDER — VALACYCLOVIR HCL 1 G PO TABS: 1000.0000 mg | ORAL_TABLET | Freq: Three times a day (TID) | ORAL | 0 refills | Status: DC

## 2018-02-17 NOTE — Assessment & Plan Note (Signed)
Chronic, moderate on exam today. We will start with Flonase twice daily, Xyzal daily. Strongly advise she quit smoking. If no improvement consider Singulair versus allergy referral.

## 2018-02-17 NOTE — Patient Instructions (Addendum)
Start valacyclovir 1000 mg tablets. Take 1 tablet by mouth three times daily for 7 days for shingles.  Use the mupirocin ointment twice daily for one week.  Nasal Congestion/Ear Pressure/Sinus Pressure: Try using Flonase (fluticasone) nasal spray. Instill 1 spray in each nostril twice daily.   Start levocertirizine (Xyzal) 5 mg tablets for allergies. Take this at bedtime.  You will be contacted regarding your referral to dermatology.  Please let us know if you have not been contacted within one week.   Please notify me if no improvement in 2 weeks.  It was a pleasure to see you today!

## 2018-02-17 NOTE — Assessment & Plan Note (Addendum)
Chronic, diagnosed per dermatology in 2018. Referral placed to new dermatologist per patient request. Scabbing noted to nostrils bilaterally, prescription for mupirocin ointment sent to pharmacy.

## 2018-02-17 NOTE — Progress Notes (Signed)
Subjective:    Patient ID: [name] [name], female    DOB: 1960/05/04, 59 y.o.   MRN: [id number]  HPI  Ms is a Campton is a 58 year old female with a history of allergic rhinitis, COPD hypertension, psoriasis who presents today with multiple complaints.  1) Nasal Congestion: Present intermittently for the last 6 months. Also with rhinorrhea, psoriasis in her nose, nasal sores. She denies post nasal drip, new cough. She's tried taking Claritin intermittently, also benadryl. Last week she started a decongestant nasal spray without much improvement. She is smoking 1 pack of cigarettes daily. She has tried applying neosporin and Vaseline to her nose without improvement.   2) Rash: History of psoriasis. Six days ago she noticed a rash to her left lateral trunk, pain radiating around to the mid thoracic trunk with itching. She's not applied anything topical to her rash. She has been under some stress with her chronic respiratory symptoms.   Review of Systems  Constitutional: Negative for fever.  HENT: Positive for congestion and rhinorrhea. Negative for ear pain, postnasal drip, sinus pressure and sore throat.   Respiratory: Negative for cough and shortness of breath.   Skin: Positive for rash.  Allergic/Immunologic: Positive for environmental allergies.       Past Medical History:  Diagnosis Date  . Atypical chest pain   . Back pain    chronic  . Chronic insomnia   . Depression   . Eye abnormality    trouble seeing out of lt eye  . GERD (gastroesophageal reflux disease)   . Hyperlipidemia   . Hypertension   . PVD (peripheral vascular disease) ([place])    segmental femeropoliteal disease  . Tobacco abuse      Social History   Socioeconomic History  . Marital status: Divorced    Spouse name: Not on file  . Number of children: Not on file  . Years of education: Not on file  . Highest education level: Not on file  Occupational History  . Not on file  Social Needs  . Financial  resource strain: Not on file  . Food insecurity:    Worry: Not on file    Inability: Not on file  . Transportation needs:    Medical: Not on file    Non-medical: Not on file  Tobacco Use  . Smoking status: Current Every Day Smoker    Packs/day: 0.50  . Smokeless tobacco: Never Used  Substance and Sexual Activity  . Alcohol use: No  . Drug use: Not on file  . Sexual activity: Not on file  Lifestyle  . Physical activity:    Days per week: Not on file    Minutes per session: Not on file  . Stress: Not on file  Relationships  . Social connections:    Talks on phone: Not on file    Gets together: Not on file    Attends religious service: Not on file    Active member of club or organization: Not on file    Attends meetings of clubs or organizations: Not on file    Relationship status: Not on file  . Intimate partner violence:    Fear of current or ex partner: Not on file    Emotionally abused: Not on file    Physically abused: Not on file    Forced sexual activity: Not on file  Other Topics Concern  . Not on file  Social History Narrative   Divorced with 4 children  Current Smoker 1ppd    No alcohol   No illicit drug use      Occupation: Tax preparation        Past Surgical History:  Procedure Laterality Date  . BUNIONECTOMY  1996   bilateral  . CARDIAC CATHETERIZATION N/A 11/01/2014   Procedure: Left Heart Cath and Coronary Angiography;  Surgeon: Dionisio David, MD;  Location: [facility];  Service: Cardiovascular;  Laterality: N/A;  . CERVICAL DISCECTOMY  06/21/2006    and fusion   . CESAREAN SECTION  1990  . ENDOMETRIAL ABLATION  June 2010  . LOWER EXTREMITY ANGIOGRAPHY Left 08/05/2017   Procedure: LOWER EXTREMITY ANGIOGRAPHY;  Surgeon: Algernon Huxley, MD;  Location: [facility];  Service: Cardiovascular;  Laterality: Left;  . NASAL SINUS SURGERY  1999  . TUBAL LIGATION    . WRIST SURGERY  2005   right wrist    Family History  Problem  Relation Age of Onset  . Alcohol abuse Father        deceased age 17  . Emphysema Mother        deceased age 64  . ADD / ADHD Son     No Known Allergies  Current Outpatient Medications on File Prior to Visit  Medication Sig Dispense Refill  . amLODipine (NORVASC) 10 MG tablet Take 1 tablet (10 mg total) by mouth daily. (Patient taking differently: Take 10 mg by mouth daily at 3 pm. ) 90 tablet 3  . aspirin EC 81 MG tablet Take 1 tablet (81 mg total) by mouth daily. 150 tablet 2  . atorvastatin (LIPITOR) 10 MG tablet Take 1 tablet (10 mg total) by mouth daily. 30 tablet 11  . clopidogrel (PLAVIX) 75 MG tablet Take 1 tablet (75 mg total) by mouth daily. 30 tablet 11  . cyclobenzaprine (FLEXERIL) 5 MG tablet Take 1 tablet (5 mg total) by mouth 3 (three) times daily as needed for muscle spasms. 30 tablet 0  . famotidine (PEPCID) 20 MG tablet Take 20 mg by mouth daily as needed for heartburn or indigestion.    . metoprolol tartrate (LOPRESSOR) 25 MG tablet Take 1 tablet (25 mg total) by mouth 2 (two) times daily. For blood pressure. 180 tablet 1  . naproxen sodium (ALEVE) 220 MG tablet Take 440 mg by mouth daily as needed (pain).    Marland Kitchen PARoxetine (PAXIL) 20 MG tablet Take 2 tablets (40 mg total) by mouth daily. (Patient taking differently: Take 40 mg by mouth daily at 3 pm. ) 180 tablet 3  . VENTOLIN HFA 108 (90 Base) MCG/ACT inhaler Inhale 2 puffs into the lungs every 6 (six) hours as needed for wheezing or shortness of breath.     . zolpidem (AMBIEN) 10 MG tablet TAKE 1 TABLET BY MOUTH AT BEDTIME AS NEEDED FOR SLEEP 90 tablet 0  . atropine 1 % ophthalmic solution Place 2 drops into the left eye 4 (four) times daily. (Patient not taking: Reported on 02/17/2018) 2 mL 12  . hydrOXYzine (ATARAX/VISTARIL) 25 MG tablet TAKE 1 TABLET(25 MG) BY MOUTH TWICE DAILY AS NEEDED FOR ANXIETY (Patient not taking: Reported on 02/17/2018) 30 tablet 0  . prednisoLONE acetate (PRED FORTE) 1 % ophthalmic suspension  Place 1 drop into the left eye every hour as needed (while awake (NOT PRN< 1 hour while awake--no option for this in epic.)). (Patient not taking: Reported on 02/17/2018) 5 mL 0   No current facility-administered medications on file prior to visit.  BP 124/80   Pulse 68   Temp 98 F (36.7 C) (Oral)   Ht 4\' 11"  (1.499 m)   Wt 112 lb 8 oz (51 kg)   SpO2 98%   BMI 22.72 kg/m    Objective:   Physical Exam  Constitutional: She appears well-nourished. She does not appear ill.  HENT:  Right Ear: Tympanic membrane and ear canal normal.  Left Ear: Tympanic membrane and ear canal normal.  Nose: Mucosal edema present. Right sinus exhibits no maxillary sinus tenderness and no frontal sinus tenderness. Left sinus exhibits no maxillary sinus tenderness and no frontal sinus tenderness.  Mouth/Throat: Oropharynx is clear and moist.  Left nares with moderate edema and nearly closed.  Several scabs noted inside nostrils.  Neck: Neck supple.  Cardiovascular: Normal rate and regular rhythm.  Respiratory: Effort normal and breath sounds normal. She has no wheezes.  Skin: Skin is warm and dry. Rash noted. Rash is vesicular.     Vesicle type rash to left lateral trunk, mild scaling.  Follows dermatome.  No open vesicles.           Assessment & Plan:

## 2018-02-17 NOTE — Assessment & Plan Note (Signed)
Rash very characteristic of herpes zoster. Prescription for valacyclovir 1 g 3 times daily x7 days sent to pharmacy. Discussed precautions and home instructions.  All vesicles are closed at this time.

## 2018-04-01 DIAGNOSIS — I10 Essential (primary) hypertension: Secondary | ICD-10-CM

## 2018-04-02 MED ORDER — AMLODIPINE BESYLATE 10 MG PO TABS: 10.0000 mg | ORAL_TABLET | Freq: Every day | ORAL | 0 refills | Status: DC

## 2018-05-14 DIAGNOSIS — H35352 Cystoid macular degeneration, left eye: Secondary | ICD-10-CM | POA: Diagnosis not present

## 2018-05-14 DIAGNOSIS — H209 Unspecified iridocyclitis: Secondary | ICD-10-CM | POA: Diagnosis not present

## 2018-05-29 ENCOUNTER — Other Ambulatory Visit: Payer: Self-pay | Admitting: Primary Care

## 2018-05-29 DIAGNOSIS — I10 Essential (primary) hypertension: Secondary | ICD-10-CM

## 2018-05-29 DIAGNOSIS — G4700 Insomnia, unspecified: Secondary | ICD-10-CM

## 2018-05-29 NOTE — Telephone Encounter (Signed)
Patient is due for general follow up or CPE and will need this prior to any further refills. I will refill for 30 days for now. Please schedule.

## 2018-05-29 NOTE — Telephone Encounter (Signed)
Amlodipine 10 mg Last prescribed on 04/02/2018.   Zolpidem 10 mg Last prescribed on 02/05/2018 #90 with 0 refills   Last appointment on 02/17/2018. No future appointment

## 2018-05-30 NOTE — Telephone Encounter (Signed)
Message left for patient to return my call.  Also sent letter and MyChart message

## 2018-06-12 ENCOUNTER — Encounter: Payer: Self-pay | Admitting: Primary Care

## 2018-06-12 ENCOUNTER — Telehealth: Payer: Self-pay | Admitting: *Deleted

## 2018-06-12 ENCOUNTER — Other Ambulatory Visit: Payer: Self-pay

## 2018-06-12 ENCOUNTER — Ambulatory Visit (INDEPENDENT_AMBULATORY_CARE_PROVIDER_SITE_OTHER): Payer: 59 | Admitting: Primary Care

## 2018-06-12 VITALS — BP 128/74 | HR 78 | Temp 97.8°F | Ht 59.0 in | Wt 110.2 lb

## 2018-06-12 DIAGNOSIS — M545 Low back pain, unspecified: Secondary | ICD-10-CM

## 2018-06-12 DIAGNOSIS — G4700 Insomnia, unspecified: Secondary | ICD-10-CM

## 2018-06-12 DIAGNOSIS — Z23 Encounter for immunization: Secondary | ICD-10-CM

## 2018-06-12 DIAGNOSIS — E785 Hyperlipidemia, unspecified: Secondary | ICD-10-CM | POA: Diagnosis not present

## 2018-06-12 DIAGNOSIS — Z1211 Encounter for screening for malignant neoplasm of colon: Secondary | ICD-10-CM | POA: Diagnosis not present

## 2018-06-12 DIAGNOSIS — Z122 Encounter for screening for malignant neoplasm of respiratory organs: Secondary | ICD-10-CM

## 2018-06-12 DIAGNOSIS — Z1239 Encounter for other screening for malignant neoplasm of breast: Secondary | ICD-10-CM | POA: Diagnosis not present

## 2018-06-12 DIAGNOSIS — Z0001 Encounter for general adult medical examination with abnormal findings: Secondary | ICD-10-CM | POA: Insufficient documentation

## 2018-06-12 DIAGNOSIS — Z0000 Encounter for general adult medical examination without abnormal findings: Secondary | ICD-10-CM

## 2018-06-12 DIAGNOSIS — I10 Essential (primary) hypertension: Secondary | ICD-10-CM | POA: Diagnosis not present

## 2018-06-12 DIAGNOSIS — I700 Atherosclerosis of aorta: Secondary | ICD-10-CM

## 2018-06-12 DIAGNOSIS — B029 Zoster without complications: Secondary | ICD-10-CM

## 2018-06-12 DIAGNOSIS — F17200 Nicotine dependence, unspecified, uncomplicated: Secondary | ICD-10-CM

## 2018-06-12 DIAGNOSIS — J309 Allergic rhinitis, unspecified: Secondary | ICD-10-CM

## 2018-06-12 DIAGNOSIS — L409 Psoriasis, unspecified: Secondary | ICD-10-CM

## 2018-06-12 DIAGNOSIS — R232 Flushing: Secondary | ICD-10-CM

## 2018-06-12 DIAGNOSIS — G8929 Other chronic pain: Secondary | ICD-10-CM

## 2018-06-12 DIAGNOSIS — J449 Chronic obstructive pulmonary disease, unspecified: Secondary | ICD-10-CM

## 2018-06-12 DIAGNOSIS — F4323 Adjustment disorder with mixed anxiety and depressed mood: Secondary | ICD-10-CM

## 2018-06-12 LAB — COMPREHENSIVE METABOLIC PANEL
ALT: 10 U/L (ref 0–35)
AST: 10 U/L (ref 0–37)
Albumin: 3.9 g/dL (ref 3.5–5.2)
Alkaline Phosphatase: 103 U/L (ref 39–117)
BUN: 11 mg/dL (ref 6–23)
CO2: 27 mEq/L (ref 19–32)
Calcium: 9 mg/dL (ref 8.4–10.5)
Chloride: 105 mEq/L (ref 96–112)
Creatinine, Ser: 0.9 mg/dL (ref 0.40–1.20)
GFR: 63.9 mL/min (ref >60.00)
Glucose, Bld: 94 mg/dL (ref 70–99)
Potassium: 4 mEq/L (ref 3.5–5.1)
Sodium: 138 mEq/L (ref 135–145)
Total Bilirubin: 0.3 mg/dL (ref 0.2–1.2)
Total Protein: 6.2 g/dL (ref 6.0–8.3)

## 2018-06-12 LAB — HEMOGLOBIN A1C: Hgb A1c MFr Bld: 5.4 % (ref 4.6–6.5)

## 2018-06-12 LAB — LIPID PANEL
Cholesterol: 85 mg/dL (ref 0–200)
HDL: 37.9 mg/dL — ABNORMAL LOW (ref >39.00)
LDL Cholesterol: 39 mg/dL (ref 0–99)
NonHDL: 46.7
Total CHOL/HDL Ratio: 2
Triglycerides: 38 mg/dL (ref 0.0–149.0)
VLDL: 7.6 mg/dL (ref 0.0–40.0)

## 2018-06-12 MED ORDER — ALBUTEROL SULFATE HFA 108 (90 BASE) MCG/ACT IN AERS: 2.0000 | INHALATION_SPRAY | Freq: Four times a day (QID) | RESPIRATORY_TRACT | 0 refills | Status: DC | PRN

## 2018-06-12 MED ORDER — TRAZODONE HCL 50 MG PO TABS: 25.0000 mg | ORAL_TABLET | Freq: Every evening | ORAL | 0 refills | Status: DC | PRN

## 2018-06-12 NOTE — Addendum Note (Signed)
Addended by: Jacqualin Combes on: 06/12/2018 11:59 AM   Modules accepted: Orders

## 2018-06-12 NOTE — Assessment & Plan Note (Signed)
34 pack year history, not interested in quitting. Referral placed for lung cancer screening.

## 2018-06-12 NOTE — Progress Notes (Signed)
   Subjective:    Patient ID: [name] [name], female    DOB: [birth year], 58 y.o.   MRN: [id number]  HPI  Ms. Ullman is a 58 year old female who presents today for complete physical.  Immunizations: -Tetanus: Completed in 2005 -Influenza: Due this season  -Pneumonia: Never completed  -Shingles: Never completed, had shingles in February 2020  Diet: She endorses a healthy diet. She eats some vegetables, mostly meat and starch, salad. She eats candy/cookies every night. She drinks mostly sweet tea and coffee, little water.   Exercise: She is not exercising.  Eye exam: Following regularly  Dental exam: No recent exam Colonoscopy: Never completed, interested in Cologuard Pap Smear: No recent pap smear, declines Mammogram: No recent mammogram, agrees Hep C Screen: Negative in 2018  BP Readings from Last 3 Encounters:  06/12/18 128/74  02/17/18 124/80  08/05/17 (!) 141/55      Review of Systems  Constitutional: Negative for unexpected weight change.  HENT: Negative for rhinorrhea.   Eyes: Positive for visual disturbance.  Respiratory: Negative for cough and shortness of breath.   Cardiovascular: Negative for chest pain.  Gastrointestinal: Negative for constipation and diarrhea.  Genitourinary: Negative for difficulty urinating.  Musculoskeletal: Positive for arthralgias and back pain.       Chronic back pain  Skin: Negative for rash.  Allergic/Immunologic: Negative for environmental allergies.  Neurological: Negative for dizziness, numbness and headaches.  Psychiatric/Behavioral: Positive for sleep disturbance. The patient is not nervous/anxious.        Ambien is not very effective any long. She will wake during the night often.       Objective:   Physical Exam         Assessment & Plan:

## 2018-06-12 NOTE — Assessment & Plan Note (Signed)
Doing well on Xyzal. Continue same.

## 2018-06-12 NOTE — Telephone Encounter (Signed)
Received referral for low dose lung cancer screening CT scan. Message left at phone number listed in EMR for patient to call me back to facilitate scheduling scan.  

## 2018-06-12 NOTE — Assessment & Plan Note (Signed)
Compliant to statin therapy and clopidogrel. BP under good control. Strongly advised she quit smoking. Lipid panel pending.

## 2018-06-12 NOTE — Assessment & Plan Note (Signed)
Overall stable, no longer seeing pain management.  Using cyclobenzaprine PRN.

## 2018-06-12 NOTE — Assessment & Plan Note (Signed)
Compliant to statin therapy, repeat lipids pending. °

## 2018-06-12 NOTE — Assessment & Plan Note (Signed)
Stable overall.  

## 2018-06-12 NOTE — Assessment & Plan Note (Signed)
Resolved. Will have her obtain Shingrix vaccination next year.

## 2018-06-12 NOTE — Assessment & Plan Note (Signed)
Stable in the office today, BMP pending. Continue current regimen.

## 2018-06-12 NOTE — Assessment & Plan Note (Signed)
Ambien no longer very effective. Will trial low dose Trazodone. She will update.

## 2018-06-12 NOTE — Patient Instructions (Addendum)
Stop by the lab prior to leaving today. I will notify you of your results once received.   Call [facility] to schedule your mammogram.   You will be contacted regarding your referral to lung cancer screening.  Please let us know if you have not been contacted within one week.   It's important to improve your diet by reducing consumption of fast food, fried food, processed snack foods, sugary drinks. Increase consumption of fresh vegetables and fruits, whole grains, water.  Ensure you are drinking 64 ounces of water daily.  Start exercising. You should be getting 150 minutes of moderate intensity exercise weekly.  Complete the Cologuard Kit once received.  It was a pleasure to see you today!   Preventive Care 40-64 Years, Female Preventive care refers to lifestyle choices and visits with your health care provider that can promote health and wellness. What does preventive care include?   A yearly physical exam. This is also called an annual well check.  Dental exams once or twice a year.  Routine eye exams. Ask your health care provider how often you should have your eyes checked.  Personal lifestyle choices, including: ? Daily care of your teeth and gums. ? Regular physical activity. ? Eating a healthy diet. ? Avoiding tobacco and drug use. ? Limiting alcohol use. ? Practicing safe sex. ? Taking low-dose aspirin daily starting at age 57. ? Taking vitamin and mineral supplements as recommended by your health care provider. What happens during an annual well check? The services and screenings done by your health care provider during your annual well check will depend on your age, overall health, lifestyle risk factors, and family history of disease. Counseling Your health care provider may ask you questions about your:  Alcohol use.  Tobacco use.  Drug use.  Emotional well-being.  Home and relationship well-being.  Sexual activity.  Eating habits.  Work and  work Statistician.  Method of birth control.  Menstrual cycle.  Pregnancy history. Screening You may have the following tests or measurements:  Height, weight, and BMI.  Blood pressure.  Lipid and cholesterol levels. These may be checked every 5 years, or more frequently if you are over 14 years old.  Skin check.  Lung cancer screening. You may have this screening every year starting at age 61 if you have a 30-pack-year history of smoking and currently smoke or have quit within the past 15 years.  Colorectal cancer screening. All adults should have this screening starting at age 10 and continuing until age 44. Your health care provider may recommend screening at age 37. You will have tests every 1-10 years, depending on your results and the type of screening test. People at increased risk should start screening at an earlier age. Screening tests may include: ? Guaiac-based fecal occult blood testing. ? Fecal immunochemical test (FIT). ? Stool DNA test. ? Virtual colonoscopy. ? Sigmoidoscopy. During this test, a flexible tube with a tiny camera (sigmoidoscope) is used to examine your rectum and lower colon. The sigmoidoscope is inserted through your anus into your rectum and lower colon. ? Colonoscopy. During this test, a long, thin, flexible tube with a tiny camera (colonoscope) is used to examine your entire colon and rectum.  Hepatitis C blood test.  Hepatitis B blood test.  Sexually transmitted disease (STD) testing.  Diabetes screening. This is done by checking your blood sugar (glucose) after you have not eaten for a while (fasting). You may have this done every 1-3 years.  Mammogram. This may be done every 1-2 years. Talk to your health care provider about when you should start having regular mammograms. This may depend on whether you have a family history of breast cancer.  BRCA-related cancer screening. This may be done if you have a family history of breast, ovarian,  tubal, or peritoneal cancers.  Pelvic exam and Pap test. This may be done every 3 years starting at age 66. Starting at age 39, this may be done every 5 years if you have a Pap test in combination with an HPV test.  Bone density scan. This is done to screen for osteoporosis. You may have this scan if you are at high risk for osteoporosis. Discuss your test results, treatment options, and if necessary, the need for more tests with your health care provider. Vaccines Your health care provider may recommend certain vaccines, such as:  Influenza vaccine. This is recommended every year.  Tetanus, diphtheria, and acellular pertussis (Tdap, Td) vaccine. You may need a Td booster every 10 years.  Varicella vaccine. You may need this if you have not been vaccinated.  Zoster vaccine. You may need this after age 61.  Measles, mumps, and rubella (MMR) vaccine. You may need at least one dose of MMR if you were born in 1957 or later. You may also need a second dose.  Pneumococcal 13-valent conjugate (PCV13) vaccine. You may need this if you have certain conditions and were not previously vaccinated.  Pneumococcal polysaccharide (PPSV23) vaccine. You may need one or two doses if you smoke cigarettes or if you have certain conditions.  Meningococcal vaccine. You may need this if you have certain conditions.  Hepatitis A vaccine. You may need this if you have certain conditions or if you travel or work in places where you may be exposed to hepatitis A.  Hepatitis B vaccine. You may need this if you have certain conditions or if you travel or work in places where you may be exposed to hepatitis B.  Haemophilus influenzae type b (Hib) vaccine. You may need this if you have certain conditions. Talk to your health care provider about which screenings and vaccines you need and how often you need them. This information is not intended to replace advice given to you by your health care provider. Make sure you  discuss any questions you have with your health care provider. Document Released: 01/14/2015 Document Revised: 02/07/2017 Document Reviewed: 10/19/2014 Elsevier Interactive Patient Education  2019 [employer].

## 2018-06-12 NOTE — Assessment & Plan Note (Signed)
Doing well on Paxil. Ambien not as effective any longer. Continue Paxil. Will try low dose Trazodone, she will update. Denies SI/HI.

## 2018-06-12 NOTE — Assessment & Plan Note (Signed)
Tetanus due, provided today. Declines pneumonia vaccination despite recommendations. Shingrix due next year. Mammogram overdue, orders placed. Pap smear overdue, declines. Colon cancer screening overdue, she opts for Cologuard. Lung cancer screening initiated. Encouraged a healthy diet and regular exercise. Exam stable. Labs pending. Follow up in 1 year for CPE.

## 2018-06-12 NOTE — Assessment & Plan Note (Signed)
Stable with infrequent use of Ventolin inhaler. New Rx sent to pharmacy as she is out. Referral placed for lung cancer screening.

## 2018-06-12 NOTE — Assessment & Plan Note (Signed)
Chronic, intermittent. Recent cortisone injection for eyes has reduced overall symptoms.

## 2018-06-24 ENCOUNTER — Telehealth: Payer: Self-pay | Admitting: *Deleted

## 2018-06-24 NOTE — Telephone Encounter (Signed)
Received referral for low dose lung cancer screening CT scan. Message left at phone number listed in EMR for patient to call me back to facilitate scheduling scan.  

## 2018-07-07 ENCOUNTER — Telehealth: Payer: Self-pay | Admitting: *Deleted

## 2018-07-07 ENCOUNTER — Encounter: Payer: Self-pay | Admitting: *Deleted

## 2018-07-07 NOTE — Telephone Encounter (Signed)
Attempted to contact patient regarding scheduling lung screening scan, however there is not answer or voicemail. Will mail notification.

## 2018-07-14 DIAGNOSIS — G4700 Insomnia, unspecified: Secondary | ICD-10-CM

## 2018-07-14 DIAGNOSIS — R0683 Snoring: Secondary | ICD-10-CM

## 2018-07-14 DIAGNOSIS — G2581 Restless legs syndrome: Secondary | ICD-10-CM

## 2018-07-15 MED ORDER — ZOLPIDEM TARTRATE 5 MG PO TABS: 5.0000 mg | ORAL_TABLET | Freq: Every evening | ORAL | 0 refills | Status: DC | PRN

## 2018-07-30 ENCOUNTER — Other Ambulatory Visit: Payer: Self-pay

## 2018-07-30 ENCOUNTER — Encounter: Payer: Self-pay | Admitting: Neurology

## 2018-07-30 ENCOUNTER — Ambulatory Visit (INDEPENDENT_AMBULATORY_CARE_PROVIDER_SITE_OTHER): Payer: 59 | Admitting: Neurology

## 2018-07-30 VITALS — BP 174/69 | HR 66 | Ht 59.0 in | Wt 108.0 lb

## 2018-07-30 DIAGNOSIS — G4719 Other hypersomnia: Secondary | ICD-10-CM | POA: Diagnosis not present

## 2018-07-30 DIAGNOSIS — R519 Headache, unspecified: Secondary | ICD-10-CM

## 2018-07-30 DIAGNOSIS — R0683 Snoring: Secondary | ICD-10-CM

## 2018-07-30 DIAGNOSIS — G479 Sleep disorder, unspecified: Secondary | ICD-10-CM

## 2018-07-30 DIAGNOSIS — R51 Headache: Secondary | ICD-10-CM

## 2018-07-30 DIAGNOSIS — G4761 Periodic limb movement disorder: Secondary | ICD-10-CM | POA: Diagnosis not present

## 2018-07-30 DIAGNOSIS — G4700 Insomnia, unspecified: Secondary | ICD-10-CM

## 2018-07-30 NOTE — Patient Instructions (Signed)

## 2018-07-30 NOTE — Progress Notes (Signed)
Subjective:    Patient ID: [name] [name] is a 58 y.o. female.  HPI     Star Age, MD, PhD [facility] [address] [place] [address]  Dear Belenda Cruise,   I saw your patient, [name] [name], upon your kind request in my sleep clinic today for initial consultation of her sleep disorder, in particular, concern for underlying obstructive sleep apnea and prior Hx of PLMs.  The patient is unaccompanied today.  As you know, [name] [name] is a 58 year old right-handed woman with an underlying medical history of reflux disease, hypertension, hyperlipidemia, peripheral vascular disease, smoking, depression, and back pain, who reports a longstanding history of difficulty initiating and maintaining sleep.  She was treated for restless leg symptoms in the past and took medication until about 10 years ago.  She had a sleep study many years ago which was negative for sleep apnea.  Sleep study results are not available for my review today.  She is currently taking Ambien 5 mg as needed.  Prior to that she has been on trazodone.  She has been on Ambien off and on for years, since approximately 2001.  She felt that the trazodone did not help at all.  In the past year she has had more difficulty staying asleep.  She wakes up with no particular reason in the middle of the night.  She tries to keep her bedtime of around midnight, rise time between 930 and 10 AM.  She has to be at work at 45.  She works from home, and Therapist, art.  She smokes about a pack per day.  She drinks caffeine in the form of coffee, 5 to 6 cups of week coffee per day no soda and some decaf tea.  She does snore per sister's report.  Sister lives with her, she has another sister.  She has no family history of sleep apnea but reports that her sister does snore.  She reports that her son age 15.  Has significant difficulty falling asleep.  She has a total of 4 grown children, 10 grandchildren.  She  is divorced since 2006.  When she had a sleep study she was told that she had twitching in her sleep.  Her ex-husband had complained about it as well.  She denies any telltale symptoms of restless leg syndrome however.  She does not have night to night nocturia but has had the occasional morning headache which is a frontal, dull type of headache, she has no history of migraines.  Her Past Medical History Is Significant For: Past Medical History:  Diagnosis Date  . Atypical chest pain   . Back pain    chronic  . Chronic insomnia   . Depression   . Eye abnormality    trouble seeing out of lt eye  . GERD (gastroesophageal reflux disease)   . Hyperlipidemia   . Hypertension   . PVD (peripheral vascular disease) ([place])    segmental femeropoliteal disease  . Tobacco abuse     Her Past Surgical History Is Significant For: Past Surgical History:  Procedure Laterality Date  . BUNIONECTOMY  1996   bilateral  . CARDIAC CATHETERIZATION N/A 11/01/2014   Procedure: Left Heart Cath and Coronary Angiography;  Surgeon: Dionisio David, MD;  Location: [facility];  Service: Cardiovascular;  Laterality: N/A;  . CERVICAL DISCECTOMY  06/21/2006    and fusion   . CESAREAN SECTION  1990  . ENDOMETRIAL ABLATION  June 2010  . LOWER EXTREMITY ANGIOGRAPHY Left 08/05/2017   Procedure: LOWER EXTREMITY ANGIOGRAPHY;  Surgeon: Algernon Huxley, MD;  Location: [facility];  Service: Cardiovascular;  Laterality: Left;  . NASAL SINUS SURGERY  1999  . TUBAL LIGATION    . WRIST SURGERY  2005   right wrist    Her Family History Is Significant For: Family History  Problem Relation Age of Onset  . Alcohol abuse Father        deceased age 51  . Emphysema Mother        deceased age 22  . ADD / ADHD Son     Her Social History Is Significant For: Social History   Socioeconomic History  . Marital status: Divorced    Spouse name: Not on file  . Number of children: Not on file  . Years of  education: Not on file  . Highest education level: Not on file  Occupational History  . Not on file  Social Needs  . Financial resource strain: Not on file  . Food insecurity    Worry: Not on file    Inability: Not on file  . Transportation needs    Medical: Not on file    Non-medical: Not on file  Tobacco Use  . Smoking status: Current Every Day Smoker    Packs/day: 0.50  . Smokeless tobacco: Never Used  Substance and Sexual Activity  . Alcohol use: No  . Drug use: Not on file  . Sexual activity: Not on file  Lifestyle  . Physical activity    Days per week: Not on file    Minutes per session: Not on file  . Stress: Not on file  Relationships  . Social Herbalist on phone: Not on file    Gets together: Not on file    Attends religious service: Not on file    Active member of club or organization: Not on file    Attends meetings of clubs or organizations: Not on file    Relationship status: Not on file  Other Topics Concern  . Not on file  Social History Narrative   Divorced with 4 children    Current Smoker 1ppd    No alcohol   No illicit drug use      Occupation: Tax preparation        Her Allergies Are:  No Known Allergies:   Her Current Medications Are:  Outpatient Encounter Medications as of 07/30/2018  Medication Sig  . albuterol (VENTOLIN HFA) 108 (90 Base) MCG/ACT inhaler Inhale 2 puffs into the lungs every 6 (six) hours as needed for wheezing or shortness of breath.  Marland Kitchen amLODipine (NORVASC) 10 MG tablet Take 1 tablet by mouth once daily  . aspirin EC 81 MG tablet Take 1 tablet (81 mg total) by mouth daily.  Marland Kitchen atorvastatin (LIPITOR) 10 MG tablet Take 1 tablet (10 mg total) by mouth daily.  . clopidogrel (PLAVIX) 75 MG tablet Take 1 tablet (75 mg total) by mouth daily.  . fluticasone (FLONASE) 50 MCG/ACT nasal spray Place 1 spray into both nostrils 2 (two) times daily.  . metoprolol tartrate (LOPRESSOR) 25 MG tablet Take 1 tablet (25 mg total) by  mouth 2 (two) times daily. For blood pressure.  Marland Kitchen PARoxetine (PAXIL) 20 MG tablet Take 2 tablets (40 mg total) by mouth daily. (Patient taking differently: Take 40 mg by mouth daily at 3 pm. )  . zolpidem (AMBIEN) 5 MG tablet Take  1 tablet (5 mg total) by mouth at bedtime as needed for sleep.  . [DISCONTINUED] atropine 1 % ophthalmic solution Place 2 drops into the left eye 4 (four) times daily. (Patient not taking: Reported on 02/17/2018)  . [DISCONTINUED] cyclobenzaprine (FLEXERIL) 5 MG tablet Take 1 tablet (5 mg total) by mouth 3 (three) times daily as needed for muscle spasms.  . [DISCONTINUED] famotidine (PEPCID) 20 MG tablet Take 20 mg by mouth daily as needed for heartburn or indigestion.  . [DISCONTINUED] hydrOXYzine (ATARAX/VISTARIL) 25 MG tablet TAKE 1 TABLET(25 MG) BY MOUTH TWICE DAILY AS NEEDED FOR ANXIETY  . [DISCONTINUED] levocetirizine (XYZAL) 5 MG tablet Take 1 tablet (5 mg total) by mouth every evening. For allergies.  . [DISCONTINUED] mupirocin ointment (BACTROBAN) 2 % Place 1 application into the nose 2 (two) times daily.  . [DISCONTINUED] naproxen sodium (ALEVE) 220 MG tablet Take 440 mg by mouth daily as needed (pain).  . [DISCONTINUED] prednisoLONE acetate (PRED FORTE) 1 % ophthalmic suspension Place 1 drop into the left eye every hour as needed (while awake (NOT PRN< 1 hour while awake--no option for this in epic.)).   No facility-administered encounter medications on file as of 07/30/2018.   :  Review of Systems:  Out of a complete 14 point review of systems, all are reviewed and negative with the exception of these symptoms as listed below:   Review of Systems  Neurological:       Pt presents today to discuss her sleep. Pt reports that she had a sleep study in 2001-2002 that showed RLS. Pt does endorse snoring.  Epworth Sleepiness Scale 0= would never doze 1= slight chance of dozing 2= moderate chance of dozing 3= high chance of dozing  Sitting and reading:  1 Watching TV: 1 Sitting inactive in a public place (ex. Theater or meeting): 1 As a passenger in a car for an hour without a break: 2 Lying down to rest in the afternoon: 1 Sitting and talking to someone: 0 Sitting quietly after lunch (no alcohol): 2 In a car, while stopped in traffic: 1 Total: 9      Objective:  Neurological Exam  Physical Exam Physical Examination:   Vitals:   07/30/18 1334  BP: (!) 174/69  Pulse: 66    General Examination: The patient is a very pleasant 58 y.o. female in no acute distress. She appears well-developed and well-nourished and well groomed.   HEENT: Normocephalic, atraumatic, pupils are equal, round and reactive to light and accommodation. Funduscopic exam is normal with sharp disc margins noted. Corrective eyeglasses in place.  Hearing is grossly intact.  Neck circumference is a slender 13-5/8 inches.  She has on oropharynx examination a moderately crowded airway secondary to small airway and larger uvula noted, tonsils are small, Mallampati class II.  She has full dentures. Tongue protrudes centrally and palate elevates symmetrically.   Chest: Clear to auscultation without wheezing, rhonchi or crackles noted.  Heart: S1+S2+0, regular and normal without murmurs, rubs or gallops noted.   Abdomen: Soft, non-tender and non-distended with normal bowel sounds appreciated on auscultation.  Extremities: There is no pitting edema in the distal lower extremities bilaterally.   Skin: Warm and dry without trophic changes noted.  Musculoskeletal: exam reveals no obvious joint deformities, tenderness or joint swelling or erythema.   Neurologically:  Mental status: The patient is awake, alert and oriented in all 4 spheres. Her immediate and remote memory, attention, language skills and fund of knowledge are appropriate. There is no  evidence of aphasia, agnosia, apraxia or anomia. Speech is clear with normal prosody and enunciation. Thought process is  linear. Mood is normal and affect is normal.  Cranial nerves II - XII are as described above under HEENT exam. In addition: shoulder shrug is normal with equal shoulder height noted. Motor exam: Normal bulk, strength and tone is noted. There is no drift, tremor or rebound. Romberg is negative. Fine motor skills and coordination: grossly intact.  Cerebellar testing: No dysmetria or intention tremor on finger to nose testing. Heel to shin is unremarkable bilaterally. There is no truncal or gait ataxia.  Sensory exam: intact to light touch.  Gait, station and balance: She stands easily. No veering to one side is noted. No leaning to one side is noted. Posture is age-appropriate and stance is narrow based. Gait shows normal stride length and normal pace. No problems turning are noted. Tandem walk is unremarkable.   Assessment and Plan:  In summary, Beth A Cortese is a very pleasant 58 y.o.-year old female with an underlying medical history of reflux disease, hypertension, hyperlipidemia, peripheral vascular disease, smoking, depression, and back pain, who Presents for evaluation of her sleep disorder.  She has a longer standing history of difficulty initiating and maintaining sleep.  A sleep study several years ago showed leg movements at night.  She recalls trying ropinirole for several years but eventually came off of the medication without any repercussions.  She denies any telltale symptoms of restless leg syndrome.  She does snore and has a moderately crowded appearing airway.  She has had some morning headaches.  It would be important to rule out obstructive sleep apnea and an attended sleep study is indicated.  She would be willing to proceed with sleep study testing. I had a long chat with the patient about my findings and the diagnosis of OSA, its prognosis and treatment options. We talked about medical treatments, surgical interventions and non-pharmacological approaches. I explained in particular the  risks and ramifications of untreated moderate to severe OSA, especially with respect to developing cardiovascular disease down the Road, including congestive heart failure, difficult to treat hypertension, cardiac arrhythmias, or stroke. Even type 2 diabetes has, in part, been linked to untreated OSA. Symptoms of untreated OSA include daytime sleepiness, memory problems, mood irritability and mood disorder such as depression and anxiety, lack of energy, as well as recurrent headaches, especially morning headaches. We talked about smoking cessation and trying to maintain a healthy lifestyle in general, as well as the importance of weight control. I encouraged the patient to eat healthy, exercise daily and keep well hydrated, to keep a scheduled bedtime and wake time routine, to not skip any meals and eat healthy snacks in between meals. I advised the patient not to drive when feeling sleepy. I recommended the following at this time: sleep study.   I explained the sleep test procedure to the patient and also outlined possible surgical and non-surgical treatment options of OSA, including the use of a custom-made dental device (which would require a referral to a specialist dentist or oral surgeon), upper airway surgical options, such as pillar implants, radiofrequency surgery, tongue base surgery, and UPPP (which would involve a referral to an ENT surgeon). Rarely, jaw surgery such as mandibular advancement may be considered.  I also explained the CPAP treatment option to the patient, who indicated that she would be willing to try CPAP if the need arises. I explained the importance of being compliant with PAP treatment, not only  for insurance purposes but primarily to improve Her symptoms, and for the patient's long term health benefit, including to reduce Her cardiovascular risks. I answered all her questions today and the patient was in agreement. I plan to see her back after the sleep study is completed and  encouraged her to call with any interim questions, concerns, problems or updates.   Thank you very much for allowing me to participate in the care of this nice patient. If I can be of any further assistance to you please do not hesitate to call me at [phone].  Sincerely,   Star Age, MD, PhD

## 2018-08-03 DIAGNOSIS — F4323 Adjustment disorder with mixed anxiety and depressed mood: Secondary | ICD-10-CM

## 2018-08-03 DIAGNOSIS — G4700 Insomnia, unspecified: Secondary | ICD-10-CM

## 2018-08-03 DIAGNOSIS — I700 Atherosclerosis of aorta: Secondary | ICD-10-CM

## 2018-08-03 DIAGNOSIS — I10 Essential (primary) hypertension: Secondary | ICD-10-CM

## 2018-08-03 DIAGNOSIS — E785 Hyperlipidemia, unspecified: Secondary | ICD-10-CM

## 2018-08-05 MED ORDER — AMLODIPINE BESYLATE 10 MG PO TABS: 10.0000 mg | ORAL_TABLET | Freq: Every day | ORAL | 1 refills | Status: DC

## 2018-08-05 MED ORDER — PAROXETINE HCL 20 MG PO TABS
40.0000 mg | ORAL_TABLET | Freq: Every day | ORAL | 1 refills | Status: DC
Start: 2018-08-05 — End: 2019-03-17

## 2018-08-05 MED ORDER — CLOPIDOGREL BISULFATE 75 MG PO TABS: 75.0000 mg | ORAL_TABLET | Freq: Every day | ORAL | 3 refills | Status: DC

## 2018-08-05 MED ORDER — ATORVASTATIN CALCIUM 10 MG PO TABS
10.0000 mg | ORAL_TABLET | Freq: Every day | ORAL | 3 refills | Status: DC
Start: 2018-08-05 — End: 2019-08-03

## 2018-08-05 MED ORDER — METOPROLOL TARTRATE 25 MG PO TABS: 25.0000 mg | ORAL_TABLET | Freq: Two times a day (BID) | ORAL | 1 refills | Status: DC

## 2018-08-07 ENCOUNTER — Encounter: Payer: Self-pay | Admitting: Neurology

## 2018-08-12 MED ORDER — ZOLPIDEM TARTRATE 5 MG PO TABS: 5.0000 mg | ORAL_TABLET | Freq: Every evening | ORAL | 0 refills | Status: DC | PRN

## 2018-08-20 ENCOUNTER — Ambulatory Visit (INDEPENDENT_AMBULATORY_CARE_PROVIDER_SITE_OTHER): Payer: 59 | Admitting: Neurology

## 2018-08-20 DIAGNOSIS — G4719 Other hypersomnia: Secondary | ICD-10-CM

## 2018-08-20 DIAGNOSIS — G479 Sleep disorder, unspecified: Secondary | ICD-10-CM

## 2018-08-20 DIAGNOSIS — G4733 Obstructive sleep apnea (adult) (pediatric): Secondary | ICD-10-CM | POA: Diagnosis not present

## 2018-08-20 DIAGNOSIS — R0683 Snoring: Secondary | ICD-10-CM

## 2018-08-20 DIAGNOSIS — G4761 Periodic limb movement disorder: Secondary | ICD-10-CM

## 2018-08-20 DIAGNOSIS — G4700 Insomnia, unspecified: Secondary | ICD-10-CM

## 2018-08-20 DIAGNOSIS — R519 Headache, unspecified: Secondary | ICD-10-CM

## 2018-08-27 NOTE — Procedures (Signed)
Patient Information     First Name: [name] Last Name: [name] ID: [id number]  Birth Date: [birth year] [name]: 58 Gender:  Referring Provider: Tera Helper BMI: 21.8 (W=108 lb, H=4' 11'')  Neck Circ.:           13.5 Epworth:  9   Sleep Study Information    Study Date: Aug 20, 2018 S/H/A Version: 001.001.001.001 / 4.1.1528 / 28  History:      58 year old woman with a history of reflux disease, hypertension, hyperlipidemia, peripheral vascular disease, smoking, depression, and back pain, who reports a longstanding history of difficulty initiating and maintaining sleep.  She was treated for restless leg symptoms in the past and took medication until about 10 years ago.  Summary & Diagnosis:     OSA  Recommendations:     This home sleep test demonstrates overall mild - near-moderate (by number of events) - obstructive sleep apnea with a total AHI of 14.7/hour and O2 nadir of 89%. Given the patient's medical history and sleep related complaints, treatment with positive airway pressure is recommended. This can be achieved in the form of autoPAP trial/titration at home. A  full night CPAP titration study will help with proper treatment settings and mask fitting if needed. Alternative treatments include weight loss along with avoidance of the supine sleep position, or an oral appliance in appropriate candidates.   Please note that untreated obstructive sleep apnea may carry additional perioperative morbidity. Patients with significant obstructive sleep apnea should receive perioperative PAP therapy and the surgeons and particularly the anesthesiologist should be informed of the diagnosis and the severity of the sleep disordered breathing. The patient should be cautioned not to drive, work at heights, or operate dangerous or heavy equipment when tired or sleepy. Review and reiteration of good sleep hygiene measures should be pursued with any patient. Other causes of the patient's symptoms, including  circadian rhythm disturbances, an underlying mood disorder, medication effect and/or an underlying medical problem cannot be ruled out based on this test. Clinical correlation is recommended.   The patient and his referring provider will be notified of the test results. The patient will be seen in follow up in sleep clinic at [facility].  I certify that I have reviewed the raw data recording prior to the issuance of this report in accordance with the standards of the American Academy of Sleep Medicine (AASM).  [name] Age, MD, PhD [facility]) Diplomat, ABPN (Neurology and Sleep)          Sleep Summary    Oxygen Saturation Statistics     Start Study Time: End Study Time: Total Recording Time:  11:59:49PM 9:25:26 AM 9 hrs, 30min  Total Sleep Time % REM of Sleep Time:  6 hrs, 42 min 15.8    Mean: 94 Minimum: 89 Maximum: 99  Mean of Desaturations Nadirs (%):   93  Oxygen Desatur. %: 4-9 10-20 >20 Total  Events Number Total  27 100.0  0 0.0  0 0.0  27 100.0  Oxygen Saturation: <90 <=88 <85 <80 <70  Duration (minutes): Sleep % 0.0 0.0 0.0 0.0 0.0 0.0 0.0 0.0 0.0 0.0     Respiratory Indices      Total Events REM NREM All Night  pRDI:  126  pAHI:  98 ODI:  27  pAHIc:  31  % CSR: 0.0 15.2 14.2 4.7 6.6 19.6 14.8 3.9 4.3 18.9 14.7 4.1 4.7       Pulse Rate Statistics during  Sleep (BPM)      Mean:  67 Minimum: 55 Maximum: 84    Indices are calculated using technically valid sleep time of  6 hrs, 39 min. pRDI/pAHI are calculated using oxi desaturations ? 3% Sit N/A Body Position Statistics  Position Supine Prone Right Left Non-Supine  Sleep (min) 180.0 30.0 77.0 115.1 222.1  Sleep % 44.8 7.5 19.1 28.6 55.2  pRDI 27.2 8.0 6.2 17.3 12.2  pAHI 23.1 4.0 2.3 12.6 7.9  ODI 6.4 0.0 2.3 2.6 2.2     Snoring Statistics Snoring Level (dB) >40 >50 >60 >70 >80 >Threshold (45)  Sleep (min) 211.6 65.1 10.2 0.6 0.0 107.3  Sleep % 52.6 16.2  2.5 0.1 0.0 26.7    Mean: 44 dB Sleep Stages Chart                       pAHI=14.7                                   Mild              Moderate                    Severe

## 2018-08-27 NOTE — Progress Notes (Signed)
Patient referred by Alma Friendly, NP, seen by me on 07/30/18, HST on 08/20/18.    Please call and notify the patient that the recent home sleep test showed obstructive sleep apnea. OSA is overall mild, but more in the near-moderate range, and worth treating to see if she feels better after treatment. To that end I recommend treatment for this in the form of autoPAP, which means, that we don't have to bring her in for a sleep study with CPAP, but will let her try an autoPAP machine at home, through a DME company (of her choice, or as per insurance requirement). The DME representative will educate her on how to use the machine, how to put the mask on, etc. I have placed an order in the chart. Please send referral, talk to patient, send report to referring MD. We will need a FU in sleep clinic for 10 weeks post-PAP set up, please arrange that with me or one of our NPs. Thanks,   Star Age, MD, PhD [facility])

## 2018-08-27 NOTE — Addendum Note (Signed)
Addended by: Star Age on: 08/27/2018 07:27 PM   Modules accepted: Orders

## 2018-08-28 ENCOUNTER — Telehealth: Payer: Self-pay

## 2018-08-28 NOTE — Telephone Encounter (Signed)
I called pt. I advised pt that Dr. Rexene Alberts reviewed their sleep study results and found that pt has overall mild osa, near moderate osa. Dr. Rexene Alberts recommends that pt start an auto pap at home. I reviewed PAP compliance expectations with the pt. Pt is agreeable to starting an auto-PAP. I advised pt that an order will be sent to a DME, [facility], and [facility] will call the pt within about one week after they file with the pt's insurance. [facility] will show the pt how to use the machine, fit for masks, and troubleshoot the auto-PAP if needed. A follow up appt was made for insurance purposes with Dr. Rexene Alberts on 11/06/18 at 2:00pm. Pt verbalized understanding to arrive 15 minutes early and bring their auto-PAP. A letter with all of this information in it will be mailed to the pt as a reminder. I verified with the pt that the address we have on file is correct. Pt verbalized understanding of results. Pt had no questions at this time but was encouraged to call back if questions arise. I have sent the order to [facility] and have received confirmation that they have received the order.

## 2018-08-28 NOTE — Telephone Encounter (Signed)
-----   Message from Star Age, MD sent at 08/27/2018  7:27 PM EDT ----- Patient referred by Alma Friendly, NP, seen by me on 07/30/18, HST on 08/20/18.    Please call and notify the patient that the recent home sleep test showed obstructive sleep apnea. OSA is overall mild, but more in the near-moderate range, and worth treating to see if she feels better after treatment. To that end I recommend treatment for this in the form of autoPAP, which means, that we don't have to bring her in for a sleep study with CPAP, but will let her try an autoPAP machine at home, through a DME company (of her choice, or as per insurance requirement). The DME representative will educate her on how to use the machine, how to put the mask on, etc. I have placed an order in the chart. Please send referral, talk to patient, send report to referring MD. We will need a FU in sleep clinic for 10 weeks post-PAP set up, please arrange that with me or one of our NPs. Thanks,   Star Age, MD, PhD [facility])

## 2018-09-05 ENCOUNTER — Other Ambulatory Visit: Payer: Self-pay | Admitting: Primary Care

## 2018-09-05 DIAGNOSIS — G4700 Insomnia, unspecified: Secondary | ICD-10-CM

## 2018-09-06 NOTE — Telephone Encounter (Signed)
Last filled on 08/12/2018 for #30 with 0 refill LOV 06/12/2018 for CPE

## 2018-10-05 ENCOUNTER — Other Ambulatory Visit: Payer: Self-pay | Admitting: Family Medicine

## 2018-10-05 DIAGNOSIS — G4700 Insomnia, unspecified: Secondary | ICD-10-CM

## 2018-10-08 ENCOUNTER — Other Ambulatory Visit: Payer: Self-pay | Admitting: Family Medicine

## 2018-10-08 DIAGNOSIS — G4700 Insomnia, unspecified: Secondary | ICD-10-CM

## 2018-10-08 NOTE — Telephone Encounter (Signed)
Will send to PCP 

## 2018-10-08 NOTE — Telephone Encounter (Signed)
Received this message from [facility]: "Just letting you know that I have tried to contact this patient and no answer. 9/3, (she called back and said that she could not afford this and that someone owed her money and she would call me back when they pay her.) 9/29, and 10/5 with no answer I am going to void her order. I have even offered to have her come in and set up on a payment arrangement and she said that she still couldn't afford it. She has never called back."  I called pt to discuss. No answer, left a message asking her to call me back.

## 2018-10-09 NOTE — Telephone Encounter (Signed)
Last prescribed on 09/10/2018 . Last appointment on 06/12/2018. No future appointment

## 2018-10-10 MED ORDER — ZOLPIDEM TARTRATE 5 MG PO TABS: 5.0000 mg | ORAL_TABLET | Freq: Every day | ORAL | 0 refills | Status: DC

## 2018-10-10 NOTE — Telephone Encounter (Signed)
This is the first I'm seeing a refill request for Ambien. I will get this sent off to her pharmacy now.

## 2018-10-10 NOTE — Telephone Encounter (Signed)
Pt left vm that her pharmacy requested refill ambien last wk and has not gotten refill yet. Pt request refill done today so can pick med up.

## 2018-11-06 ENCOUNTER — Ambulatory Visit: Payer: Self-pay | Admitting: Neurology

## 2018-12-27 ENCOUNTER — Other Ambulatory Visit: Payer: Self-pay | Admitting: Primary Care

## 2018-12-27 DIAGNOSIS — G4700 Insomnia, unspecified: Secondary | ICD-10-CM

## 2018-12-30 NOTE — Telephone Encounter (Signed)
Please in [place] absence. Last prescribed on 10/10/2018 #90 with 0 refills . Last appointment on 06/12/2018. No future appointment

## 2019-03-17 ENCOUNTER — Other Ambulatory Visit: Payer: Self-pay | Admitting: Primary Care

## 2019-03-17 DIAGNOSIS — I10 Essential (primary) hypertension: Secondary | ICD-10-CM

## 2019-03-17 DIAGNOSIS — F4323 Adjustment disorder with mixed anxiety and depressed mood: Secondary | ICD-10-CM

## 2019-03-26 ENCOUNTER — Other Ambulatory Visit: Payer: Self-pay | Admitting: Internal Medicine

## 2019-03-26 DIAGNOSIS — G4700 Insomnia, unspecified: Secondary | ICD-10-CM

## 2019-03-26 NOTE — Telephone Encounter (Signed)
Last prescribed on 01/08/2019 #90 with 0 refills . Last appointment on 06/12/2018. No future appointment  Please advise

## 2019-03-27 NOTE — Telephone Encounter (Signed)
Noted, refill sent to pharmacy. PMP aware reviewed.

## 2019-03-31 DIAGNOSIS — L409 Psoriasis, unspecified: Secondary | ICD-10-CM

## 2019-06-02 ENCOUNTER — Encounter: Payer: Self-pay | Admitting: Rheumatology

## 2019-06-02 ENCOUNTER — Ambulatory Visit: Payer: Self-pay

## 2019-06-02 ENCOUNTER — Ambulatory Visit (INDEPENDENT_AMBULATORY_CARE_PROVIDER_SITE_OTHER): Payer: Managed Care, Other (non HMO)

## 2019-06-02 ENCOUNTER — Ambulatory Visit (INDEPENDENT_AMBULATORY_CARE_PROVIDER_SITE_OTHER): Payer: Managed Care, Other (non HMO) | Admitting: Rheumatology

## 2019-06-02 ENCOUNTER — Other Ambulatory Visit: Payer: Self-pay | Admitting: *Deleted

## 2019-06-02 ENCOUNTER — Telehealth: Payer: Self-pay | Admitting: Pharmacist

## 2019-06-02 ENCOUNTER — Telehealth: Payer: Self-pay | Admitting: *Deleted

## 2019-06-02 ENCOUNTER — Other Ambulatory Visit: Payer: Self-pay

## 2019-06-02 VITALS — BP 192/70 | HR 75 | Resp 14 | Ht 59.0 in | Wt 110.0 lb

## 2019-06-02 DIAGNOSIS — I700 Atherosclerosis of aorta: Secondary | ICD-10-CM

## 2019-06-02 DIAGNOSIS — L4050 Arthropathic psoriasis, unspecified: Secondary | ICD-10-CM | POA: Diagnosis not present

## 2019-06-02 DIAGNOSIS — M79671 Pain in right foot: Secondary | ICD-10-CM | POA: Diagnosis not present

## 2019-06-02 DIAGNOSIS — M961 Postlaminectomy syndrome, not elsewhere classified: Secondary | ICD-10-CM

## 2019-06-02 DIAGNOSIS — M79641 Pain in right hand: Secondary | ICD-10-CM | POA: Diagnosis not present

## 2019-06-02 DIAGNOSIS — F17200 Nicotine dependence, unspecified, uncomplicated: Secondary | ICD-10-CM

## 2019-06-02 DIAGNOSIS — I10 Essential (primary) hypertension: Secondary | ICD-10-CM

## 2019-06-02 DIAGNOSIS — M25552 Pain in left hip: Secondary | ICD-10-CM

## 2019-06-02 DIAGNOSIS — M461 Sacroiliitis, not elsewhere classified: Secondary | ICD-10-CM | POA: Diagnosis not present

## 2019-06-02 DIAGNOSIS — Z840 Family history of diseases of the skin and subcutaneous tissue: Secondary | ICD-10-CM

## 2019-06-02 DIAGNOSIS — M25551 Pain in right hip: Secondary | ICD-10-CM

## 2019-06-02 DIAGNOSIS — Z79899 Other long term (current) drug therapy: Secondary | ICD-10-CM

## 2019-06-02 DIAGNOSIS — J449 Chronic obstructive pulmonary disease, unspecified: Secondary | ICD-10-CM

## 2019-06-02 DIAGNOSIS — R5382 Chronic fatigue, unspecified: Secondary | ICD-10-CM

## 2019-06-02 DIAGNOSIS — M79672 Pain in left foot: Secondary | ICD-10-CM

## 2019-06-02 DIAGNOSIS — H209 Unspecified iridocyclitis: Secondary | ICD-10-CM | POA: Diagnosis not present

## 2019-06-02 DIAGNOSIS — Z8639 Personal history of other endocrine, nutritional and metabolic disease: Secondary | ICD-10-CM

## 2019-06-02 DIAGNOSIS — M79642 Pain in left hand: Secondary | ICD-10-CM | POA: Diagnosis not present

## 2019-06-02 DIAGNOSIS — L409 Psoriasis, unspecified: Secondary | ICD-10-CM

## 2019-06-02 DIAGNOSIS — Z1589 Genetic susceptibility to other disease: Secondary | ICD-10-CM

## 2019-06-02 DIAGNOSIS — F4323 Adjustment disorder with mixed anxiety and depressed mood: Secondary | ICD-10-CM

## 2019-06-02 DIAGNOSIS — G8929 Other chronic pain: Secondary | ICD-10-CM

## 2019-06-02 DIAGNOSIS — Z8719 Personal history of other diseases of the digestive system: Secondary | ICD-10-CM

## 2019-06-02 DIAGNOSIS — I739 Peripheral vascular disease, unspecified: Secondary | ICD-10-CM

## 2019-06-02 NOTE — Progress Notes (Signed)
Office Visit Note  Patient: [name] [name]             Date of Birth: 17-Nov-1960           MRN: [id number]             PCP: Pleas Koch, NP Referring: Pleas Koch, NP Visit Date: 06/02/2019 Occupation: '@GUAROCC'$ @  Subjective:  History of arthritis, psoriasis and uveitis.   History of Present Illness: Davy A Mckinlay is a 59 y.o. female seen in consultation per request of her PCP.  According to patient her symptoms started in 2015 with lower back pain.  She states she started going to the pain management where she was given steroid injections to her lumbar spine.  She states she is also given some narcotics for pain management.  In 2018 she was discharged from the clinic as she declined injection due to financial reasons.  She has been taking Aleve since then.  She states she continues to have discomfort in the lower back, SI joints and her hands.  She has noticed swelling in her hands off and on.  She is also had lot of nail changes.  About 3 years ago she developed a rash on her extremities.  Which started after an injury to her left toe.  She states she lost her left toenail.  She was seen by a dermatologist at [facility] dermatology who did biopsy and told her that she had psoriasis.  She states no treatment was given.  She states she has psoriasis mostly on her scalp, inside her nose, elbows, gluteal cleft and lower extremities.  She states usually the psoriasis is bad but she had a recent cortisone injection which cleared up her psoriasis.  2 years ago she started having inflammation in her left eye.  She states she almost lost her vision and was seen at the emergency room.  From there she was referred to Dr. Alanda Slim at [facility].  He diagnosed her with uveitis.  She states she has been having a flare every 2 months.  She has been given steroid injections twice to her eyes.  She has been also treated with eyedrops.  She states she has some episodes in her right eye which is not as  severe.  He also did some labs and her HLA-B27 was positive.  She is gravida 5, para 4, miscarriage 1.  Activities of Daily Living:  Patient reports morning stiffness for  20  minutes.   Patient Reports nocturnal pain.  Difficulty dressing/grooming: Denies Difficulty climbing stairs: Denies Difficulty getting out of chair: Denies Difficulty using hands for taps, buttons, cutlery, and/or writing: Reports  Review of Systems  Constitutional: Positive for fatigue. Negative for night sweats, weight gain and weight loss.  HENT: Positive for mouth dryness. Negative for mouth sores, trouble swallowing, trouble swallowing and nose dryness.   Eyes: Positive for pain and redness. Negative for itching, visual disturbance and dryness.  Respiratory: Negative for cough, shortness of breath and difficulty breathing.   Cardiovascular: Negative for chest pain, palpitations, hypertension, irregular heartbeat and swelling in legs/feet.  Gastrointestinal: Negative for blood in stool, constipation and diarrhea.  Endocrine: Negative for increased urination.  Genitourinary: Negative for difficulty urinating, painful urination and vaginal dryness.  Musculoskeletal: Positive for arthralgias, joint pain, joint swelling and morning stiffness. Negative for myalgias, muscle weakness, muscle tenderness and myalgias.  Skin: Positive for rash. Negative for color change, hair loss, skin tightness, ulcers and sensitivity to sunlight.  Allergic/Immunologic:  Negative for susceptible to infections.  Neurological: Positive for headaches. Negative for dizziness, numbness, memory loss, night sweats and weakness.  Hematological: Positive for bruising/bleeding tendency. Negative for swollen glands.  Psychiatric/Behavioral: Positive for depressed mood and sleep disturbance. Negative for confusion. The patient is nervous/anxious.     PMFS History:  Patient Active Problem List   Diagnosis Date Noted  . Preventative health care  06/12/2018  . Herpes zoster without complication 70/17/7939  . Psoriasis 04/06/2016  . Atherosclerosis of abdominal aorta ([place]) 07/16/2014  . Adjustment disorder with mixed anxiety and depressed mood 07/16/2014  . Cervical postlaminectomy syndrome 01/10/2012  . Hot flashes 11/02/2011  . Chronic fatigue 11/02/2011  . Chronic back pain 08/17/2011  . COPD (chronic obstructive pulmonary disease) ([place]) 04/21/2010  . Allergic rhinitis 07/01/2009  . STRESS INCONTINENCE 03/16/2008  . PAD (peripheral artery disease) ([place]) 07/10/2007  . LEG PAIN, BILATERAL 06/24/2007  . Hyperlipidemia 01/01/2007  . TOBACCO ABUSE 01/01/2007  . INSOMNIA, CHRONIC 01/01/2007  . RESTLESS LEG SYNDROME 01/01/2007  . Essential hypertension 01/01/2007    Past Medical History:  Diagnosis Date  . Atypical chest pain   . Back pain    chronic  . Chronic insomnia   . Depression   . Eye abnormality    trouble seeing out of lt eye  . GERD (gastroesophageal reflux disease)   . Hyperlipidemia   . Hypertension   . PVD (peripheral vascular disease) ([place])    segmental femeropoliteal disease  . Tobacco abuse     Family History  Problem Relation Age of Onset  . Alcohol abuse Father        deceased age 75  . Emphysema Mother        deceased age 4  . ADD / ADHD Son   . Healthy Son   . Asthma Sister   . ADD / ADHD Son   . Healthy Son   . Healthy Son   . Healthy Daughter    Past Surgical History:  Procedure Laterality Date  . BUNIONECTOMY  1996   bilateral  . CARDIAC CATHETERIZATION N/A 11/01/2014   Procedure: Left Heart Cath and Coronary Angiography;  Surgeon: Dionisio David, MD;  Location: [facility];  Service: Cardiovascular;  Laterality: N/A;  . CERVICAL DISCECTOMY  06/21/2006    and fusion   . CESAREAN SECTION  1990  . ENDOMETRIAL ABLATION  June 2010  . LOWER EXTREMITY ANGIOGRAPHY Left 08/05/2017   Procedure: LOWER EXTREMITY ANGIOGRAPHY;  Surgeon: Algernon Huxley, MD;  Location: [facility];  Service: Cardiovascular;  Laterality: Left;  . NASAL SINUS SURGERY  1999  . TUBAL LIGATION    . WRIST SURGERY  2005   right wrist   Social History   Social History Narrative   Divorced with 4 children    Current Smoker 1ppd    No alcohol   No illicit drug use      Occupation: Tax Surveyor, quantity History  Administered Date(s) Administered  . Td 06/21/2003  . Tdap 06/12/2018     Objective: Vital Signs: BP (!) 192/70 (BP Location: Right Arm, Patient Position: Sitting, Cuff Size: Normal)   Pulse 75   Resp 14   Ht _0  (1.499 m)   Wt 110 lb (49.9 kg)   BMI 22.22 kg/m    Physical Exam Vitals and nursing note reviewed.  Constitutional:      Appearance: She is well-developed.  HENT:  Head: Normocephalic and atraumatic.  Eyes:     Conjunctiva/sclera: Conjunctivae normal.  Cardiovascular:     Rate and Rhythm: Normal rate and regular rhythm.     Heart sounds: Normal heart sounds.  Pulmonary:     Effort: Pulmonary effort is normal.     Breath sounds: Normal breath sounds.  Abdominal:     General: Bowel sounds are normal.     Palpations: Abdomen is soft.  Musculoskeletal:     Cervical back: Normal range of motion.  Lymphadenopathy:     Cervical: No cervical adenopathy.  Skin:    General: Skin is warm and dry.     Capillary Refill: Capillary refill takes less than 2 seconds.     Comments: Psoriasis patches noted on bilateral elbows, lower extremities, gluteal cleft.  Nail dystrophy was noted in her hands and toes.  Neurological:     Mental Status: She is alert and oriented to person, place, and time.  Psychiatric:        Behavior: Behavior normal.      Musculoskeletal Exam: C-spine, thoracic and lumbar spine with good range of motion.  She had bilateral SI joint tenderness on examination.  Shoulder joints, elbow joints, wrist joints with good range of motion.  She has mild thickening of PIP and DIP joints but not much synovitis.  She has  painful range of motion of bilateral hip joints.  Knee joints and ankle joints with good range of motion.  She has no evidence of Achilles tendinitis or plantar fasciitis.  She has bilateral bunionectomy.  No synovitis was noted over MTPs or PIPs or DIPs.  CDAI Exam: CDAI Score: -- Patient Global: --; Provider Global: -- Swollen: --; Tender: -- Joint Exam 06/02/2019   No joint exam has been documented for this visit   There is currently no information documented on the homunculus. Go to the Rheumatology activity and complete the homunculus joint exam.  Investigation: No additional findings.  Imaging: No results found.  Recent Labs: Lab Results  Component Value Date   WBC 12.7 (H) 07/26/2017   HGB 13.4 07/26/2017   PLT 248 07/26/2017   NA 138 06/12/2018   K 4.0 06/12/2018   CL 105 06/12/2018   CO2 27 06/12/2018   GLUCOSE 94 06/12/2018   BUN 11 06/12/2018   CREATININE 0.90 06/12/2018   BILITOT 0.3 06/12/2018   ALKPHOS 103 06/12/2018   AST 10 06/12/2018   ALT 10 06/12/2018   PROT 6.2 06/12/2018   ALBUMIN 3.9 06/12/2018   CALCIUM 9.0 06/12/2018   GFRAA >60 07/26/2017   07/26/17: ANA-, ESR 17, RF<10, RPR-, Ace 48, HLA-B27+, p-ANCA-  Speciality Comments: No specialty comments available.  Procedures:  No procedures performed Allergies: Patient has no known allergies.   Assessment / Plan:     Visit Diagnoses: Psoriatic arthropathy (HCC)-patient has pain in multiple joints involving her bilateral hands, bilateral shoulders, bilateral feet and her hip joints.  She has chronic SI joint pain.  I reviewed one of the old MRIs which showed sacroiliitis.  She has episodic increased pain in her hands to the point she has difficulty making a fist.  I detailed discussion regarding different treatment options.  With her history of uveitis and sacroiliitis Humira will be the right choice.  Once we have labs and CT chest results available we can apply for Humira.  Psoriasis - She had  positive skin biopsy at [facility] dermatology.  Patient has not used any treatment.  Uveitis - She has history  of recurrent uveitis for last 2 years.  She is followed by Dr. Alanda Slim at [facility] ophthalmology.  She had recent cortisone injection.  Sacroiliitis ([place]) - I reviewed MRI of her hip from December 21, 2013 which showed bilateral sacroiliitis.  Patient gives history of chronic SI joint pain.  HLA B27 (HLA B27 positive)  Pain in both hands -she complains of pain and discomfort in her bilateral hands.  She has intermittent swelling.  Plan: XR Hand 2 View Right, XR Hand 2 View Left, juxta-articular osteopenia and osteoarthritic changes were noted.  No erosive changes were noted.  Sedimentation rate  Chronic pain of both hips -she had painful range of motion of bilateral hip joints.  Plan: XR HIPS BILAT W OR W/O PELVIS 3-4 VIEWS.  Bilateral SI joint sclerosis was noted.  No significant hip joint narrowing was noted.  Pain in both feet -she has pain in her bilateral feet but no synovitis was noted.  She had bilateral bunionectomy in the past.  Plan: XR Foot 2 Views Right, XR Foot 2 Views Left.  X-rays were consistent with osteoarthritis and postsurgical changes.  High risk medication use - Plan: CBC with Differential/Platelet, COMPLETE METABOLIC PANEL WITH GFR, Hepatitis B core antibody, IgM, Hepatitis B surface antigen, Hepatitis C antibody, HIV Antibody (routine testing w rflx), QuantiFERON-TB Gold Plus, Serum protein electrophoresis with reflex, IgG, IgA, IgM  Family history of psoriasis - son  Cervical postlaminectomy syndrome  Essential hypertension  PAD (peripheral artery disease) (HCC)  Atherosclerosis of abdominal aorta (HCC)  History of hyperlipidemia  Chronic obstructive pulmonary disease, unspecified COPD type (HCC)  Smoker - 1 PPDx 40 years.  Smoking cessation was discussed.  I will also obtain CT scan of the chest for cancer screening.  Chronic fatigue - Plan:  CK  History of gastroesophageal reflux (GERD)  Adjustment disorder with mixed anxiety and depressed mood  Orders: Orders Placed This Encounter  Procedures  . XR Hand 2 View Right  . XR Hand 2 View Left  . XR HIPS BILAT W OR W/O PELVIS 3-4 VIEWS  . XR Foot 2 Views Right  . XR Foot 2 Views Left  . CBC with Differential/Platelet  . COMPLETE METABOLIC PANEL WITH GFR  . CK  . Sedimentation rate  . Hepatitis B core antibody, IgM  . Hepatitis B surface antigen  . Hepatitis C antibody  . HIV Antibody (routine testing w rflx)  . QuantiFERON-TB Gold Plus  . Serum protein electrophoresis with reflex  . IgG, IgA, IgM   No orders of the defined types were placed in this encounter.   Face-to-face time spent with patient was 60 minutes. Greater than 50% of time was spent in counseling and coordination of care.  Follow-Up Instructions: Return for Psoriatic arthritis, uveitis, sacroiliitis.   Bo Merino, MD  Note - This record has been created using Editor, commissioning.  Chart creation errors have been sought, but may not always  have been located. Such creation errors do not reflect on  the standard of medical care.

## 2019-06-02 NOTE — Telephone Encounter (Signed)
Patient approved for Humira.  New patient follow up scheduled for 06/24/19.

## 2019-06-02 NOTE — Telephone Encounter (Signed)
Please start benefits investigation for Humira for the treatment of uveitis.  Prior therapy includes steroid injections.   Mariella Saa, PharmD, [place], CPP Clinical Specialty Pharmacist (Rheumatology and Pulmonology)  06/02/2019 11:01 AM

## 2019-06-02 NOTE — Telephone Encounter (Signed)
Received notification from [place] regarding a prior authorization for [place]. Authorization has been APPROVED from 05/03/19 to 08/31/19.   Authorization # [id number]  Par plan, patient must fill through [facility]. Patient can use a copay card.  11:23 AM Beatriz Chancellor, CPhT

## 2019-06-02 NOTE — Telephone Encounter (Signed)
Per Dr. Candise Che, CT chest screening should be ordered to r/o lung cancer.

## 2019-06-05 ENCOUNTER — Telehealth: Payer: Self-pay | Admitting: Rheumatology

## 2019-06-05 ENCOUNTER — Telehealth: Payer: Self-pay | Admitting: *Deleted

## 2019-06-05 DIAGNOSIS — R778 Other specified abnormalities of plasma proteins: Secondary | ICD-10-CM

## 2019-06-05 LAB — COMPLETE METABOLIC PANEL WITH GFR
AG Ratio: 2 (calc) (ref 1.0–2.5)
ALT: 11 U/L (ref 6–29)
AST: 10 U/L (ref 10–35)
Albumin: 4.7 g/dL (ref 3.6–5.1)
Alkaline phosphatase (APISO): 115 U/L (ref 37–153)
BUN: 13 mg/dL (ref 7–25)
CO2: 27 mmol/L (ref 20–32)
Calcium: 10 mg/dL (ref 8.6–10.4)
Chloride: 102 mmol/L (ref 98–110)
Creat: 0.8 mg/dL (ref 0.50–0.99)
GFR, Est African American: 93 mL/min/{1.73_m2} (ref >=60)
GFR, Est Non African American: 80 mL/min/{1.73_m2} (ref >=60)
Globulin: 2.4 g/dL (calc) (ref 1.9–3.7)
Glucose, Bld: 93 mg/dL (ref 65–99)
Potassium: 4.4 mmol/L (ref 3.5–5.3)
Sodium: 138 mmol/L (ref 135–146)
Total Bilirubin: 0.4 mg/dL (ref 0.2–1.2)
Total Protein: 7.1 g/dL (ref 6.1–8.1)

## 2019-06-05 LAB — CK: Total CK: 29 U/L (ref 29–143)

## 2019-06-05 LAB — CBC WITH DIFFERENTIAL/PLATELET
Absolute Monocytes: 551 cells/uL (ref 200–950)
Basophils Absolute: 62 cells/uL (ref 0–200)
Basophils Relative: 0.6 %
Eosinophils Absolute: 281 cells/uL (ref 15–500)
Eosinophils Relative: 2.7 %
HCT: 44.2 % (ref 35.0–45.0)
Hemoglobin: 14.9 g/dL (ref 11.7–15.5)
Lymphs Abs: 3037 cells/uL (ref 850–3900)
MCH: 30.8 pg (ref 27.0–33.0)
MCHC: 33.7 g/dL (ref 32.0–36.0)
MCV: 91.3 fL (ref 80.0–100.0)
MPV: 10 fL (ref 7.5–12.5)
Monocytes Relative: 5.3 %
Neutro Abs: 6469 cells/uL (ref 1500–7800)
Neutrophils Relative %: 62.2 %
Platelets: 233 10*3/uL (ref 140–400)
RBC: 4.84 10*6/uL (ref 3.80–5.10)
RDW: 13.8 % (ref 11.0–15.0)
Total Lymphocyte: 29.2 %
WBC: 10.4 10*3/uL (ref 3.8–10.8)

## 2019-06-05 LAB — PROTEIN ELECTROPHORESIS, SERUM, WITH REFLEX
Albumin ELP: 4.3 g/dL (ref 3.8–4.8)
Alpha 1: 0.3 g/dL (ref 0.2–0.3)
Alpha 2: 0.8 g/dL (ref 0.5–0.9)
Beta 2: 0.3 g/dL (ref 0.2–0.5)
Beta Globulin: 0.5 g/dL (ref 0.4–0.6)
Gamma Globulin: 0.8 g/dL (ref 0.8–1.7)
Total Protein: 7 g/dL (ref 6.1–8.1)

## 2019-06-05 LAB — IGG, IGA, IGM
IgG (Immunoglobin G), Serum: 781 mg/dL (ref 600–1640)
IgM, Serum: 118 mg/dL (ref 50–300)
Immunoglobulin A: 186 mg/dL (ref 47–310)

## 2019-06-05 LAB — SEDIMENTATION RATE: Sed Rate: 2 mm/h (ref 0–30)

## 2019-06-05 LAB — HIV ANTIBODY (ROUTINE TESTING W REFLEX): HIV 1&2 Ab, 4th Generation: NONREACTIVE

## 2019-06-05 LAB — QUANTIFERON-TB GOLD PLUS
Mitogen-NIL: >10 IU/mL
NIL: 0.02 IU/mL
QuantiFERON-TB Gold Plus: NEGATIVE
TB1-NIL: 0 IU/mL
TB2-NIL: 0 IU/mL

## 2019-06-05 LAB — HEPATITIS C ANTIBODY
Hepatitis C Ab: NONREACTIVE
SIGNAL TO CUT-OFF: 0.04 (ref <1.00)

## 2019-06-05 LAB — HEPATITIS B SURFACE ANTIGEN: Hepatitis B Surface Ag: NONREACTIVE

## 2019-06-05 LAB — IFE INTERPRETATION

## 2019-06-05 LAB — HEPATITIS B CORE ANTIBODY, IGM: Hep B C IgM: NONREACTIVE

## 2019-06-05 NOTE — Progress Notes (Signed)
All the results are within normal limits except abnormal IFE.  I will discuss results at the follow-up visit.  Please refer to hematology for abnormal IFE.

## 2019-06-05 NOTE — Telephone Encounter (Signed)
Order faxed 06/05/2019

## 2019-06-05 NOTE — Telephone Encounter (Signed)
-----   Message from Bo Merino, MD sent at 06/05/2019 12:15 PM EDT ----- All the results are within normal limits except abnormal IFE.  I will discuss results at the follow-up visit.  Please refer to hematology for abnormal IFE.

## 2019-06-05 NOTE — Telephone Encounter (Signed)
Please send order for CT scan. Fax# [phone]. Patient going to [facility] for CT.

## 2019-06-10 ENCOUNTER — Encounter: Payer: Self-pay | Admitting: Rheumatology

## 2019-06-11 ENCOUNTER — Ambulatory Visit (HOSPITAL_COMMUNITY): Payer: Managed Care, Other (non HMO)

## 2019-06-14 NOTE — Progress Notes (Signed)
Office Visit Note  Patient: [name] [name]             Date of Birth: 05-05-1960           MRN: [id number]             PCP: Pleas Koch, NP Referring: Pleas Koch, NP Visit Date: 06/24/2019 Occupation: _0 @  Subjective:  Medication management.   History of Present Illness: Noemi A Keilman is a 59 y.o. female with history of psoriatic arthritis, psoriasis, sacroiliitis and uveitis.  She continues to have pain and discomfort in her bilateral elbows and bilateral hands.  She continues to have discomfort in the SI joints.  She was seen by her ophthalmologist recently and has uveitis.  She is using prednisolone eyedrops.  He was recently given prednisone Dosepak which was helpful.  She was evaluated by hematologist for abnormal IFE.  She will have a follow-up appointment in 6 months.  CT scan of the chest showed pulmonary nodule.  She will follow up with the PCP regarding that.  Activities of Daily Living:  Patient reports morning stiffness for 1 hour.   Patient Reports nocturnal pain.  Difficulty dressing/grooming: Denies Difficulty climbing stairs: Denies Difficulty getting out of chair: Denies Difficulty using hands for taps, buttons, cutlery, and/or writing: Denies  Review of Systems  Constitutional: Positive for fatigue. Negative for night sweats, weight gain and weight loss.  HENT: Positive for mouth dryness. Negative for mouth sores, trouble swallowing, trouble swallowing and nose dryness.   Eyes: Positive for redness. Negative for pain, discharge, visual disturbance and dryness.  Respiratory: Negative for cough, shortness of breath and difficulty breathing.   Cardiovascular: Negative for chest pain, palpitations, hypertension, irregular heartbeat and swelling in legs/feet.  Gastrointestinal: Negative for blood in stool, constipation and diarrhea.  Endocrine: Positive for excessive thirst. Negative for increased urination.  Genitourinary: Negative for difficulty  urinating and vaginal dryness.  Musculoskeletal: Positive for arthralgias, joint pain and muscle tenderness. Negative for joint swelling, myalgias, muscle weakness, morning stiffness and myalgias.  Skin: Positive for rash and redness. Negative for color change, hair loss, skin tightness, ulcers and sensitivity to sunlight.  Allergic/Immunologic: Negative for susceptible to infections.  Neurological: Negative for dizziness, numbness, memory loss, night sweats and weakness.  Hematological: Negative for bruising/bleeding tendency and swollen glands.  Psychiatric/Behavioral: Positive for sleep disturbance. Negative for depressed mood. The patient is not nervous/anxious.     PMFS History:  Patient Active Problem List   Diagnosis Date Noted  . Preventative health care 06/12/2018  . Herpes zoster without complication 46/50/3546  . Psoriasis 04/06/2016  . Atherosclerosis of abdominal aorta ([place]) 07/16/2014  . Adjustment disorder with mixed anxiety and depressed mood 07/16/2014  . Cervical postlaminectomy syndrome 01/10/2012  . Hot flashes 11/02/2011  . Chronic fatigue 11/02/2011  . Chronic back pain 08/17/2011  . COPD (chronic obstructive pulmonary disease) ([place]) 04/21/2010  . Allergic rhinitis 07/01/2009  . STRESS INCONTINENCE 03/16/2008  . PAD (peripheral artery disease) ([place]) 07/10/2007  . LEG PAIN, BILATERAL 06/24/2007  . Hyperlipidemia 01/01/2007  . TOBACCO ABUSE 01/01/2007  . INSOMNIA, CHRONIC 01/01/2007  . RESTLESS LEG SYNDROME 01/01/2007  . Essential hypertension 01/01/2007    Past Medical History:  Diagnosis Date  . Atypical chest pain   . Back pain    chronic  . Chronic insomnia   . Depression   . Eye abnormality    trouble seeing out of lt eye  . GERD (gastroesophageal reflux disease)   .  Hyperlipidemia   . Hypertension   . PVD (peripheral vascular disease) ([place])    segmental femeropoliteal disease  . Tobacco abuse     Family History  Problem Relation Age of Onset    . Alcohol abuse Father        deceased age 87  . Emphysema Mother        deceased age 70  . ADD / ADHD Son   . Healthy Son   . Asthma Sister   . ADD / ADHD Son   . Healthy Son   . Healthy Son   . Healthy Daughter    Past Surgical History:  Procedure Laterality Date  . BUNIONECTOMY  1996   bilateral  . CARDIAC CATHETERIZATION N/A 11/01/2014   Procedure: Left Heart Cath and Coronary Angiography;  Surgeon: Dionisio David, MD;  Location: [facility];  Service: Cardiovascular;  Laterality: N/A;  . CERVICAL DISCECTOMY  06/21/2006    and fusion   . CESAREAN SECTION  1990  . ENDOMETRIAL ABLATION  June 2010  . LOWER EXTREMITY ANGIOGRAPHY Left 08/05/2017   Procedure: LOWER EXTREMITY ANGIOGRAPHY;  Surgeon: Algernon Huxley, MD;  Location: [facility];  Service: Cardiovascular;  Laterality: Left;  . NASAL SINUS SURGERY  1999  . TUBAL LIGATION    . WRIST SURGERY  2005   right wrist   Social History   Social History Narrative   Divorced with 4 children    Current Smoker 1ppd    No alcohol   No illicit drug use      Occupation: Tax Surveyor, quantity History  Administered Date(s) Administered  . Td 06/21/2003  . Tdap 06/12/2018     Objective: Vital Signs: BP (!) 145/62 (BP Location: Left Arm, Patient Position: Sitting, Cuff Size: Normal)   Pulse 78   Resp 16   Ht _0  (1.499 m)   Wt 109 lb 12.8 oz (49.8 kg)   BMI 22.18 kg/m    Physical Exam Vitals and nursing note reviewed.  Constitutional:      Appearance: She is well-developed.  HENT:     Head: Normocephalic and atraumatic.  Eyes:     Conjunctiva/sclera: Conjunctivae normal.  Cardiovascular:     Rate and Rhythm: Normal rate and regular rhythm.     Heart sounds: Normal heart sounds.  Pulmonary:     Effort: Pulmonary effort is normal.     Breath sounds: Normal breath sounds.  Abdominal:     General: Bowel sounds are normal.     Palpations: Abdomen is soft.  Musculoskeletal:      Cervical back: Normal range of motion.  Lymphadenopathy:     Cervical: No cervical adenopathy.  Skin:    General: Skin is warm and dry.     Capillary Refill: Capillary refill takes less than 2 seconds.     Comments: Psoriasis patches were noted over bilateral upper extremities, lower extremities and gluteal region.  Nail dystrophy was noted.  Neurological:     Mental Status: She is alert and oriented to person, place, and time.  Psychiatric:        Behavior: Behavior normal.      Musculoskeletal Exam: C-spine was in good range of motion.  She has tenderness on palpation of bilateral SI joints and difficulty walking due to SI joint pain and discomfort.  Shoulder joints, elbow joints, wrist joints, MCPs and PIPs with good range of motion.  She has nail dystrophy bilaterally.  Hip joints, knee joints, ankles with good range of motion with no synovitis.  There was no evidence of Achilles tendinitis or plantar fasciitis.  CDAI Exam: CDAI Score: -- Patient Global: --; Provider Global: -- Swollen: --; Tender: -- Joint Exam 06/24/2019   No joint exam has been documented for this visit   There is currently no information documented on the homunculus. Go to the Rheumatology activity and complete the homunculus joint exam.  Investigation: No additional findings.  Imaging: XR HIPS BILAT W OR W/O PELVIS 3-4 VIEWS  Result Date: 06/02/2019 Bilateral SI joint to sclerosis was noted consistent with sacroiliitis.  No significant hip joint narrowing was noted.  No chondrocalcinosis was noted. Impression: These findings are consistent with bilateral SI joint sclerosis.  XR Foot 2 Views Left  Result Date: 06/02/2019 Postsurgical changes in the right first MTP and the left fifth PIP was noted.  No erosive changes were noted.  PIP and DIP narrowing was noted.   No intertarsal tibiotalar joint space narrowing was noted. Impression: These findings are consistent with osteoarthritis of the foot.  XR Foot 2  Views Right  Result Date: 06/02/2019 First MTP post bunionectomy changes were noted.  PIP and DIP narrowing was noted.  No intertarsal activity or joint space narrowing was noted.  No erosive changes were noted. Impression: These findings are consistent with osteoarthritis of the foot.  XR Hand 2 View Left  Result Date: 06/02/2019 Juxta-articular osteopenia was noted.  CMC, PIP and DIP narrowing was noted.  No erosive changes were noted.  No intercarpal radiocarpal joint space narrowing was noted. Impression: These findings are consistent with inflammatory arthritis.  XR Hand 2 View Right  Result Date: 06/02/2019 Juxta-articular osteopenia was noted.  PIP and DIP narrowing was noted.  No erosive changes were noted.  No intercarpal radiocarpal joint space narrowing was noted. Impression: These findings are consistent with inflammatory arthritis.   Recent Labs: Lab Results  Component Value Date   WBC 10.4 06/02/2019   HGB 14.9 06/02/2019   PLT 233 06/02/2019   NA 138 06/02/2019   K 4.4 06/02/2019   CL 102 06/02/2019   CO2 27 06/02/2019   GLUCOSE 93 06/02/2019   BUN 13 06/02/2019   CREATININE 0.80 06/02/2019   BILITOT 0.4 06/02/2019   ALKPHOS 103 06/12/2018   AST 10 06/02/2019   ALT 11 06/02/2019   PROT 7.1 06/02/2019   PROT 7.0 06/02/2019   ALBUMIN 3.9 06/12/2018   CALCIUM 10.0 06/02/2019   GFRAA 93 06/02/2019   QFTBGOLDPLUS NEGATIVE 06/02/2019  June 02, 2019 IFE-a poorly defined area of restricted protein mobility, reactive with IgG and lambda antisera, HIV negative, hepatitis B-, hepatitis C negative, immunoglobulins normal, TB Gold negative, CK 29, ESR 2 07/26/17: ANA-, ESR 17, RF<10, RPR-, Ace 48, HLA-B27+, p-ANCA-    Speciality Comments: No specialty comments available.  Procedures:  No procedures performed Allergies: Patient has no known allergies.   Assessment / Plan:     Visit Diagnoses: Psoriatic arthropathy ([place]) - History of multiple arthralgias, sacroiliitis,  uveitis, psoriasis, HLA-B27 positive, family history of psoriasis.  Patient continues to have a lot of discomfort in the SI joint.  She had a flare of uveitis which was treated with oral prednisone and prednisone eyedrops.  We had detailed discussion regarding the use of Humira.  Indications side effects contraindications were discussed.  She was in agreement to proceed with Humira.  We will apply for Humira.  Once approved we will bring her in the  office for her first injection.  Medication counseling:   TB Test: June 02, 2019 Hepatitis panel: June 02, 2019 HIV: June 02, 2019 SPEP: June 02, 2019 Immunoglobulins: June 02, 2019  Chest x-ray: 2019 Next, CT chest was done on  06/10/2019  Does patient have diagnosis of heart failure?  No  Counseled patient that Humira is a TNF blocking agent.  Reviewed Humira dose of 40 mg every other week.  Counseled patient on purpose, proper use, and adverse effects of Humira.  Reviewed the most common adverse effects including infections, headache, and injection site reactions. Discussed that there is the possibility of an increased risk of malignancy but it is not well understood if this increased risk is due to the medication or the disease state.  Advised patient to get yearly dermatology exams due to risk of skin cancer.  Reviewed the importance of regular labs while on Humira therapy.  Advised patient to get standing labs one month after starting Humira then every 3 months.  Provided patient with standing lab orders.  Counseled patient that Humira should be held prior to scheduled surgery.  Counseled patient to avoid live vaccines while on Humira.  Advised patient to get annual influenza vaccine and the pneumococcal vaccine as needed.  Provided patient with medication education material and answered all questions.  Patient voiced understanding.  Patient consented to Humira.  Will upload consent into the media tab.  Reviewed storage instructions of Humira.  Advised initial  injection must be administered in office.    Patient voiced understanding.     Sacroiliitis ([place]) - MRI of her hip from December 21, 2013 which showed bilateral sacroiliitis.  She continues to have a lot of discomfort.  Uveitis - history of recurrent uveitis for last 2 years.  She is followed by Dr. Alanda Slim at [facility] ophthalmology.  Received notes from his office regarding recent flare.  Primary osteoarthritis of both hands-she continues to have some discomfort.  Primary osteoarthritis of both feet-no synovitis was noted.  High risk medication use - We discussed Humira at the last visit.  We will apply for Humira.  Psoriasis - Patient had positive skin biopsy at [facility] dermatology.  HLA B27 (HLA B27 positive)  Abnormal SPEP, and IFE.  She was referred to hematology.  They will be following her for abnormal IFE.  Family history of psoriasis -  her son.  Cervical postlaminectomy syndrome-chronic pain.  Chronic obstructive pulmonary disease, unspecified COPD type ([place])  Smoker - 1 PPDx 40 years.  CT chest showed emphysema and 2 mm nodule.  The CT report was sent to her PCP.  They are aware and will follow on the CT scan.  Smoking cessation was emphasized.  Essential hypertension-her systolic blood pressure is a still elevated.  PAD (peripheral artery disease) ([facility])  Atherosclerosis of abdominal aorta ([facility])  History of hyperlipidemia  History of gastroesophageal reflux (GERD)  Adjustment disorder with mixed anxiety and depressed mood  Orders: No orders of the defined types were placed in this encounter.  No orders of the defined types were placed in this encounter.     Follow-Up Instructions: Return in about 6 weeks (around 08/05/2019) for Psoriatic arthritis, sacroiliitis, uveitis.   Bo Merino, MD  Note - This record has been created using Editor, commissioning.  Chart creation errors have been sought, but may not always  have been located. Such creation errors  do not reflect on  the standard of medical care.

## 2019-06-15 ENCOUNTER — Encounter: Payer: Self-pay | Admitting: Oncology

## 2019-06-15 ENCOUNTER — Inpatient Hospital Stay: Payer: Managed Care, Other (non HMO) | Attending: Oncology | Admitting: Oncology

## 2019-06-15 ENCOUNTER — Inpatient Hospital Stay: Payer: Managed Care, Other (non HMO)

## 2019-06-15 ENCOUNTER — Other Ambulatory Visit: Payer: Self-pay

## 2019-06-15 VITALS — BP 192/64 | HR 86 | Temp 97.3°F | Resp 18 | Wt 108.8 lb

## 2019-06-15 DIAGNOSIS — F17210 Nicotine dependence, cigarettes, uncomplicated: Secondary | ICD-10-CM | POA: Diagnosis not present

## 2019-06-15 DIAGNOSIS — L4050 Arthropathic psoriasis, unspecified: Secondary | ICD-10-CM | POA: Diagnosis not present

## 2019-06-15 DIAGNOSIS — R778 Other specified abnormalities of plasma proteins: Secondary | ICD-10-CM

## 2019-06-15 DIAGNOSIS — H209 Unspecified iridocyclitis: Secondary | ICD-10-CM

## 2019-06-15 DIAGNOSIS — D472 Monoclonal gammopathy: Secondary | ICD-10-CM | POA: Insufficient documentation

## 2019-06-16 NOTE — Progress Notes (Signed)
Hematology/Oncology Consult note [facility] Telephone:([phone] [phone]  Patient Care Team: Pleas Koch, NP as PCP - General (Internal Medicine)   Name of the patient: [name] [name]  [id number]  1960/12/10    Reason for referral-abnormal SPEP   Referring physician-Dr. Estanislado Pandy  Date of visit: 06/16/19   History of presenting illness- Patient is a 59 year old female with sacroiliitis uveitis and psoriasis.  HLA-B27 associated.  She has been referred to [place] for abnormal SPEP which was done as a part of work-up ordered by rheumatology.  Quantitative immunoglobulins were normal SPEP showed poorly defined band of restricted protein mobility in the gammaglobulins.  Immunofixation showed restricted protein mobility is detected and reactive with IgG and lambda.  HIV hep B and hep C testing was negative.  CBC was normal with an H&H of 14.9/44.  CMP showed a serum creatinine of 0.8.  Calcium levels were normal.  Patient currently complains of occasional pain and redness in her right eye as well as low back pain.  Appetite and weight are stable.  She denies other complaints at this time  ECOG PS- 1  Pain scale- 4   Review of systems- Review of Systems  Constitutional: Positive for malaise/fatigue. Negative for chills, fever and weight loss.  HENT: Negative for congestion, ear discharge and nosebleeds.   Eyes: Negative for blurred vision.  Respiratory: Negative for cough, hemoptysis, sputum production, shortness of breath and wheezing.   Cardiovascular: Negative for chest pain, palpitations, orthopnea and claudication.  Gastrointestinal: Negative for abdominal pain, blood in stool, constipation, diarrhea, heartburn, melena, nausea and vomiting.  Genitourinary: Negative for dysuria, flank pain, frequency, hematuria and urgency.  Musculoskeletal: Positive for back pain. Negative for joint pain and myalgias.  Skin: Negative for rash.  Neurological:  Negative for dizziness, tingling, focal weakness, seizures, weakness and headaches.  Endo/Heme/Allergies: Does not bruise/bleed easily.  Psychiatric/Behavioral: Negative for depression and suicidal ideas. The patient does not have insomnia.     No Known Allergies  Patient Active Problem List   Diagnosis Date Noted  . Preventative health care 06/12/2018  . Herpes zoster without complication 93/23/5573  . Psoriasis 04/06/2016  . Atherosclerosis of abdominal aorta ([place]) 07/16/2014  . Adjustment disorder with mixed anxiety and depressed mood 07/16/2014  . Cervical postlaminectomy syndrome 01/10/2012  . Hot flashes 11/02/2011  . Chronic fatigue 11/02/2011  . Chronic back pain 08/17/2011  . COPD (chronic obstructive pulmonary disease) ([place]) 04/21/2010  . Allergic rhinitis 07/01/2009  . STRESS INCONTINENCE 03/16/2008  . PAD (peripheral artery disease) ([place]) 07/10/2007  . LEG PAIN, BILATERAL 06/24/2007  . Hyperlipidemia 01/01/2007  . TOBACCO ABUSE 01/01/2007  . INSOMNIA, CHRONIC 01/01/2007  . RESTLESS LEG SYNDROME 01/01/2007  . Essential hypertension 01/01/2007     Past Medical History:  Diagnosis Date  . Atypical chest pain   . Back pain    chronic  . Chronic insomnia   . Depression   . Eye abnormality    trouble seeing out of lt eye  . GERD (gastroesophageal reflux disease)   . Hyperlipidemia   . Hypertension   . PVD (peripheral vascular disease) ([place])    segmental femeropoliteal disease  . Tobacco abuse      Past Surgical History:  Procedure Laterality Date  . BUNIONECTOMY  1996   bilateral  . CARDIAC CATHETERIZATION N/A 11/01/2014   Procedure: Left Heart Cath and Coronary Angiography;  Surgeon: Dionisio David, MD;  Location: [facility];  Service: Cardiovascular;  Laterality: N/A;  .  CERVICAL DISCECTOMY  06/21/2006    and fusion   . CESAREAN SECTION  1990  . ENDOMETRIAL ABLATION  June 2010  . LOWER EXTREMITY ANGIOGRAPHY Left 08/05/2017   Procedure: LOWER  EXTREMITY ANGIOGRAPHY;  Surgeon: Algernon Huxley, MD;  Location: [facility];  Service: Cardiovascular;  Laterality: Left;  . NASAL SINUS SURGERY  1999  . TUBAL LIGATION    . WRIST SURGERY  2005   right wrist    Social History   Socioeconomic History  . Marital status: Divorced    Spouse name: Not on file  . Number of children: Not on file  . Years of education: Not on file  . Highest education level: Not on file  Occupational History  . Not on file  Tobacco Use  . Smoking status: Current Every Day Smoker    Packs/day: 0.50  . Smokeless tobacco: Never Used  Vaping Use  . Vaping Use: Never used  Substance and Sexual Activity  . Alcohol use: No  . Drug use: Never  . Sexual activity: Not on file  Other Topics Concern  . Not on file  Social History Narrative   Divorced with 4 children    Current Smoker 1ppd    No alcohol   No illicit drug use      Occupation: Tax Estate manager/land agent Strain:   . Difficulty of Paying Living Expenses:   Food Insecurity:   . Worried About Charity fundraiser in the Last Year:   . Arboriculturist in the Last Year:   Transportation Needs:   . Film/video editor (Medical):   Marland Kitchen Lack of Transportation (Non-Medical):   Physical Activity:   . Days of Exercise per Week:   . Minutes of Exercise per Session:   Stress:   . Feeling of Stress :   Social Connections:   . Frequency of Communication with Friends and Family:   . Frequency of Social Gatherings with Friends and Family:   . Attends Religious Services:   . Active Member of Clubs or Organizations:   . Attends Archivist Meetings:   Marland Kitchen Marital Status:   Intimate Partner Violence:   . Fear of Current or Ex-Partner:   . Emotionally Abused:   Marland Kitchen Physically Abused:   . Sexually Abused:      Family History  Problem Relation Age of Onset  . Alcohol abuse Father        deceased age 39  . Emphysema Mother         deceased age 41  . ADD / ADHD Son   . Healthy Son   . Asthma Sister   . ADD / ADHD Son   . Healthy Son   . Healthy Son   . Healthy Daughter      Current Outpatient Medications:  .  albuterol (VENTOLIN HFA) 108 (90 Base) MCG/ACT inhaler, Inhale 2 puffs into the lungs every 6 (six) hours as needed for wheezing or shortness of breath., Disp: 1 Inhaler, Rfl: 0 .  amLODipine (NORVASC) 10 MG tablet, TAKE 1 TABLET(10 MG) BY MOUTH DAILY, Disp: 90 tablet, Rfl: 0 .  atorvastatin (LIPITOR) 10 MG tablet, Take 1 tablet (10 mg total) by mouth daily. For cholesterol., Disp: 90 tablet, Rfl: 3 .  clopidogrel (PLAVIX) 75 MG tablet, Take 1 tablet (75 mg total) by mouth daily., Disp: 90 tablet, Rfl: 3 .  fluticasone (FLONASE) 50  MCG/ACT nasal spray, Place 1 spray into both nostrils 2 (two) times daily. (Patient taking differently: Place 1 spray into both nostrils daily as needed. ), Disp: 16 g, Rfl: 0 .  metoprolol tartrate (LOPRESSOR) 25 MG tablet, Take 1 tablet (25 mg total) by mouth 2 (two) times daily. For blood pressure., Disp: 180 tablet, Rfl: 1 .  PARoxetine (PAXIL) 20 MG tablet, TAKE 2 TABLETS(40 MG) BY MOUTH DAILY, Disp: 180 tablet, Rfl: 0 .  zolpidem (AMBIEN) 5 MG tablet, TAKE 1 TABLET(5 MG) BY MOUTH AT BEDTIME, Disp: 90 tablet, Rfl: 0 .  aspirin EC 81 MG tablet, Take 1 tablet (81 mg total) by mouth daily. (Patient not taking: Reported on 06/02/2019), Disp: 150 tablet, Rfl: 2   Physical exam:  Vitals:   06/15/19 0956  BP: (!) 192/64  Pulse: 86  Resp: 18  Temp: (!) 97.3 F (36.3 C)  TempSrc: Tympanic  SpO2: 99%  Weight: 108 lb 12.8 oz (49.4 kg)   Physical Exam Constitutional:      General: She is not in acute distress. Cardiovascular:     Rate and Rhythm: Normal rate and regular rhythm.     Heart sounds: Normal heart sounds.  Pulmonary:     Effort: Pulmonary effort is normal.     Breath sounds: Normal breath sounds.  Abdominal:     General: Bowel sounds are normal.     Palpations:  Abdomen is soft.  Skin:    General: Skin is warm and dry.  Neurological:     Mental Status: She is alert and oriented to person, place, and time.        CMP Latest Ref Rng & Units 06/02/2019  Glucose 65 - 99 mg/dL -  BUN 7 - 25 mg/dL -  Creatinine 0.50 - 0.99 mg/dL -  Sodium 135 - 146 mmol/L -  Potassium 3.5 - 5.3 mmol/L -  Chloride 98 - 110 mmol/L -  CO2 20 - 32 mmol/L -  Calcium 8.6 - 10.4 mg/dL -  Total Protein 6.1 - 8.1 g/dL 7.0  Total Bilirubin 0.2 - 1.2 mg/dL -  Alkaline Phos 39 - 117 U/L -  AST 10 - 35 U/L -  ALT 6 - 29 U/L -   CBC Latest Ref Rng & Units 06/02/2019  WBC 3.8 - 10.8 Thousand/uL 10.4  Hemoglobin 11.7 - 15.5 g/dL 14.9  Hematocrit 35 - 45 % 44.2  Platelets 140 - 400 Thousand/uL 233    No images are attached to the encounter.  XR HIPS BILAT W OR W/O PELVIS 3-4 VIEWS  Result Date: 06/02/2019 Bilateral SI joint to sclerosis was noted consistent with sacroiliitis.  No significant hip joint narrowing was noted.  No chondrocalcinosis was noted. Impression: These findings are consistent with bilateral SI joint sclerosis.  XR Foot 2 Views Left  Result Date: 06/02/2019 Postsurgical changes in the right first MTP and the left fifth PIP was noted.  No erosive changes were noted.  PIP and DIP narrowing was noted.   No intertarsal tibiotalar joint space narrowing was noted. Impression: These findings are consistent with osteoarthritis of the foot.  XR Foot 2 Views Right  Result Date: 06/02/2019 First MTP post bunionectomy changes were noted.  PIP and DIP narrowing was noted.  No intertarsal activity or joint space narrowing was noted.  No erosive changes were noted. Impression: These findings are consistent with osteoarthritis of the foot.  XR Hand 2 View Left  Result Date: 06/02/2019 Juxta-articular osteopenia was noted.  CMC, PIP  and DIP narrowing was noted.  No erosive changes were noted.  No intercarpal radiocarpal joint space narrowing was noted. Impression: These  findings are consistent with inflammatory arthritis.  XR Hand 2 View Right  Result Date: 06/02/2019 Juxta-articular osteopenia was noted.  PIP and DIP narrowing was noted.  No erosive changes were noted.  No intercarpal radiocarpal joint space narrowing was noted. Impression: These findings are consistent with inflammatory arthritis.   Assessment and plan- Patient is a 59 y.o. female return for abnormal SPEP  Patient noted to have abnormal SPEP without a quantifiable monoclonal IgG lambda paraprotein.  She does not have any significant anemia, renal failure or hypercalcemia.  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  I explained to the patient the natural history of MGUS and risk of progression to multiple myeloma.  I will plan to check a CBC with differential, CMP, myeloma panel, serum free light chains as well as random urine protein electrophoresis in 6 months and a follow-up at that time.  We will hold off on getting any other labs at this time.  Also it is unlikely that her MGUS is related to her underlying autoimmune psoriatic arthropathy   Thank you for this kind referral and the opportunity to participate in the care of this patient   Visit Diagnosis 1. Abnormal SPEP     Dr. Randa Evens, MD, MPH [facility] [phone] 06/16/2019  12:45 PM

## 2019-06-19 ENCOUNTER — Telehealth: Payer: Self-pay | Admitting: *Deleted

## 2019-06-19 NOTE — Telephone Encounter (Signed)
Noted.  We have her own lung cancer screening program so this makes sense. We will repeat CT in 1 year.

## 2019-06-19 NOTE — Telephone Encounter (Signed)
Patient had CT Chest performed at [facility] 06/10/2019, patient notified of results. 2 mm right upper lobe pulmonary nodule - continued annual low-dose lung cancer screening CT examinations. Results faxed to PCP, Alma Friendly, NP. PCP asked to take over follow up CT. Thank you.

## 2019-06-23 ENCOUNTER — Ambulatory Visit (HOSPITAL_COMMUNITY): Payer: Managed Care, Other (non HMO)

## 2019-06-24 ENCOUNTER — Encounter: Payer: Self-pay | Admitting: Rheumatology

## 2019-06-24 ENCOUNTER — Ambulatory Visit (INDEPENDENT_AMBULATORY_CARE_PROVIDER_SITE_OTHER): Payer: Managed Care, Other (non HMO) | Admitting: Rheumatology

## 2019-06-24 ENCOUNTER — Telehealth: Payer: Self-pay

## 2019-06-24 ENCOUNTER — Other Ambulatory Visit: Payer: Self-pay | Admitting: Primary Care

## 2019-06-24 ENCOUNTER — Other Ambulatory Visit: Payer: Self-pay

## 2019-06-24 VITALS — BP 145/62 | HR 78 | Resp 16 | Ht 59.0 in | Wt 109.8 lb

## 2019-06-24 DIAGNOSIS — L4050 Arthropathic psoriasis, unspecified: Secondary | ICD-10-CM | POA: Diagnosis not present

## 2019-06-24 DIAGNOSIS — Z1589 Genetic susceptibility to other disease: Secondary | ICD-10-CM

## 2019-06-24 DIAGNOSIS — Z79899 Other long term (current) drug therapy: Secondary | ICD-10-CM

## 2019-06-24 DIAGNOSIS — H209 Unspecified iridocyclitis: Secondary | ICD-10-CM

## 2019-06-24 DIAGNOSIS — Z840 Family history of diseases of the skin and subcutaneous tissue: Secondary | ICD-10-CM

## 2019-06-24 DIAGNOSIS — J449 Chronic obstructive pulmonary disease, unspecified: Secondary | ICD-10-CM

## 2019-06-24 DIAGNOSIS — M961 Postlaminectomy syndrome, not elsewhere classified: Secondary | ICD-10-CM

## 2019-06-24 DIAGNOSIS — I10 Essential (primary) hypertension: Secondary | ICD-10-CM

## 2019-06-24 DIAGNOSIS — F17200 Nicotine dependence, unspecified, uncomplicated: Secondary | ICD-10-CM

## 2019-06-24 DIAGNOSIS — L409 Psoriasis, unspecified: Secondary | ICD-10-CM

## 2019-06-24 DIAGNOSIS — M461 Sacroiliitis, not elsewhere classified: Secondary | ICD-10-CM

## 2019-06-24 DIAGNOSIS — M19041 Primary osteoarthritis, right hand: Secondary | ICD-10-CM

## 2019-06-24 DIAGNOSIS — Z8639 Personal history of other endocrine, nutritional and metabolic disease: Secondary | ICD-10-CM

## 2019-06-24 DIAGNOSIS — M19072 Primary osteoarthritis, left ankle and foot: Secondary | ICD-10-CM

## 2019-06-24 DIAGNOSIS — F4323 Adjustment disorder with mixed anxiety and depressed mood: Secondary | ICD-10-CM

## 2019-06-24 DIAGNOSIS — R778 Other specified abnormalities of plasma proteins: Secondary | ICD-10-CM

## 2019-06-24 DIAGNOSIS — G4700 Insomnia, unspecified: Secondary | ICD-10-CM

## 2019-06-24 DIAGNOSIS — Z8719 Personal history of other diseases of the digestive system: Secondary | ICD-10-CM

## 2019-06-24 DIAGNOSIS — I739 Peripheral vascular disease, unspecified: Secondary | ICD-10-CM

## 2019-06-24 DIAGNOSIS — M19071 Primary osteoarthritis, right ankle and foot: Secondary | ICD-10-CM

## 2019-06-24 DIAGNOSIS — I700 Atherosclerosis of aorta: Secondary | ICD-10-CM

## 2019-06-24 DIAGNOSIS — M19042 Primary osteoarthritis, left hand: Secondary | ICD-10-CM

## 2019-06-24 NOTE — Telephone Encounter (Signed)
This patient has already been approved for Humira.

## 2019-06-24 NOTE — Telephone Encounter (Signed)
Please apply for humira, per Dr. Estanislado Pandy. Thanks!

## 2019-06-24 NOTE — Telephone Encounter (Signed)
I attempted to contact patient and left message on machine to advise patient to call the office to schedule humira new start visit with Luetta Nutting, PharmD.

## 2019-06-25 ENCOUNTER — Telehealth: Payer: Self-pay | Admitting: Rheumatology

## 2019-06-25 NOTE — Telephone Encounter (Signed)
Patient called stating she received the message to call and schedule an appointment with Amber for Humira.  Patient states she is available on Monday, 06/29/19.  Patient states she will be going out of town on Tuesday, 06/30/19 and was hoping to start the medication before she left.  Please advise.

## 2019-06-25 NOTE — Telephone Encounter (Signed)
We can schedule her Monday morning in one of my time slots on the nurse schedule.  If she is not available in the morning will need to discuss with Dr. Estanislado Pandy if she would be able to be squeezed in in the afternoon.  Otherwise she will have to wait until her return. Thanks! Museum/gallery conservator

## 2019-06-25 NOTE — Telephone Encounter (Signed)
°  Last OV (cpe ) with [name] [name] on 06/12/2018 No future OV scheduled

## 2019-06-25 NOTE — Telephone Encounter (Signed)
Spoke with patient and scheduled patient for 06/29/2019 at 9:00 am.

## 2019-06-26 NOTE — Progress Notes (Signed)
Pharmacy Note  Subjective:   Patient presents to clinic today to receive first dose of Humira.  Patient running a fever or have signs/symptoms of infection? No  Patient currently on antibiotics for the treatment of infection? No  Patient have any upcoming invasive procedures/surgeries? No  Objective: CMP     Component Value Date/Time   NA 138 06/02/2019 1100   K 4.4 06/02/2019 1100   CL 102 06/02/2019 1100   CO2 27 06/02/2019 1100   GLUCOSE 93 06/02/2019 1100   BUN 13 06/02/2019 1100   CREATININE 0.80 06/02/2019 1100   CALCIUM 10.0 06/02/2019 1100   PROT 7.1 06/02/2019 1100   PROT 7.0 06/02/2019 1100   ALBUMIN 3.9 06/12/2018 1151   AST 10 06/02/2019 1100   ALT 11 06/02/2019 1100   ALKPHOS 103 06/12/2018 1151   BILITOT 0.4 06/02/2019 1100   GFRNONAA 80 06/02/2019 1100   GFRAA 93 06/02/2019 1100    CBC    Component Value Date/Time   WBC 10.4 06/02/2019 1100   RBC 4.84 06/02/2019 1100   HGB 14.9 06/02/2019 1100   HGB 11.6 05/27/2008 1610   HCT 44.2 06/02/2019 1100   HCT 34.8 05/27/2008 1610   PLT 233 06/02/2019 1100   PLT 317 05/27/2008 1610   MCV 91.3 06/02/2019 1100   MCV 84.6 05/27/2008 1610   MCH 30.8 06/02/2019 1100   MCHC 33.7 06/02/2019 1100   RDW 13.8 06/02/2019 1100   RDW 27.0 (H) 05/27/2008 1610   LYMPHSABS 3,037 06/02/2019 1100   LYMPHSABS 2.6 05/27/2008 1610   MONOABS 0.7 07/26/2017 2320   MONOABS 0.6 05/27/2008 1610   EOSABS 281 06/02/2019 1100   EOSABS 0.2 05/27/2008 1610   BASOSABS 62 06/02/2019 1100   BASOSABS 0.0 05/27/2008 1610    Baseline Immunosuppressant Therapy Labs TB GOLD Quantiferon TB Gold Latest Ref Rng & Units 06/02/2019  Quantiferon TB Gold Plus NEGATIVE NEGATIVE   Hepatitis Panel Hepatitis Latest Ref Rng & Units 06/02/2019  Hep B Surface Ag NON-REACTI NON-REACTIVE  Hep B IgM NON-REACTI NON-REACTIVE  Hep C Ab NEGATIVE -  Hep C Ab NON-REACTI NON-REACTIVE  Hep C Ab NON-REACTI NON-REACTIVE   HIV Lab Results  Component  Value Date   HIV NON-REACTIVE 06/02/2019   HIV NONREACTIVE 04/06/2016   Immunoglobulins Immunoglobulin Electrophoresis Latest Ref Rng & Units 06/02/2019  IgA  47 - 310 mg/dL 186  IgG 600 - 1,640 mg/dL 781  IgM 50 - 300 mg/dL 118   SPEP Serum Protein Electrophoresis Latest Ref Rng & Units 06/02/2019  Total Protein 6.1 - 8.1 g/dL 7.0  Albumin 3.8 - 4.8 g/dL 4.3  Alpha-1 0.2 - 0.3 g/dL 0.3  Alpha-2 0.5 - 0.9 g/dL 0.8  Beta Globulin 0.4 - 0.6 g/dL 0.5  Beta 2 0.2 - 0.5 g/dL 0.3  Gamma Globulin 0.8 - 1.7 g/dL 0.8   G6PD No results found for: G6PDH TPMT No results found for: TPMT   Chest x-ray: no active cardiopulmonary disease 07/26/2017  Assessment/Plan:  Demonstrated proper injection technique with Humira demo pen.  Patient able to demonstrate proper injection technique using the teach back method.  Patient self injected in the right lower abdomen with:  Sample Medication: Humira 40 mg/0.83ml NDC: 1324-4010-27 Lot: [id number] Expiration: 07/2020  Patient tolerated well.  Observed for 30 mins in office for adverse reaction and none noted.   Patient is to return in 1 month for follow up appointment and labs.  Standing orders placed. Prescription sent to [facility] required per insurance  along with co-pay card information.  All questions encouraged and answered.  Instructed patient to call with any further questions or concerns.   Mariella Saa, PharmD, [place], CPP Clinical Specialty Pharmacist (Rheumatology and Pulmonology)  06/29/2019 10:04 AM

## 2019-06-26 NOTE — Telephone Encounter (Signed)
Patient has been added to [employer] Complete Pro, copay card is pending benefits investigation.

## 2019-06-26 NOTE — Telephone Encounter (Signed)
Please notify patient that she is overdue for her physical, needs to schedule. We received refill requests for her medications, I will provide 30-day supply, but she will need to come in for further refills.

## 2019-06-29 ENCOUNTER — Other Ambulatory Visit: Payer: Self-pay

## 2019-06-29 ENCOUNTER — Ambulatory Visit (INDEPENDENT_AMBULATORY_CARE_PROVIDER_SITE_OTHER): Payer: Managed Care, Other (non HMO) | Admitting: Pharmacist

## 2019-06-29 VITALS — BP 141/50 | HR 69

## 2019-06-29 DIAGNOSIS — Z79899 Other long term (current) drug therapy: Secondary | ICD-10-CM

## 2019-06-29 DIAGNOSIS — H209 Unspecified iridocyclitis: Secondary | ICD-10-CM | POA: Diagnosis not present

## 2019-06-29 DIAGNOSIS — L4050 Arthropathic psoriasis, unspecified: Secondary | ICD-10-CM | POA: Diagnosis not present

## 2019-06-29 DIAGNOSIS — Z7189 Other specified counseling: Secondary | ICD-10-CM | POA: Diagnosis not present

## 2019-06-29 MED ORDER — HUMIRA (2 PEN) 40 MG/0.4ML ~~LOC~~ AJKT: 40.0000 mg | AUTO-INJECTOR | SUBCUTANEOUS | 0 refills | Status: DC

## 2019-06-29 NOTE — Patient Instructions (Addendum)
   START Humira 40 mg every 14 days  CALL [facility] to set up first shipment if you have not heard from them by Thursday  Remember the 5 C's:  COUNTER- leave on the counter at least 30 mins but up to overnight to bring medication to room temperature and prevent stinging  COLD- Placing something cold (like and ice gel pack or cold water bottle) on the injection site just before cleansing with alcohol may help reduce pain  CLARITIN- for the first two weeks of treatment or  the day of, the day before, and the day after injecting to minimize injection site reactions  CORTISONE CREAM- apply if injection site is irritated and itching  CALL ME- if injection site reaction is bigger than the size of your fist, looks infected, blisters, or develop hives   Standing Labs We placed an order today for your standing lab work.   Please have your standing labs drawn in 1 month and then every 3 months.  If possible, please have your labs drawn 2 weeks prior to your appointment so that the provider can discuss your results at your appointment.  We have open lab daily Monday through Thursday from 8:30-12:30 PM and 1:30-4:30 PM and Friday from 8:30-12:30 PM and 1:30-4:00 PM at the office of Dr. Bo Merino, [facility].   You may experience shorter wait times on Monday and Friday afternoons. The office is located at [address], [place], [place], [place] [address] No appointment is necessary.   Labs are drawn by [facility].  You may receive a bill from [facility] for your lab work.  If you wish to have your labs drawn at another location, please call the office 24 hours in advance to send orders.  If you have any questions regarding directions or hours of operation,  please call [phone].   As a reminder, please drink plenty of water prior to coming for your lab work. Thanks!

## 2019-07-10 ENCOUNTER — Ambulatory Visit: Payer: 59 | Admitting: Rheumatology

## 2019-07-20 NOTE — Progress Notes (Deleted)
Office Visit Note  Patient: [name] [name]             Date of Birth: [birth year]           MRN: [id number]             PCP: Pleas Koch, NP Referring: Pleas Koch, NP Visit Date: 08/03/2019 Occupation: @GUAROCC @  Subjective:  No chief complaint on file.   History of Present Illness: [name] [name] is a 59 y.o. female ***   Activities of Daily Living:  Patient reports morning stiffness for *** {minute/hour:19697}.   Patient {ACTIONS;DENIES/REPORTS:21021675::"Denies"} nocturnal pain.  Difficulty dressing/grooming: {ACTIONS;DENIES/REPORTS:21021675::"Denies"} Difficulty climbing stairs: {ACTIONS;DENIES/REPORTS:21021675::"Denies"} Difficulty getting out of chair: {ACTIONS;DENIES/REPORTS:21021675::"Denies"} Difficulty using hands for taps, buttons, cutlery, and/or writing: {ACTIONS;DENIES/REPORTS:21021675::"Denies"}  No Rheumatology ROS completed.   PMFS History:  Patient Active Problem List   Diagnosis Date Noted   High risk medication use 06/29/2019   Preventative health care 06/12/2018   Herpes zoster without complication 78/29/5621   Psoriasis 04/06/2016   Atherosclerosis of abdominal aorta ([place]) 07/16/2014   Adjustment disorder with mixed anxiety and depressed mood 07/16/2014   Cervical postlaminectomy syndrome 01/10/2012   Hot flashes 11/02/2011   Chronic fatigue 11/02/2011   Chronic back pain 08/17/2011   COPD (chronic obstructive pulmonary disease) ([place]) 04/21/2010   Allergic rhinitis 07/01/2009   STRESS INCONTINENCE 03/16/2008   PAD (peripheral artery disease) ([place]) 07/10/2007   LEG PAIN, BILATERAL 06/24/2007   Hyperlipidemia 01/01/2007   TOBACCO ABUSE 01/01/2007   INSOMNIA, CHRONIC 01/01/2007   RESTLESS LEG SYNDROME 01/01/2007   Essential hypertension 01/01/2007    Past Medical History:  Diagnosis Date   Atypical chest pain    Back pain    chronic   Chronic insomnia    Depression    Eye abnormality    trouble seeing  out of lt eye   GERD (gastroesophageal reflux disease)    Hyperlipidemia    Hypertension    PVD (peripheral vascular disease) ([place])    segmental femeropoliteal disease   Tobacco abuse     Family History  Problem Relation Age of Onset   Alcohol abuse Father        deceased age 25   Emphysema Mother        deceased age 26   ADD / ADHD Son    Healthy Son    Asthma Sister    ADD / ADHD Son    Healthy Son    Healthy Son    Healthy Daughter    Past Surgical History:  Procedure Laterality Date   BUNIONECTOMY  1996   bilateral   CARDIAC CATHETERIZATION N/A 11/01/2014   Procedure: Left Heart Cath and Coronary Angiography;  Surgeon: Dionisio David, MD;  Location: [facility];  Service: Cardiovascular;  Laterality: N/A;   CERVICAL DISCECTOMY  06/21/2006    and fusion    [place]   ENDOMETRIAL ABLATION  June 2010   LOWER EXTREMITY ANGIOGRAPHY Left 08/05/2017   Procedure: LOWER EXTREMITY ANGIOGRAPHY;  Surgeon: Algernon Huxley, MD;  Location: [facility];  Service: Cardiovascular;  Laterality: Left;   NASAL SINUS SURGERY  1999   TUBAL LIGATION     WRIST SURGERY  2005   right wrist   Social History   Social History Narrative   Divorced with 4 children    Current Smoker 1ppd    No alcohol   No illicit drug use      Occupation: Tax Scientist, forensic  Immunization History  Administered Date(s) Administered   Td 06/21/2003   Tdap 06/12/2018     Objective: Vital Signs: There were no vitals taken for this visit.   Physical Exam   Musculoskeletal Exam: ***  CDAI Exam: CDAI Score: -- Patient Global: --; Provider Global: -- Swollen: --; Tender: -- Joint Exam 08/03/2019   No joint exam has been documented for this visit   There is currently no information documented on the homunculus. Go to the Rheumatology activity and complete the homunculus joint exam.  Investigation: No additional findings.  Imaging: No results  found.  Recent Labs: Lab Results  Component Value Date   WBC 10.4 06/02/2019   HGB 14.9 06/02/2019   PLT 233 06/02/2019   NA 138 06/02/2019   K 4.4 06/02/2019   CL 102 06/02/2019   CO2 27 06/02/2019   GLUCOSE 93 06/02/2019   BUN 13 06/02/2019   CREATININE 0.80 06/02/2019   BILITOT 0.4 06/02/2019   ALKPHOS 103 06/12/2018   AST 10 06/02/2019   ALT 11 06/02/2019   PROT 7.1 06/02/2019   PROT 7.0 06/02/2019   ALBUMIN 3.9 06/12/2018   CALCIUM 10.0 06/02/2019   GFRAA 93 06/02/2019   QFTBGOLDPLUS NEGATIVE 06/02/2019    Speciality Comments: No specialty comments available.  Procedures:  No procedures performed Allergies: Patient has no known allergies.   Assessment / Plan:     Visit Diagnoses: No diagnosis found.  Orders: No orders of the defined types were placed in this encounter.  No orders of the defined types were placed in this encounter.   Face-to-face time spent with patient was *** minutes. Greater than 50% of time was spent in counseling and coordination of care.  Follow-Up Instructions: No follow-ups on file.   [name], PA-C  Note - This record has been created using Dragon software.  Chart creation errors have been sought, but may not always  have been located. Such creation errors do not reflect on  the standard of medical care.

## 2019-07-24 ENCOUNTER — Other Ambulatory Visit: Payer: Self-pay | Admitting: Primary Care

## 2019-07-24 DIAGNOSIS — I10 Essential (primary) hypertension: Secondary | ICD-10-CM

## 2019-07-24 DIAGNOSIS — F4323 Adjustment disorder with mixed anxiety and depressed mood: Secondary | ICD-10-CM

## 2019-07-24 DIAGNOSIS — G4700 Insomnia, unspecified: Secondary | ICD-10-CM

## 2019-07-24 NOTE — Telephone Encounter (Signed)
Please notify patient that she is overdue for an office visit. I will refill her medications for a 30 days supply, but will need to be seen for further refills.  Let me know when she's been scheduled.

## 2019-07-24 NOTE — Telephone Encounter (Signed)
Last prescribed on 06/26/2019 Last OV (cpe ) with Allie Bossier on 06/12/2018  No future OV scheduled

## 2019-07-30 NOTE — Telephone Encounter (Signed)
Attempted to call patient and scheduled for OV. LVM to call back to schedule.

## 2019-07-31 ENCOUNTER — Telehealth: Payer: Self-pay | Admitting: Rheumatology

## 2019-07-31 ENCOUNTER — Ambulatory Visit: Payer: 59 | Admitting: Rheumatology

## 2019-07-31 DIAGNOSIS — Z79899 Other long term (current) drug therapy: Secondary | ICD-10-CM

## 2019-07-31 NOTE — Telephone Encounter (Signed)
Lab Orders faxed

## 2019-07-31 NOTE — Telephone Encounter (Signed)
Patient called requesting her labwork orders be sent to her PCP Dr. Alma Friendly.  Patient states she has appointment scheduled for 08/04/19.

## 2019-07-31 NOTE — Telephone Encounter (Signed)
Refills sent to pharmacy. 

## 2019-07-31 NOTE — Telephone Encounter (Signed)
Patient called in and scheduled visit 08/03/2019.

## 2019-08-03 ENCOUNTER — Ambulatory Visit: Payer: Managed Care, Other (non HMO) | Admitting: Rheumatology

## 2019-08-03 ENCOUNTER — Ambulatory Visit (INDEPENDENT_AMBULATORY_CARE_PROVIDER_SITE_OTHER): Payer: Managed Care, Other (non HMO) | Admitting: Primary Care

## 2019-08-03 ENCOUNTER — Encounter: Payer: Self-pay | Admitting: Primary Care

## 2019-08-03 ENCOUNTER — Ambulatory Visit: Payer: Managed Care, Other (non HMO) | Admitting: Primary Care

## 2019-08-03 VITALS — BP 136/86 | HR 78 | Temp 96.7°F | Ht 59.0 in | Wt 110.2 lb

## 2019-08-03 DIAGNOSIS — I700 Atherosclerosis of aorta: Secondary | ICD-10-CM | POA: Diagnosis not present

## 2019-08-03 DIAGNOSIS — L409 Psoriasis, unspecified: Secondary | ICD-10-CM

## 2019-08-03 DIAGNOSIS — Z1211 Encounter for screening for malignant neoplasm of colon: Secondary | ICD-10-CM

## 2019-08-03 DIAGNOSIS — J449 Chronic obstructive pulmonary disease, unspecified: Secondary | ICD-10-CM

## 2019-08-03 DIAGNOSIS — E785 Hyperlipidemia, unspecified: Secondary | ICD-10-CM

## 2019-08-03 DIAGNOSIS — G4700 Insomnia, unspecified: Secondary | ICD-10-CM

## 2019-08-03 DIAGNOSIS — F17200 Nicotine dependence, unspecified, uncomplicated: Secondary | ICD-10-CM

## 2019-08-03 DIAGNOSIS — I10 Essential (primary) hypertension: Secondary | ICD-10-CM | POA: Diagnosis not present

## 2019-08-03 DIAGNOSIS — F4323 Adjustment disorder with mixed anxiety and depressed mood: Secondary | ICD-10-CM

## 2019-08-03 LAB — COMPREHENSIVE METABOLIC PANEL
ALT: 10 U/L (ref 0–35)
AST: 10 U/L (ref 0–37)
Albumin: 4.4 g/dL (ref 3.5–5.2)
Alkaline Phosphatase: 86 U/L (ref 39–117)
BUN: 21 mg/dL (ref 6–23)
CO2: 28 mEq/L (ref 19–32)
Calcium: 9.8 mg/dL (ref 8.4–10.5)
Chloride: 103 mEq/L (ref 96–112)
Creatinine, Ser: 1.1 mg/dL (ref 0.40–1.20)
GFR: 50.5 mL/min — ABNORMAL LOW (ref >60.00)
Glucose, Bld: 98 mg/dL (ref 70–99)
Potassium: 4.2 mEq/L (ref 3.5–5.1)
Sodium: 137 mEq/L (ref 135–145)
Total Bilirubin: 0.3 mg/dL (ref 0.2–1.2)
Total Protein: 6.8 g/dL (ref 6.0–8.3)

## 2019-08-03 LAB — CBC WITH DIFFERENTIAL/PLATELET
Basophils Absolute: 0 10*3/uL (ref 0.0–0.1)
Basophils Relative: 0.3 % (ref 0.0–3.0)
Eosinophils Absolute: 0.5 10*3/uL (ref 0.0–0.7)
Eosinophils Relative: 4.2 % (ref 0.0–5.0)
HCT: 39.7 % (ref 36.0–46.0)
Hemoglobin: 13.6 g/dL (ref 12.0–15.0)
Lymphocytes Relative: 36.4 % (ref 12.0–46.0)
Lymphs Abs: 4.6 10*3/uL — ABNORMAL HIGH (ref 0.7–4.0)
MCHC: 34.4 g/dL (ref 30.0–36.0)
MCV: 92.1 fl (ref 78.0–100.0)
Monocytes Absolute: 0.7 10*3/uL (ref 0.1–1.0)
Monocytes Relative: 5.2 % (ref 3.0–12.0)
Neutro Abs: 6.8 10*3/uL (ref 1.4–7.7)
Neutrophils Relative %: 53.9 % (ref 43.0–77.0)
Platelets: 212 10*3/uL (ref 150.0–400.0)
RBC: 4.31 Mil/uL (ref 3.87–5.11)
RDW: 14.8 % (ref 11.5–15.5)
WBC: 12.7 10*3/uL — ABNORMAL HIGH (ref 4.0–10.5)

## 2019-08-03 LAB — LIPID PANEL
Cholesterol: 111 mg/dL (ref 0–200)
HDL: 43 mg/dL (ref >39.00)
LDL Cholesterol: 52 mg/dL (ref 0–99)
NonHDL: 67.82
Total CHOL/HDL Ratio: 3
Triglycerides: 77 mg/dL (ref 0.0–149.0)
VLDL: 15.4 mg/dL (ref 0.0–40.0)

## 2019-08-03 MED ORDER — PAROXETINE HCL 40 MG PO TABS: 40.0000 mg | ORAL_TABLET | ORAL | 3 refills | Status: DC

## 2019-08-03 MED ORDER — METOPROLOL TARTRATE 25 MG PO TABS: 25.0000 mg | ORAL_TABLET | Freq: Two times a day (BID) | ORAL | 3 refills | Status: DC

## 2019-08-03 MED ORDER — CLOPIDOGREL BISULFATE 75 MG PO TABS: 75.0000 mg | ORAL_TABLET | Freq: Every day | ORAL | 3 refills | Status: DC

## 2019-08-03 MED ORDER — ATORVASTATIN CALCIUM 10 MG PO TABS: 10.0000 mg | ORAL_TABLET | Freq: Every day | ORAL | 3 refills | Status: DC

## 2019-08-03 NOTE — Patient Instructions (Signed)
Stop by the lab prior to leaving today. I will notify you of your results once received.   Complete the Cologuard Kit once received.  Think about the mammogram as discussed.  It was a pleasure to see you today!

## 2019-08-03 NOTE — Assessment & Plan Note (Signed)
Doing well on paroxetine 40 mg, refills sent to pharmacy.

## 2019-08-03 NOTE — Progress Notes (Signed)
Subjective:    Patient ID: [name] [name], female    DOB: Jul 28, 1960, 59 y.o.   MRN: [id number]  HPI  This visit occurred during the SARS-CoV-2 public health emergency.  Safety protocols were in place, including screening questions prior to the visit, additional usage of staff PPE, and extensive cleaning of exam room while observing appropriate contact time as indicated for disinfecting solutions.   Ms. Blackard is a 59 year old female with a history of hypertension, PAD, atherosclerosis of abdominal aorta, COPD, hyperlipidemia, tobacco abuse, restless leg syndrome, urinary incontinence, insomnia, anxiety and depression who presents today for follow up.  Overall doing well, has been busy with ophthalmology for uveitis and caract removal.   Also following with Rheumatology for psoriasis, was recently initiated on Humira Pen every two weeks. This has helped significantly with psoriasis.   Due for lung cancer screening in June 2022, last completed at [facility].   She never received the Cologuard Kit from last year. She is still not interested in a mammogram.   BP Readings from Last 3 Encounters:  08/03/19 136/86  06/29/19 (!) 141/50  06/24/19 (!) 145/62     Review of Systems  Eyes: Negative for visual disturbance.  Respiratory: Negative for shortness of breath.   Cardiovascular: Negative for chest pain.  Skin: Negative for rash.  Neurological: Negative for dizziness and headaches.       Past Medical History:  Diagnosis Date  . Atypical chest pain   . Back pain    chronic  . Chronic insomnia   . Depression   . Eye abnormality    trouble seeing out of lt eye  . GERD (gastroesophageal reflux disease)   . Hyperlipidemia   . Hypertension   . PVD (peripheral vascular disease) ([place])    segmental femeropoliteal disease  . Tobacco abuse      Social History   Socioeconomic History  . Marital status: Divorced    Spouse name: Not on file  . Number of children: Not on file  .  Years of education: Not on file  . Highest education level: Not on file  Occupational History  . Not on file  Tobacco Use  . Smoking status: Current Every Day Smoker    Packs/day: 1.00    Years: 45.00    Pack years: 45.00    Types: Cigarettes  . Smokeless tobacco: Never Used  Vaping Use  . Vaping Use: Never used  Substance and Sexual Activity  . Alcohol use: No  . Drug use: Never  . Sexual activity: Not on file  Other Topics Concern  . Not on file  Social History Narrative   Divorced with 4 children    Current Smoker 1ppd    No alcohol   No illicit drug use      Occupation: Tax Estate manager/land agent Strain:   . Difficulty of Paying Living Expenses:   Food Insecurity:   . Worried About Charity fundraiser in the Last Year:   . Arboriculturist in the Last Year:   Transportation Needs:   . Film/video editor (Medical):   Marland Kitchen Lack of Transportation (Non-Medical):   Physical Activity:   . Days of Exercise per Week:   . Minutes of Exercise per Session:   Stress:   . Feeling of Stress :   Social Connections:   . Frequency of Communication with Friends and Family:   .  Frequency of Social Gatherings with Friends and Family:   . Attends Religious Services:   . Active Member of Clubs or Organizations:   . Attends Archivist Meetings:   Marland Kitchen Marital Status:   Intimate Partner Violence:   . Fear of Current or Ex-Partner:   . Emotionally Abused:   Marland Kitchen Physically Abused:   . Sexually Abused:     Past Surgical History:  Procedure Laterality Date  . BUNIONECTOMY  1996   bilateral  . CARDIAC CATHETERIZATION N/A 11/01/2014   Procedure: Left Heart Cath and Coronary Angiography;  Surgeon: Dionisio David, MD;  Location: [facility];  Service: Cardiovascular;  Laterality: N/A;  . CERVICAL DISCECTOMY  06/21/2006    and fusion   . CESAREAN SECTION  1990  . ENDOMETRIAL ABLATION  June 2010  . LOWER EXTREMITY  ANGIOGRAPHY Left 08/05/2017   Procedure: LOWER EXTREMITY ANGIOGRAPHY;  Surgeon: Algernon Huxley, MD;  Location: [facility];  Service: Cardiovascular;  Laterality: Left;  . NASAL SINUS SURGERY  1999  . TUBAL LIGATION    . WRIST SURGERY  2005   right wrist    Family History  Problem Relation Age of Onset  . Alcohol abuse Father        deceased age 45  . Emphysema Mother        deceased age 61  . ADD / ADHD Son   . Healthy Son   . Asthma Sister   . ADD / ADHD Son   . Healthy Son   . Healthy Son   . Healthy Daughter     No Known Allergies  Current Outpatient Medications on File Prior to Visit  Medication Sig Dispense Refill  . Adalimumab (HUMIRA PEN) 40 MG/0.4ML PNKT Inject 40 mg into the skin every 14 (fourteen) days. 3 each 0  . albuterol (VENTOLIN HFA) 108 (90 Base) MCG/ACT inhaler Inhale 2 puffs into the lungs every 6 (six) hours as needed for wheezing or shortness of breath. 1 Inhaler 0  . amLODipine (NORVASC) 10 MG tablet Take 1 tablet (10 mg total) by mouth daily. For blood pressure. 30 tablet 0  . fluticasone (FLONASE) 50 MCG/ACT nasal spray Place 1 spray into both nostrils 2 (two) times daily. (Patient not taking: Reported on 06/29/2019) 16 g 0  . naproxen sodium (ALEVE) 220 MG tablet Take 220 mg by mouth 2 (two) times daily as needed.    . prednisoLONE acetate (PRED FORTE) 1 % ophthalmic suspension Place 2 drops into the left eye 4 (four) times daily.     Marland Kitchen zolpidem (AMBIEN) 5 MG tablet TAKE 1 TABLET(5 MG) BY MOUTH AT BEDTIME 30 tablet 0   No current facility-administered medications on file prior to visit.    BP 136/86   Pulse 78   Temp (!) 96.7 F (35.9 C) (Temporal)   Ht '4\' 11"'$  (1.499 m)   Wt 110 lb 4 oz (50 kg)   SpO2 98%   BMI 22.27 kg/m    Objective:   Physical Exam Cardiovascular:     Rate and Rhythm: Normal rate and regular rhythm.  Pulmonary:     Effort: Pulmonary effort is normal.     Breath sounds: Normal breath sounds.  Musculoskeletal:      Cervical back: Neck supple.  Skin:    General: Skin is warm and dry.  Psychiatric:        Mood and Affect: Mood normal.  Assessment & Plan:

## 2019-08-03 NOTE — Assessment & Plan Note (Signed)
Stable. Irregular use of albuterol inhaler. Continue to monitor.

## 2019-08-03 NOTE — Assessment & Plan Note (Addendum)
Improved. Following with Rheumatology, managed on Humira every 2 weeks. Continue same.

## 2019-08-03 NOTE — Assessment & Plan Note (Signed)
Continued. Lung cancer screening UTD. Declines colonoscopy but agrees to Cologuard. She did not receive her kit from last year, will resend.   She declines mammogram despite recommendations.

## 2019-08-03 NOTE — Assessment & Plan Note (Signed)
Doing well on nightly Zolpidem for which she's taken for years. Continue same.

## 2019-08-03 NOTE — Assessment & Plan Note (Signed)
Borderline in the office today, lower readings at other provider offices.  Continue amlodipine and metoprolol. Continue to monitor.

## 2019-08-03 NOTE — Assessment & Plan Note (Signed)
Repeat lipid panel pending. Continue clopidogrel and atorvastatin.

## 2019-08-03 NOTE — Assessment & Plan Note (Signed)
Repeat lipid panel pending. Compliant to clopidogrel and atorvastatin.

## 2019-08-04 ENCOUNTER — Ambulatory Visit: Payer: Managed Care, Other (non HMO) | Admitting: Physician Assistant

## 2019-08-17 ENCOUNTER — Telehealth: Payer: Self-pay | Admitting: Pharmacy Technician

## 2019-08-17 NOTE — Telephone Encounter (Signed)
Submitted a Prior Authorization request to [employer] for [place] via Cover My Meds. Will update once we receive a response.   (KeyClaudette Head) - [id number]

## 2019-08-17 NOTE — Telephone Encounter (Signed)
Received notification from [place] regarding a prior authorization for [place]. Authorization has been APPROVED from 07/18/19 to 08/16/20.   Authorization # [id number]

## 2019-08-24 ENCOUNTER — Other Ambulatory Visit: Payer: Self-pay | Admitting: Primary Care

## 2019-08-24 DIAGNOSIS — G4700 Insomnia, unspecified: Secondary | ICD-10-CM

## 2019-08-25 NOTE — Telephone Encounter (Signed)
Refills sent to pharmacy. 

## 2019-08-25 NOTE — Telephone Encounter (Signed)
Last prescribed on 07/31/2019 Last OV (follow up) with Allie Bossier on 08/03/2019 No future OV scheduled

## 2019-08-26 ENCOUNTER — Other Ambulatory Visit: Payer: Self-pay | Admitting: Primary Care

## 2019-08-26 DIAGNOSIS — I10 Essential (primary) hypertension: Secondary | ICD-10-CM

## 2019-08-27 NOTE — Progress Notes (Deleted)
Office Visit Note  Patient: [name] [name]             Date of Birth: 04/16/1960           MRN: [id number]             PCP: Pleas Koch, NP Referring: Pleas Koch, NP Visit Date: 09/08/2019 Occupation: '@GUAROCC' @  Subjective:  No chief complaint on file.   History of Present Illness: [name] [name] is a 59 y.o. female ***   Activities of Daily Living:  Patient reports morning stiffness for *** {minute/hour:19697}.   Patient {ACTIONS;DENIES/REPORTS:21021675::"Denies"} nocturnal pain.  Difficulty dressing/grooming: {ACTIONS;DENIES/REPORTS:21021675::"Denies"} Difficulty climbing stairs: {ACTIONS;DENIES/REPORTS:21021675::"Denies"} Difficulty getting out of chair: {ACTIONS;DENIES/REPORTS:21021675::"Denies"} Difficulty using hands for taps, buttons, cutlery, and/or writing: {ACTIONS;DENIES/REPORTS:21021675::"Denies"}  No Rheumatology ROS completed.   PMFS History:  Patient Active Problem List   Diagnosis Date Noted  . High risk medication use 06/29/2019  . Preventative health care 06/12/2018  . Psoriasis 04/06/2016  . Atherosclerosis of abdominal aorta ([place]) 07/16/2014  . Adjustment disorder with mixed anxiety and depressed mood 07/16/2014  . Cervical postlaminectomy syndrome 01/10/2012  . Hot flashes 11/02/2011  . Chronic fatigue 11/02/2011  . Chronic back pain 08/17/2011  . COPD (chronic obstructive pulmonary disease) ([place]) 04/21/2010  . Allergic rhinitis 07/01/2009  . STRESS INCONTINENCE 03/16/2008  . PAD (peripheral artery disease) ([place]) 07/10/2007  . LEG PAIN, BILATERAL 06/24/2007  . Hyperlipidemia 01/01/2007  . TOBACCO ABUSE 01/01/2007  . INSOMNIA, CHRONIC 01/01/2007  . RESTLESS LEG SYNDROME 01/01/2007  . Essential hypertension 01/01/2007    Past Medical History:  Diagnosis Date  . Atypical chest pain   . Back pain    chronic  . Chronic insomnia   . Depression   . Eye abnormality    trouble seeing out of lt eye  . GERD (gastroesophageal reflux  disease)   . Herpes zoster without complication 7/65/4650  . Hyperlipidemia   . Hypertension   . PVD (peripheral vascular disease) ([place])    segmental femeropoliteal disease  . Tobacco abuse     Family History  Problem Relation Age of Onset  . Alcohol abuse Father        deceased age 11  . Emphysema Mother        deceased age 74  . ADD / ADHD Son   . Healthy Son   . Asthma Sister   . ADD / ADHD Son   . Healthy Son   . Healthy Son   . Healthy Daughter    Past Surgical History:  Procedure Laterality Date  . BUNIONECTOMY  1996   bilateral  . CARDIAC CATHETERIZATION N/A 11/01/2014   Procedure: Left Heart Cath and Coronary Angiography;  Surgeon: Dionisio David, MD;  Location: [facility];  Service: Cardiovascular;  Laterality: N/A;  . CERVICAL DISCECTOMY  06/21/2006    and fusion   . CESAREAN SECTION  1990  . ENDOMETRIAL ABLATION  June 2010  . LOWER EXTREMITY ANGIOGRAPHY Left 08/05/2017   Procedure: LOWER EXTREMITY ANGIOGRAPHY;  Surgeon: Algernon Huxley, MD;  Location: [facility];  Service: Cardiovascular;  Laterality: Left;  . NASAL SINUS SURGERY  1999  . TUBAL LIGATION    . WRIST SURGERY  2005   right wrist   Social History   Social History Narrative   Divorced with 4 children    Current Smoker 1ppd    No alcohol   No illicit drug use      Occupation: Tax Scientist, forensic  Immunization History  Administered Date(s) Administered  . Td 06/21/2003  . Tdap 06/12/2018     Objective: Vital Signs: There were no vitals taken for this visit.   Physical Exam   Musculoskeletal Exam: ***  CDAI Exam: CDAI Score: -- Patient Global: --; Provider Global: -- Swollen: --; Tender: -- Joint Exam 09/08/2019   No joint exam has been documented for this visit   There is currently no information documented on the homunculus. Go to the Rheumatology activity and complete the homunculus joint exam.  Investigation: No additional findings.  Imaging: No results  found.  Recent Labs: Lab Results  Component Value Date   WBC 12.7 (H) 08/03/2019   HGB 13.6 08/03/2019   PLT 212.0 08/03/2019   NA 137 08/03/2019   K 4.2 08/03/2019   CL 103 08/03/2019   CO2 28 08/03/2019   GLUCOSE 98 08/03/2019   BUN 21 08/03/2019   CREATININE 1.10 08/03/2019   BILITOT 0.3 08/03/2019   ALKPHOS 86 08/03/2019   AST 10 08/03/2019   ALT 10 08/03/2019   PROT 6.8 08/03/2019   ALBUMIN 4.4 08/03/2019   CALCIUM 9.8 08/03/2019   GFRAA 93 06/02/2019   QFTBGOLDPLUS NEGATIVE 06/02/2019    Speciality Comments: No specialty comments available.  Procedures:  No procedures performed Allergies: Patient has no known allergies.   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  No orders of the defined types were placed in this encounter.   Face-to-face time spent with patient was *** minutes. Greater than 50% of time was spent in counseling and coordination of care.  Follow-Up Instructions: No follow-ups on file.   [name], PA-C  Note - This record has been created using Dragon software.  Chart creation errors have been sought, but may not always  have been located. Such creation errors do not reflect on  the standard of medical care.

## 2019-09-08 ENCOUNTER — Ambulatory Visit: Payer: Managed Care, Other (non HMO) | Admitting: Physician Assistant

## 2019-09-12 ENCOUNTER — Other Ambulatory Visit: Payer: Self-pay | Admitting: Pharmacist

## 2019-09-12 DIAGNOSIS — H209 Unspecified iridocyclitis: Secondary | ICD-10-CM

## 2019-09-12 DIAGNOSIS — L4050 Arthropathic psoriasis, unspecified: Secondary | ICD-10-CM

## 2019-09-14 NOTE — Telephone Encounter (Signed)
Last Visit: 06/24/19 Next Visit: 10/22/19 Labs: 08/03/19 TB Gold: 06/02/19  Patient started Humira in June 2021 for PsA and uveitis.  Patient eligible for refill as she is up to date with labs and follow-up visits.   Mariella Saa, PharmD, [place], CPP Clinical Specialty Pharmacist (Rheumatology and Pulmonology)  09/14/2019 8:47 AM

## 2019-09-16 DIAGNOSIS — G4700 Insomnia, unspecified: Secondary | ICD-10-CM

## 2019-10-09 NOTE — Progress Notes (Signed)
Office Visit Note  Patient: [name] [name]             Date of Birth: January 01, 1961           MRN: [id number]             PCP: Pleas Koch, NP Referring: Pleas Koch, NP Visit Date: 10/21/2019 Occupation: '@GUAROCC' @  Subjective:  SI joint pain bilaterally   History of Present Illness: [name] [name] is a 59 y.o. female with history of psoriatic arthritis, uveitis, and osteoarthritis.  She is on Humira 40 mg sq injections every 14 days (started on 06/29/19). She has not missed any doses recently.  She denies any recent infections.  She has been experiencing increased discomfort in both SI joints, left greater than right. She states last week she was having difficulty walking due to the severity of pain.  She currently rates the pain a 2/10. She has also noticed new patches of psoriasis on her left foot and in the gluteal crease.  She has not been using a topical agent recently. She denies any recent uveitis flares. She has not needed to use prednisone eye drops recently.        Activities of Daily Living:  Patient reports morning stiffness for 1 hour.   Patient Denies nocturnal pain.  Difficulty dressing/grooming: Denies Difficulty climbing stairs: Denies Difficulty getting out of chair: Denies Difficulty using hands for taps, buttons, cutlery, and/or writing: Reports  Review of Systems  Constitutional: Positive for fatigue.  HENT: Positive for nose dryness. Negative for mouth sores and mouth dryness.   Eyes: Negative for pain, visual disturbance and dryness.  Respiratory: Negative for cough, hemoptysis, shortness of breath and difficulty breathing.   Cardiovascular: Positive for hypertension. Negative for chest pain, palpitations and swelling in legs/feet.  Gastrointestinal: Negative for blood in stool, constipation and diarrhea.  Endocrine: Negative for increased urination.  Genitourinary: Negative for painful urination.  Musculoskeletal: Positive for arthralgias, joint  pain and morning stiffness. Negative for joint swelling, myalgias, muscle weakness, muscle tenderness and myalgias.  Skin: Positive for rash and redness. Negative for color change, pallor, hair loss, nodules/bumps, skin tightness, ulcers and sensitivity to sunlight.  Allergic/Immunologic: Negative for susceptible to infections.  Neurological: Negative for dizziness, numbness, headaches and weakness.  Hematological: Negative for swollen glands.  Psychiatric/Behavioral: Positive for sleep disturbance. Negative for depressed mood. The patient is nervous/anxious.     PMFS History:  Patient Active Problem List   Diagnosis Date Noted  . High risk medication use 06/29/2019  . Preventative health care 06/12/2018  . Psoriasis 04/06/2016  . Atherosclerosis of abdominal aorta ([place]) 07/16/2014  . Adjustment disorder with mixed anxiety and depressed mood 07/16/2014  . Cervical postlaminectomy syndrome 01/10/2012  . Hot flashes 11/02/2011  . Chronic fatigue 11/02/2011  . Chronic back pain 08/17/2011  . COPD (chronic obstructive pulmonary disease) ([place]) 04/21/2010  . Allergic rhinitis 07/01/2009  . STRESS INCONTINENCE 03/16/2008  . PAD (peripheral artery disease) ([place]) 07/10/2007  . LEG PAIN, BILATERAL 06/24/2007  . Hyperlipidemia 01/01/2007  . TOBACCO ABUSE 01/01/2007  . INSOMNIA, CHRONIC 01/01/2007  . RESTLESS LEG SYNDROME 01/01/2007  . Essential hypertension 01/01/2007    Past Medical History:  Diagnosis Date  . Atypical chest pain   . Back pain    chronic  . Chronic insomnia   . Depression   . Eye abnormality    trouble seeing out of lt eye  . GERD (gastroesophageal reflux disease)   . Herpes zoster  without complication 6/80/3212  . Hyperlipidemia   . Hypertension   . PVD (peripheral vascular disease) ([place])    segmental femeropoliteal disease  . Tobacco abuse     Family History  Problem Relation Age of Onset  . Alcohol abuse Father        deceased age 61  . Emphysema Mother          deceased age 74  . ADD / ADHD Son   . Healthy Son   . Asthma Sister   . ADD / ADHD Son   . Healthy Son   . Healthy Son   . Healthy Daughter    Past Surgical History:  Procedure Laterality Date  . BUNIONECTOMY  1996   bilateral  . CARDIAC CATHETERIZATION N/A 11/01/2014   Procedure: Left Heart Cath and Coronary Angiography;  Surgeon: Dionisio David, MD;  Location: [facility];  Service: Cardiovascular;  Laterality: N/A;  . CERVICAL DISCECTOMY  06/21/2006    and fusion   . CESAREAN SECTION  1990  . ENDOMETRIAL ABLATION  June 2010  . LOWER EXTREMITY ANGIOGRAPHY Left 08/05/2017   Procedure: LOWER EXTREMITY ANGIOGRAPHY;  Surgeon: Algernon Huxley, MD;  Location: [facility];  Service: Cardiovascular;  Laterality: Left;  . NASAL SINUS SURGERY  1999  . TUBAL LIGATION    . WRIST SURGERY  2005   right wrist   Social History   Social History Narrative   Divorced with 4 children    Current Smoker 1ppd    No alcohol   No illicit drug use      Occupation: Tax Surveyor, quantity History  Administered Date(s) Administered  . Td 06/21/2003  . Tdap 06/12/2018     Objective: Vital Signs: BP (!) 195/62 (BP Location: Left Arm, Patient Position: Sitting, Cuff Size: Small)   Pulse 73   Ht '4\' 11"'  (1.499 m)   Wt 110 lb (49.9 kg)   BMI 22.22 kg/m    Physical Exam Vitals and nursing note reviewed.  Constitutional:      Appearance: She is well-developed.  HENT:     Head: Normocephalic and atraumatic.  Eyes:     Conjunctiva/sclera: Conjunctivae normal.  Pulmonary:     Effort: Pulmonary effort is normal.  Abdominal:     Palpations: Abdomen is soft.  Musculoskeletal:     Cervical back: Normal range of motion.  Skin:    General: Skin is warm and dry.     Capillary Refill: Capillary refill takes less than 2 seconds.     Comments: Psoriasis on the plantar aspect of the left foot.  Scattered patches of psoriasis on the dorsal aspect of both feet. 1 small  patch of psoriasis on the extensor surface of the left elbow. Psoriasis in gluteal crease.  Fingernail pitting noted.   Neurological:     Mental Status: She is alert and oriented to person, place, and time.  Psychiatric:        Behavior: Behavior normal.      Musculoskeletal Exam:  C-spine, thoracic spine, and lumbar spine good ROM.  No midline spinal tenderness.  Tenderness over both SI joints.  Shoulder joints, elbow joints, wrist joints, MCPs, PIPs, and DIPs good ROM with no synovitis.  Complete fist formation bilaterally.  Hip joints, knee joints, and ankle joints good ROM with no discomfort.  No warmth or effusion of knee joints.  No tenderness or swelling of ankle joints.  No tenderness along the  achilles tendons or plantar fascia.  Tenderness of bilateral 1st MTP joints.    CDAI Exam: CDAI Score: -- Patient Global: --; Provider Global: -- Swollen: 0 ; Tender: 4  Joint Exam 10/21/2019      Right  Left  Sacroiliac   Tender   Tender  MTP 1   Tender   Tender     Investigation: No additional findings.  Imaging: No results found.  Recent Labs: Lab Results  Component Value Date   WBC 12.7 (H) 08/03/2019   HGB 13.6 08/03/2019   PLT 212.0 08/03/2019   NA 137 08/03/2019   K 4.2 08/03/2019   CL 103 08/03/2019   CO2 28 08/03/2019   GLUCOSE 98 08/03/2019   BUN 21 08/03/2019   CREATININE 1.10 08/03/2019   BILITOT 0.3 08/03/2019   ALKPHOS 86 08/03/2019   AST 10 08/03/2019   ALT 10 08/03/2019   PROT 6.8 08/03/2019   ALBUMIN 4.4 08/03/2019   CALCIUM 9.8 08/03/2019   GFRAA 93 06/02/2019   QFTBGOLDPLUS NEGATIVE 06/02/2019    Speciality Comments: No specialty comments available.  Procedures:  No procedures performed Allergies: Patient has no known allergies.   Assessment / Plan:     Visit Diagnoses: Psoriatic arthropathy ([place]) - History of multiple arthralgias, sacroiliitis, uveitis, psoriasis, HLA-B27 positive, family history of psoriasis: She presents today with  ongoing discomfort in both SI joints.  She has tenderness palpation on examination today.  According to the patient her pain was so severe last week that she was having difficulty walking.  She has been taking Aleve very sparingly for pain relief.  She is currently on Humira 40 mg subcutaneous injections every 14 days which was started on 06/29/2019.  She has not had any uveitis flares since starting on Humira and has not required any prednisone eyedrops recently.  She has started to notice new patches of psoriasis on the plantar aspect of her left foot and in the gluteal crease.  Ongoing fingernail pitting was noted on examination today.  She is not experiencing any Achilles tendinitis or plantar fasciitis.  She has no synovitis or dactylitis on examination today.  Different treatment options today were discussed for management of psoriasis as well as ongoing SI joint pain.  She is not a good candidate for methotrexate due to low GFR.  The indications, contraindications, potential side effects of Arava were discussed today in detail.  All questions were addressed and consent was obtained.  Prescription for Jolee Ewing is pending lab results.  Orders for CBC and CMP were released today.  She will take Arava 10 mg by mouth daily x2 weeks and if labs are stable at that time she will increase to 20 mg by mouth daily.  She will continue on Humira as prescribed.  She was advised to notify us if she cannot tolerate taking Arava.  She will follow-up in the office in 6 weeks to assess her response. If her SI joint pain persists or worsens we will try increasing the frequency of humira dosing to every week for 6-8 weeks.   Medication counseling:   Baseline Immunosuppressant Therapy Labs  Quantiferon TB Gold Latest Ref Rng & Units 06/02/2019  Quantiferon TB Gold Plus NEGATIVE NEGATIVE    Hepatitis Latest Ref Rng & Units 06/02/2019  Hep B Surface Ag NON-REACTI NON-REACTIVE  Hep B IgM NON-REACTI NON-REACTIVE  Hep C Ab  NEGATIVE -  Hep C Ab NON-REACTI NON-REACTIVE  Hep C Ab NON-REACTI NON-REACTIVE    Lab Results  Component  Value Date   HIV NON-REACTIVE 06/02/2019   HIV NONREACTIVE 04/06/2016    Immunoglobulin Electrophoresis Latest Ref Rng & Units 06/02/2019  IgA  47 - 310 mg/dL 186  IgG 600 - 1,640 mg/dL 781  IgM 50 - 300 mg/dL 118    Serum Protein Electrophoresis Latest Ref Rng & Units 08/03/2019  Total Protein 6.0 - 8.3 g/dL 6.8  Albumin 3.8 - 4.8 g/dL -  Alpha-1 0.2 - 0.3 g/dL -  Alpha-2 0.5 - 0.9 g/dL -  Beta Globulin 0.4 - 0.6 g/dL -  Beta 2 0.2 - 0.5 g/dL -  Gamma Globulin 0.8 - 1.7 g/dL -    No results found for: G6PDH  No results found for: TPMT   Patient was counseled on the purpose, proper use, and adverse effects of leflunomide including risk of infection, nausea/diarrhea/weight loss, increase in blood pressure, rash, hair loss, tingling in the hands and feet, and signs and symptoms of interstitial lung disease.   Also counseled on [place] warning of liver injury and importance of avoiding alcohol while on therapy. Discussed that there is the possibility of an increased risk of malignancy but it is not well understood if this increased risk is due to the medication or the disease state.  Counseled patient to avoid live vaccines. Recommend annual influenza, Pneumovax 23, Prevnar 13, and Shingrix as indicated.   Discussed the importance of frequent monitoring of liver function and blood count.  Standing orders placed.  Discussed importance of birth control while on leflunomide due to risk of congenital abnormalities, and patient confirms she is postmenopausal.  Provided patient with educational materials on leflunomide and answered all questions.  Patient consented to [place] use, and consent will be uploaded into the media tab.   Patient dose will be 10 mg x2 weeks and if labs are stable she will increase to 20 mg daily.  Prescription pending lab results and/or insurance  approval.  Uveitis: History of recurrent uveitis for last 2 years.  She is followed by Dr. Alanda Slim at [facility]mology.  She has not had any uveitis flares since starting on Humira on 06/29/19.  She has not required prednisone eyedrops recently.   High risk medication use - She will starting on Arava 10 mg 1 tablet daily x2 weeks and if labs are stable at that time she will increase to 20 mg daily.  She will continue on Humira 40 mg sq injections once every 14 days-started on 06/29/19.  CBC and CMP were drawn on 08/03/19.  We will update CBC and CMP today while she is in the office.  She will then update lab work in 2 weeks x2 then every 3 months. TB gold was negative on 06/02/19 and will continue to be monitored yearly. Plan: CBC with Differential/Platelet, COMPLETE METABOLIC PANEL WITH GFR She has not had any recent infections.  She has not received the COVID-19 vaccination and does not plan to.  We discussed that ACR does recommend proceeding with the COVID-19 vaccination as recommended by the CDC as well.  She was advised to notify us or her PCP if she develops a COVID-19 infection in order to receive the monoclonal antibody infusion.  She voiced understanding.  She was encouraged to continue to wear a mask and social distance.  We discussed the importance of holding Humira and Arava if she develops signs or symptoms of an infection and to resume once the infection has completely cleared.  Sacroiliitis ([place]) -  MRI of her hip from  December 21, 2013 which showed bilateral sacroiliitis. She has tenderness to palpation over both SI joints.  She will be starting on arava as discussed above.  She will continue on humira as prescribed.  If her SI joint pain persists or worsens we will try increasing the frequency of humira dosing to every week for 6-8 weeks.   Psoriasis - Skin x confirmed psoriasis at [facility]. She has psoriasis on the plantar aspect of the left foot and scattered patches on the  dorsal aspect of both feet.  Patches present in the gluteal crease. A prescription for clobetasol cream was sent to the pharmacy today.  She will also be starting on [place] in combination with humira.   HLA B27 (HLA B27 positive)  Primary osteoarthritis of both hands: She has PIP and DIP thickening consistent with osteoarthritis of both hands.  No tenderness or inflammation was noted on exam.  Primary osteoarthritis of both feet: She has tenderness of bilateral 1st MTP joints.  No synovitis or dactylitis noted on exam.   Abnormal SPEP: Referred to hematology.  Evaluated on 06/15/19 by Dr. Janese Banks.   Other medical conditions are listed as follows:   Family history of psoriasis  Cervical postlaminectomy syndrome  Chronic obstructive pulmonary disease, unspecified COPD type ([place])  Smoker  Essential hypertension  PAD (peripheral artery disease) ([place])  Atherosclerosis of abdominal aorta ([place])  History of hyperlipidemia  History of gastroesophageal reflux (GERD)  Adjustment disorder with mixed anxiety and depressed mood  Orders: Orders Placed This Encounter  Procedures  . CBC with Differential/Platelet  . COMPLETE METABOLIC PANEL WITH GFR   Meds ordered this encounter  Medications  . clobetasol cream (TEMOVATE) 0.05 %    Sig: Apply 1 application topically 2 (two) times daily.    Dispense:  30 g    Refill:  0      Follow-Up Instructions: Return in about 6 weeks (around 12/02/2019) for Psoriatic arthritis, Osteoarthritis.   [name], PA-C  Note - This record has been created using Dragon software.  Chart creation errors have been sought, but may not always  have been located. Such creation errors do not reflect on  the standard of medical care.

## 2019-10-12 MED ORDER — ZOLPIDEM TARTRATE 10 MG PO TABS: 10.0000 mg | ORAL_TABLET | Freq: Every evening | ORAL | 0 refills | Status: DC | PRN

## 2019-10-21 ENCOUNTER — Encounter: Payer: Self-pay | Admitting: Physician Assistant

## 2019-10-21 ENCOUNTER — Other Ambulatory Visit: Payer: Self-pay

## 2019-10-21 ENCOUNTER — Ambulatory Visit (INDEPENDENT_AMBULATORY_CARE_PROVIDER_SITE_OTHER): Payer: Managed Care, Other (non HMO) | Admitting: Physician Assistant

## 2019-10-21 VITALS — BP 195/62 | HR 73 | Ht 59.0 in | Wt 110.0 lb

## 2019-10-21 DIAGNOSIS — Z8639 Personal history of other endocrine, nutritional and metabolic disease: Secondary | ICD-10-CM

## 2019-10-21 DIAGNOSIS — Z840 Family history of diseases of the skin and subcutaneous tissue: Secondary | ICD-10-CM

## 2019-10-21 DIAGNOSIS — I10 Essential (primary) hypertension: Secondary | ICD-10-CM

## 2019-10-21 DIAGNOSIS — R778 Other specified abnormalities of plasma proteins: Secondary | ICD-10-CM

## 2019-10-21 DIAGNOSIS — Z79899 Other long term (current) drug therapy: Secondary | ICD-10-CM | POA: Diagnosis not present

## 2019-10-21 DIAGNOSIS — F4323 Adjustment disorder with mixed anxiety and depressed mood: Secondary | ICD-10-CM

## 2019-10-21 DIAGNOSIS — I739 Peripheral vascular disease, unspecified: Secondary | ICD-10-CM

## 2019-10-21 DIAGNOSIS — Z1589 Genetic susceptibility to other disease: Secondary | ICD-10-CM

## 2019-10-21 DIAGNOSIS — H209 Unspecified iridocyclitis: Secondary | ICD-10-CM | POA: Diagnosis not present

## 2019-10-21 DIAGNOSIS — L409 Psoriasis, unspecified: Secondary | ICD-10-CM

## 2019-10-21 DIAGNOSIS — M19071 Primary osteoarthritis, right ankle and foot: Secondary | ICD-10-CM

## 2019-10-21 DIAGNOSIS — M961 Postlaminectomy syndrome, not elsewhere classified: Secondary | ICD-10-CM

## 2019-10-21 DIAGNOSIS — M461 Sacroiliitis, not elsewhere classified: Secondary | ICD-10-CM | POA: Diagnosis not present

## 2019-10-21 DIAGNOSIS — Z8719 Personal history of other diseases of the digestive system: Secondary | ICD-10-CM

## 2019-10-21 DIAGNOSIS — M19042 Primary osteoarthritis, left hand: Secondary | ICD-10-CM

## 2019-10-21 DIAGNOSIS — M19041 Primary osteoarthritis, right hand: Secondary | ICD-10-CM

## 2019-10-21 DIAGNOSIS — F17200 Nicotine dependence, unspecified, uncomplicated: Secondary | ICD-10-CM

## 2019-10-21 DIAGNOSIS — L4050 Arthropathic psoriasis, unspecified: Secondary | ICD-10-CM | POA: Diagnosis not present

## 2019-10-21 DIAGNOSIS — I700 Atherosclerosis of aorta: Secondary | ICD-10-CM

## 2019-10-21 DIAGNOSIS — M19072 Primary osteoarthritis, left ankle and foot: Secondary | ICD-10-CM

## 2019-10-21 DIAGNOSIS — J449 Chronic obstructive pulmonary disease, unspecified: Secondary | ICD-10-CM

## 2019-10-21 MED ORDER — CLOBETASOL PROPIONATE 0.05 % EX CREA: 1.0000 "application " | TOPICAL_CREAM | Freq: Two times a day (BID) | CUTANEOUS | 0 refills | Status: DC

## 2019-10-21 NOTE — Patient Instructions (Addendum)
Standing Labs We placed an order today for your standing lab work.   Please have your standing labs drawn in 2 weeks x2 then every 3 months   If possible, please have your labs drawn 2 weeks prior to your appointment so that the provider can discuss your results at your appointment.  We have open lab daily Monday through Thursday from 8:30-12:30 PM and 1:30-4:30 PM and Friday from 8:30-12:30 PM and 1:30-4:00 PM at the office of Dr. Bo Merino, [facility].   Please be advised, patients with office appointments requiring lab work will take precedents over walk-in lab work.  If possible, please come for your lab work on Monday and Friday afternoons, as you may experience shorter wait times. The office is located at [address] [place], [place], [place] [address] No appointment is necessary.   Labs are drawn by [facility]. Please bring your co-pay at the time of your lab draw.  You may receive a bill from [facility] for your lab work.  If you wish to have your labs drawn at another location, please call the office 24 hours in advance to send orders.  If you have any questions regarding directions or hours of operation,  please call [phone].   As a reminder, please drink plenty of water prior to coming for your lab work. Thanks!    COVID-19 vaccine recommendations:   COVID-19 vaccine is recommended for everyone (unless you are allergic to a vaccine component), even if you are on a medication that suppresses your immune system.   If you are on Methotrexate, Cellcept (mycophenolate), Rinvoq, Morrie Sheldon, and Olumiant- hold the medication for 1 week after each vaccine. Hold Methotrexate for 2 weeks after the single dose COVID-19 vaccine.   If you are on Orencia subcutaneous injection - hold medication one week prior to and one week after the first COVID-19 vaccine dose (only).   If you are on Orencia IV infusions- time vaccination administration so that the first COVID-19  vaccination will occur four weeks after the infusion and postpone the subsequent infusion by one week.   If you are on Cyclophosphamide or Rituxan infusions please contact your doctor prior to receiving the COVID-19 vaccine.   Do not take Tylenol or any anti-inflammatory medications (NSAIDs) 24 hours prior to the COVID-19 vaccination.   There is no direct evidence about the efficacy of the COVID-19 vaccine in individuals who are on medications that suppress the immune system.   Even if you are fully vaccinated, and you are on any medications that suppress your immune system, please continue to wear a mask, maintain at least six feet social distance and practice hand hygiene.   If you develop a COVID-19 infection, please contact your PCP or our office to determine if you need antibody infusion.  The booster vaccine is now available for immunocompromised patients. It is advised that if you had Pfizer vaccine you should get Coca-Cola booster.  If you had a Moderna vaccine then you should get a Moderna booster. Johnson and Wynetta Emery does not have a booster vaccine at this time.  Please see the following web sites for updated information.   https://www.rheumatology.org/Portals/0/Files/COVID-19-Vaccination-Patient-Resources.pdf  https://www.rheumatology.org/About-Us/Newsroom/Press-Releases/ID/1159   Leflunomide tablets What is this medicine? LEFLUNOMIDE (le FLOO na mide) is for rheumatoid arthritis. This medicine may be used for other purposes; ask your health care provider or pharmacist if you have questions. COMMON BRAND NAME(S): Arava What should I tell my health care provider before I take this medicine? They need to  know if you have any of these conditions:  diabetes  have a fever or infection  high blood pressure  immune system problems  kidney disease  liver disease  low blood cell counts, like low white cell, platelet, or red cell counts  lung or breathing disease, like  asthma  recently received or scheduled to receive a vaccine  receiving treatment for cancer  skin conditions or sensitivity  tingling of the fingers or toes, or other nerve disorder  tuberculosis  an unusual or allergic reaction to leflunomide, teriflunomide, other medicines, food, dyes, or preservatives  pregnant or trying to get pregnant  breast-feeding How should I use this medicine? Take this medicine by mouth with a full glass of water. Follow the directions on the prescription label. Take your medicine at regular intervals. Do not take your medicine more often than directed. Do not stop taking except on your doctor's advice. Talk to your pediatrician regarding the use of this medicine in children. Special care may be needed. Overdosage: If you think you have taken too much of this medicine contact a poison control center or emergency room at once. NOTE: This medicine is only for you. Do not share this medicine with others. What if I miss a dose? If you miss a dose, take it as soon as you can. If it is almost time for your next dose, take only that dose. Do not take double or extra doses. What may interact with this medicine? Do not take this medicine with any of the following medications:  teriflunomide This medicine may also interact with the following medications:  alosetron  birth control pills  caffeine  cefaclor  certain medicines for diabetes like nateglinide, repaglinide, rosiglitazone, pioglitazone  certain medicines for high cholesterol like atorvastatin, pravastatin, rosuvastatin, simvastatin  charcoal  cholestyramine  ciprofloxacin  duloxetine  furosemide  ketoprofen  live virus vaccines  medicines that increase your risk for infection  methotrexate  mitoxantrone  paclitaxel  penicillin  theophylline  tizanidine  warfarin This list may not describe all possible interactions. Give your health care provider a list of all the  medicines, herbs, non-prescription drugs, or dietary supplements you use. Also tell them if you smoke, drink alcohol, or use illegal drugs. Some items may interact with your medicine. What should I watch for while using this medicine? Visit your health care provider for regular checks on your progress. Tell your doctor or health care provider if your symptoms do not start to get better or if they get worse. You may need blood work done while you are taking this medicine. This medicine may cause serious skin reactions. They can happen weeks to months after starting the medicine. Contact your health care provider right away if you notice fevers or flu-like symptoms with a rash. The rash may be red or purple and then turn into blisters or peeling of the skin. Or, you might notice a red rash with swelling of the face, lips or lymph nodes in your neck or under your arms. This medicine may stay in your body for up to 2 years after your last dose. Tell your doctor about any unusual side effects or symptoms. A medicine can be given to help lower your blood levels of this medicine more quickly. Women must use effective birth control with this medicine. There is a potential for serious side effects to an unborn child. Do not become pregnant while taking this medicine. Inform your doctor if you wish to become pregnant. This medicine  remains in your blood after you stop taking it. You must continue using effective birth control until the blood levels have been checked and they are low enough. A medicine can be given to help lower your blood levels of this medicine more quickly. Immediately talk to your doctor if you think you may be pregnant. You may need a pregnancy test. Talk to your health care provider or pharmacist for more information. You should not receive certain vaccines during your treatment and for a certain time after your treatment with this medication ends. Talk to your health care provider for more  information. What side effects may I notice from receiving this medicine? Side effects that you should report to your doctor or health care professional as soon as possible:  allergic reactions like skin rash, itching or hives, swelling of the face, lips, or tongue  breathing problems  cough  increased blood pressure  low blood counts - this medicine may decrease the number of white blood cells and platelets. You may be at increased risk for infections and bleeding.  pain, tingling, numbness in the hands or feet  rash, fever, and swollen lymph nodes  redness, blistering, peeing or loosening of the skin, including inside the mouth  signs of decreased platelets or bleeding - bruising, pinpoint red spots on the skin, black, tarry stools, blood in urine  signs of infection - fever or chills, cough, sore throat, pain or trouble passing urine  signs and symptoms of liver injury like dark yellow or brown urine; general ill feeling or flu-like symptoms; light-colored stools; loss of appetite; nausea; right upper belly pain; unusually weak or tired; yellowing of the eyes or skin  trouble passing urine or change in the amount of urine  vomiting Side effects that usually do not require medical attention (report to your doctor or health care professional if they continue or are bothersome):  diarrhea  hair thinning or loss  headache  nausea  tiredness This list may not describe all possible side effects. Call your doctor for medical advice about side effects. You may report side effects to FDA at 1-800-FDA-1088. Where should I keep my medicine? Keep out of the reach of children. Store at room temperature between 15 and 30 degrees C (59 and 86 degrees F). Protect from moisture and light. Throw away any unused medicine after the expiration date. NOTE: This sheet is a summary. It may not cover all possible information. If you have questions about this medicine, talk to your doctor,  pharmacist, or health care provider.  2020 Elsevier/Gold Standard (2018-03-21 15:06:48)

## 2019-10-21 NOTE — Telephone Encounter (Signed)
Pending lab results, patient will be starting arava per Hazel Sams, PA-C.   Consent obtained and sent to the scan center.

## 2019-10-22 LAB — COMPLETE METABOLIC PANEL WITH GFR
AG Ratio: 2.1 (calc) (ref 1.0–2.5)
ALT: 11 U/L (ref 6–29)
AST: 12 U/L (ref 10–35)
Albumin: 4.2 g/dL (ref 3.6–5.1)
Alkaline phosphatase (APISO): 89 U/L (ref 37–153)
BUN: 12 mg/dL (ref 7–25)
CO2: 24 mmol/L (ref 20–32)
Calcium: 9.7 mg/dL (ref 8.6–10.4)
Chloride: 103 mmol/L (ref 98–110)
Creat: 0.87 mg/dL (ref 0.50–0.99)
GFR, Est African American: 83 mL/min/{1.73_m2} (ref >=60)
GFR, Est Non African American: 72 mL/min/{1.73_m2} (ref >=60)
Globulin: 2 g/dL (calc) (ref 1.9–3.7)
Glucose, Bld: 93 mg/dL (ref 65–99)
Potassium: 4.8 mmol/L (ref 3.5–5.3)
Sodium: 137 mmol/L (ref 135–146)
Total Bilirubin: 0.5 mg/dL (ref 0.2–1.2)
Total Protein: 6.2 g/dL (ref 6.1–8.1)

## 2019-10-22 LAB — CBC WITH DIFFERENTIAL/PLATELET
Absolute Monocytes: 708 cells/uL (ref 200–950)
Basophils Absolute: 59 cells/uL (ref 0–200)
Basophils Relative: 0.5 %
Eosinophils Absolute: 189 cells/uL (ref 15–500)
Eosinophils Relative: 1.6 %
HCT: 41.7 % (ref 35.0–45.0)
Hemoglobin: 14.5 g/dL (ref 11.7–15.5)
Lymphs Abs: 2879 cells/uL (ref 850–3900)
MCH: 31.9 pg (ref 27.0–33.0)
MCHC: 34.8 g/dL (ref 32.0–36.0)
MCV: 91.6 fL (ref 80.0–100.0)
MPV: 10.4 fL (ref 7.5–12.5)
Monocytes Relative: 6 %
Neutro Abs: 7965 cells/uL — ABNORMAL HIGH (ref 1500–7800)
Neutrophils Relative %: 67.5 %
Platelets: 216 10*3/uL (ref 140–400)
RBC: 4.55 10*6/uL (ref 3.80–5.10)
RDW: 13.1 % (ref 11.0–15.0)
Total Lymphocyte: 24.4 %
WBC: 11.8 10*3/uL — ABNORMAL HIGH (ref 3.8–10.8)

## 2019-10-22 MED ORDER — LEFLUNOMIDE 10 MG PO TABS: ORAL_TABLET | ORAL | 0 refills | Status: DC

## 2019-10-22 NOTE — Progress Notes (Signed)
WBC count is mildly elevated but trending down.  Absolute neutrophils are borderline elevated. Rest of CBC WNL.  CMP WNL.

## 2019-10-22 NOTE — Telephone Encounter (Signed)
Labs have resulted. WBC count is mildly elevated but trending down. Absolute neutrophils are borderline elevated. Rest of CBC WNL. CMP WNL.  Per office note on 10/21/2019: Arava 10 mg by mouth daily x2 weeks and if labs are stable at that time she will increase to 20 mg by mouth daily.

## 2019-10-23 ENCOUNTER — Telehealth: Payer: Self-pay | Admitting: *Deleted

## 2019-10-23 NOTE — Telephone Encounter (Signed)
Please notify the patient and advise her to try using a goodrx coupon at [employer].  This will be her most affordable option for a 90 day supply.

## 2019-10-23 NOTE — Telephone Encounter (Signed)
Received a PA request for [place]. Submitted PA via cover my meds and response states the drug is not covered by insurance plan. Please advise.

## 2019-10-23 NOTE — Telephone Encounter (Signed)
Attempted to contact patient and left message for patient to call the office.  

## 2019-11-30 NOTE — Progress Notes (Deleted)
Office Visit Note  Patient: [name] [name]             Date of Birth: 11-Nov-1960           MRN: [id number]             PCP: Pleas Koch, NP Referring: Pleas Koch, NP Visit Date: 12/10/2019 Occupation: @GUAROCC @  Subjective:  No chief complaint on file.   History of Present Illness: [name] [name] is a 59 y.o. female ***   Activities of Daily Living:  Patient reports morning stiffness for *** {minute/hour:19697}.   Patient {ACTIONS;DENIES/REPORTS:21021675::"Denies"} nocturnal pain.  Difficulty dressing/grooming: {ACTIONS;DENIES/REPORTS:21021675::"Denies"} Difficulty climbing stairs: {ACTIONS;DENIES/REPORTS:21021675::"Denies"} Difficulty getting out of chair: {ACTIONS;DENIES/REPORTS:21021675::"Denies"} Difficulty using hands for taps, buttons, cutlery, and/or writing: {ACTIONS;DENIES/REPORTS:21021675::"Denies"}  No Rheumatology ROS completed.   PMFS History:  Patient Active Problem List   Diagnosis Date Noted  . High risk medication use 06/29/2019  . Preventative health care 06/12/2018  . Psoriasis 04/06/2016  . Atherosclerosis of abdominal aorta ([facility]) 07/16/2014  . Adjustment disorder with mixed anxiety and depressed mood 07/16/2014  . Cervical postlaminectomy syndrome 01/10/2012  . Hot flashes 11/02/2011  . Chronic fatigue 11/02/2011  . Chronic back pain 08/17/2011  . COPD (chronic obstructive pulmonary disease) ([facility]) 04/21/2010  . Allergic rhinitis 07/01/2009  . STRESS INCONTINENCE 03/16/2008  . PAD (peripheral artery disease) ([facility]) 07/10/2007  . LEG PAIN, BILATERAL 06/24/2007  . Hyperlipidemia 01/01/2007  . TOBACCO ABUSE 01/01/2007  . INSOMNIA, CHRONIC 01/01/2007  . RESTLESS LEG SYNDROME 01/01/2007  . Essential hypertension 01/01/2007    Past Medical History:  Diagnosis Date  . Atypical chest pain   . Back pain    chronic  . Chronic insomnia   . Depression   . Eye abnormality    trouble seeing out of lt eye  . GERD (gastroesophageal reflux  disease)   . Herpes zoster without complication 0/16/0109  . Hyperlipidemia   . Hypertension   . PVD (peripheral vascular disease) ([facility])    segmental femeropoliteal disease  . Tobacco abuse     Family History  Problem Relation Age of Onset  . Alcohol abuse Father        deceased age 52  . Emphysema Mother        deceased age 67  . ADD / ADHD Son   . Healthy Son   . Asthma Sister   . ADD / ADHD Son   . Healthy Son   . Healthy Son   . Healthy Daughter    Past Surgical History:  Procedure Laterality Date  . BUNIONECTOMY  1996   bilateral  . CARDIAC CATHETERIZATION N/A 11/01/2014   Procedure: Left Heart Cath and Coronary Angiography;  Surgeon: Dionisio David, MD;  Location: [facility];  Service: Cardiovascular;  Laterality: N/A;  . CERVICAL DISCECTOMY  06/21/2006    and fusion   . CESAREAN SECTION  1990  . ENDOMETRIAL ABLATION  June 2010  . LOWER EXTREMITY ANGIOGRAPHY Left 08/05/2017   Procedure: LOWER EXTREMITY ANGIOGRAPHY;  Surgeon: Algernon Huxley, MD;  Location: [facility];  Service: Cardiovascular;  Laterality: Left;  . NASAL SINUS SURGERY  1999  . TUBAL LIGATION    . WRIST SURGERY  2005   right wrist   Social History   Social History Narrative   Divorced with 4 children    Current Smoker 1ppd    No alcohol   No illicit drug use      Occupation: Tax Scientist, forensic  Immunization History  Administered Date(s) Administered  . Td 06/21/2003  . Tdap 06/12/2018     Objective: Vital Signs: There were no vitals taken for this visit.   Physical Exam   Musculoskeletal Exam: ***  CDAI Exam: CDAI Score: -- Patient Global: --; Provider Global: -- Swollen: --; Tender: -- Joint Exam 12/10/2019   No joint exam has been documented for this visit   There is currently no information documented on the homunculus. Go to the Rheumatology activity and complete the homunculus joint exam.  Investigation: No additional findings.  Imaging: No results  found.  Recent Labs: Lab Results  Component Value Date   WBC 11.8 (H) 10/21/2019   HGB 14.5 10/21/2019   PLT 216 10/21/2019   NA 137 10/21/2019   K 4.8 10/21/2019   CL 103 10/21/2019   CO2 24 10/21/2019   GLUCOSE 93 10/21/2019   BUN 12 10/21/2019   CREATININE 0.87 10/21/2019   BILITOT 0.5 10/21/2019   ALKPHOS 86 08/03/2019   AST 12 10/21/2019   ALT 11 10/21/2019   PROT 6.2 10/21/2019   ALBUMIN 4.4 08/03/2019   CALCIUM 9.7 10/21/2019   GFRAA 83 10/21/2019   QFTBGOLDPLUS NEGATIVE 06/02/2019    Speciality Comments: No specialty comments available.  Procedures:  No procedures performed Allergies: Patient has no known allergies.   Assessment / Plan:     Visit Diagnoses: No diagnosis found.  Orders: No orders of the defined types were placed in this encounter.  No orders of the defined types were placed in this encounter.   Face-to-face time spent with patient was *** minutes. Greater than 50% of time was spent in counseling and coordination of care.  Follow-Up Instructions: No follow-ups on file.   Earnestine Mealing, CMA  Note - This record has been created using Editor, commissioning.  Chart creation errors have been sought, but may not always  have been located. Such creation errors do not reflect on  the standard of medical care.

## 2019-12-10 ENCOUNTER — Ambulatory Visit: Payer: Managed Care, Other (non HMO) | Admitting: Rheumatology

## 2019-12-15 ENCOUNTER — Inpatient Hospital Stay: Payer: Managed Care, Other (non HMO) | Attending: Oncology

## 2019-12-22 ENCOUNTER — Inpatient Hospital Stay: Payer: Managed Care, Other (non HMO) | Admitting: Oncology

## 2019-12-22 ENCOUNTER — Telehealth: Payer: Self-pay | Admitting: Oncology

## 2019-12-22 NOTE — Telephone Encounter (Signed)
Call placed to pt in regards to missed appointment. No answer, unable to leave VM

## 2019-12-29 ENCOUNTER — Other Ambulatory Visit: Payer: Self-pay | Admitting: Primary Care

## 2019-12-29 DIAGNOSIS — G4700 Insomnia, unspecified: Secondary | ICD-10-CM

## 2019-12-29 NOTE — Telephone Encounter (Signed)
Pharmacy requests refill on: Zolpidem 10 mg  LAST REFILL: 10/12/2019 (Q-90, R-0) LAST OV: 08/03/2019 NEXT OV: Not Scheduled  PHARMACY: [id number] [place], [place]

## 2020-01-03 DIAGNOSIS — G4700 Insomnia, unspecified: Secondary | ICD-10-CM

## 2020-01-05 MED ORDER — ZOLPIDEM TARTRATE 10 MG PO TABS: 10.0000 mg | ORAL_TABLET | Freq: Every evening | ORAL | 0 refills | Status: DC | PRN

## 2020-03-02 ENCOUNTER — Other Ambulatory Visit: Payer: Self-pay | Admitting: Primary Care

## 2020-03-02 DIAGNOSIS — I10 Essential (primary) hypertension: Secondary | ICD-10-CM

## 2020-03-08 NOTE — Progress Notes (Signed)
Office Visit Note  Patient: [name] [name]             Date of Birth: 1960/01/25           MRN: [id number]             PCP: Pleas Koch, NP Referring: Pleas Koch, NP Visit Date: 03/22/2020 Occupation: _0 @  Subjective:  Psoriasis flare.   History of Present Illness: [name] [name] is a 60 y.o. female with a history of psoriatic arthritis, psoriasis and osteoarthritis.  She returns today after her last visit in October 2021.  She states she could not get leflunomide covered by her insurance.  She continues to have recurrent redness in her eyes.  She has not seen an ophthalmologist in a while.  She continues to have discomfort in her lower SI joints.  She states her psoriasis is flaring.  She has psoriasis in her nose, earlobes, on her left back and her lower extremities.  Activities of Daily Living:  Patient reports morning stiffness for several hours.   Patient Denies nocturnal pain.  Difficulty dressing/grooming: Denies Difficulty climbing stairs: Reports Difficulty getting out of chair: Denies Difficulty using hands for taps, buttons, cutlery, and/or writing: Denies  Review of Systems  Constitutional: Positive for fatigue.  HENT: Positive for mouth dryness. Negative for mouth sores and nose dryness.   Eyes: Positive for pain, itching and visual disturbance. Negative for dryness.  Respiratory: Negative for cough, hemoptysis, shortness of breath and difficulty breathing.   Cardiovascular: Negative for chest pain, palpitations and swelling in legs/feet.  Gastrointestinal: Negative for abdominal pain, blood in stool, constipation and diarrhea.  Endocrine: Negative for increased urination.  Genitourinary: Negative for painful urination.  Musculoskeletal: Positive for arthralgias, joint pain, joint swelling and morning stiffness. Negative for myalgias, muscle weakness, muscle tenderness and myalgias.  Skin: Positive for rash and redness. Negative for color change.   Allergic/Immunologic: Negative for susceptible to infections.  Neurological: Positive for headaches and memory loss. Negative for dizziness, numbness and weakness.  Hematological: Negative for swollen glands.  Psychiatric/Behavioral: Positive for sleep disturbance. Negative for confusion.    PMFS History:  Patient Active Problem List   Diagnosis Date Noted  . High risk medication use 06/29/2019  . Preventative health care 06/12/2018  . Psoriasis 04/06/2016  . Atherosclerosis of abdominal aorta ([place]) 07/16/2014  . Adjustment disorder with mixed anxiety and depressed mood 07/16/2014  . Cervical postlaminectomy syndrome 01/10/2012  . Hot flashes 11/02/2011  . Chronic fatigue 11/02/2011  . Chronic back pain 08/17/2011  . COPD (chronic obstructive pulmonary disease) ([place]) 04/21/2010  . Allergic rhinitis 07/01/2009  . STRESS INCONTINENCE 03/16/2008  . PAD (peripheral artery disease) ([place]) 07/10/2007  . LEG PAIN, BILATERAL 06/24/2007  . Hyperlipidemia 01/01/2007  . TOBACCO ABUSE 01/01/2007  . INSOMNIA, CHRONIC 01/01/2007  . RESTLESS LEG SYNDROME 01/01/2007  . Essential hypertension 01/01/2007    Past Medical History:  Diagnosis Date  . Atypical chest pain   . Back pain    chronic  . Chronic insomnia   . Depression   . Eye abnormality    trouble seeing out of lt eye  . GERD (gastroesophageal reflux disease)   . Herpes zoster without complication 07/13/4578  . Hyperlipidemia   . Hypertension   . PVD (peripheral vascular disease) ([place])    segmental femeropoliteal disease  . Tobacco abuse     Family History  Problem Relation Age of Onset  . Alcohol abuse Father  deceased age 67  . Emphysema Mother        deceased age 81  . ADD / ADHD Son   . Healthy Son   . Asthma Sister   . ADD / ADHD Son   . Healthy Son   . Healthy Son   . Healthy Daughter    Past Surgical History:  Procedure Laterality Date  . BUNIONECTOMY  1996   bilateral  . CARDIAC CATHETERIZATION N/A  11/01/2014   Procedure: Left Heart Cath and Coronary Angiography;  Surgeon: Dionisio David, MD;  Location: [facility];  Service: Cardiovascular;  Laterality: N/A;  . CERVICAL DISCECTOMY  06/21/2006    and fusion   . CESAREAN SECTION  1990  . ENDOMETRIAL ABLATION  June 2010  . LOWER EXTREMITY ANGIOGRAPHY Left 08/05/2017   Procedure: LOWER EXTREMITY ANGIOGRAPHY;  Surgeon: Algernon Huxley, MD;  Location: [facility];  Service: Cardiovascular;  Laterality: Left;  . NASAL SINUS SURGERY  1999  . TUBAL LIGATION    . WRIST SURGERY  2005   right wrist   Social History   Social History Narrative   Divorced with 4 children    Current Smoker 1ppd    No alcohol   No illicit drug use      Occupation: Tax Surveyor, quantity History  Administered Date(s) Administered  . Td 06/21/2003  . Tdap 06/12/2018     Objective: Vital Signs: BP (!) 160/69 (BP Location: Left Arm, Patient Position: Sitting, Cuff Size: Normal)   Pulse 71   Ht 4' 11" (1.499 m)   Wt 106 lb 12.8 oz (48.4 kg)   BMI 21.57 kg/m    Physical Exam Vitals and nursing note reviewed.  Constitutional:      Appearance: She is well-developed.  HENT:     Head: Normocephalic and atraumatic.  Eyes:     Conjunctiva/sclera: Conjunctivae normal.  Cardiovascular:     Rate and Rhythm: Normal rate and regular rhythm.     Heart sounds: Normal heart sounds.  Pulmonary:     Effort: Pulmonary effort is normal.     Breath sounds: Normal breath sounds.  Abdominal:     General: Bowel sounds are normal.     Palpations: Abdomen is soft.  Musculoskeletal:     Cervical back: Normal range of motion.  Lymphadenopathy:     Cervical: No cervical adenopathy.  Skin:    General: Skin is warm and dry.     Capillary Refill: Capillary refill takes less than 2 seconds.  Neurological:     Mental Status: She is alert and oriented to person, place, and time.  Psychiatric:        Behavior: Behavior normal.       Musculoskeletal Exam: C-spine was in good range of motion.  She had tenderness over bilateral SI joints.  Shoulder joints, elbow joints, wrist joints with good range of motion.  She has DIP and PIP thickening with no synovitis.  Hip joints, knee joints were in good range of motion.  She had no tenderness over ankles or MTPs or PIPs.  No synovitis was noted.  CDAI Exam: CDAI Score: - Patient Global: -; Provider Global: - Swollen: -; Tender: - Joint Exam 03/22/2020   No joint exam has been documented for this visit   There is currently no information documented on the homunculus. Go to the Rheumatology activity and complete the homunculus joint exam.  Investigation: No additional findings.  Imaging:  No results found.  Recent Labs: Lab Results  Component Value Date   WBC 11.8 (H) 10/21/2019   HGB 14.5 10/21/2019   PLT 216 10/21/2019   NA 137 10/21/2019   K 4.8 10/21/2019   CL 103 10/21/2019   CO2 24 10/21/2019   GLUCOSE 93 10/21/2019   BUN 12 10/21/2019   CREATININE 0.87 10/21/2019   BILITOT 0.5 10/21/2019   ALKPHOS 86 08/03/2019   AST 12 10/21/2019   ALT 11 10/21/2019   PROT 6.2 10/21/2019   ALBUMIN 4.4 08/03/2019   CALCIUM 9.7 10/21/2019   GFRAA 83 10/21/2019   QFTBGOLDPLUS NEGATIVE 06/02/2019    Speciality Comments: No specialty comments available.  Procedures:  No procedures performed Allergies: Patient has no known allergies.   Assessment / Plan:     Visit Diagnoses: Psoriatic arthropathy (HCC) - History of multiple arthralgias, sacroiliitis, uveitis, psoriasis, HLA-B27 positive, family history of psoriasis: Patient had no synovitis on my examination today.  Although she continues to have uveitis and SI joint pain.  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  Patient states that she has not seen an ophthalmologist in a long time.  She had redness in her eyes today.  I will refer her to ophthalmology.  High risk medication use -  Humira 40 mg sq  injections once every 14 days-started on 06/29/19.  Patient did not start leflunomide after the last visit.  I detailed discussion regarding use of methotrexate.  Indications side effects contraindications of methotrexate were discussed.  The plan is to start on methotrexate 6 tablets p.o. weekly along with folic acid 1 mg p.o. daily.  We will check labs in 2 weeks x 2 and then if the labs are stable we can increase it to 8 tablets p.o. weekly.  Drug Counseling TB Gold: June 02, 2019 Hepatitis panel: June 02, 2019  Chest-xray: July 26, 2017  Contraception: Not applicable  Alcohol use: Denies  Patient was counseled on the purpose, proper use, and adverse effects of methotrexate including nausea, infection, and signs and symptoms of pneumonitis.  Reviewed instructions with patient to take methotrexate weekly along with folic acid daily.  Discussed the importance of frequent monitoring of kidney and liver function and blood counts, and provided patient with standing lab instructions.  Counseled patient to avoid NSAIDs and alcohol while on methotrexate.  Provided patient with educational materials on methotrexate and answered all questions.  Advised patient to get annual influenza vaccine and to get a pneumococcal vaccine if patient has not already had one.  Patient voiced understanding.  Patient consented to methotrexate use.  Will upload into chart.    Sacroiliitis (HCC) - MRI of her hip from December 21, 2013 which showed bilateral sacroiliitis.   Psoriasis - Skin biopsy confirmed psoriasis at [facility].  Patient states she has not seen a dermatologist in a long time.  And requests a referral.  HLA B27 (HLA B27 positive)-she had no synovitis on examination.  Primary osteoarthritis of both hands-she has bilateral PIP and DIP thickening.  Primary osteoarthritis of both feet-she has ongoing discomfort in her feet.  Abnormal SPEP - Referred to hematology.  Evaluated on 06/15/19 by Dr.  Rao.   Multiple problems are listed as follows:  Family history of psoriasis  Cervical postlaminectomy syndrome  Chronic obstructive pulmonary disease, unspecified COPD type (HCC)  Essential hypertension-blood pressure is elevated today.  Have advised her to monitor blood pressure closely.  Increased risk of heart disease with psoriatic arthritis   was discussed.  Dietary modifications and exercise was emphasized.  The handout was placed in the AVS.  Smoker-smoking cessation was discussed at length.  Increased risk of psoriasis and psoriatic arthritis with subcutaneous smoking was discussed.  Less efficacy of Biologics with smoking was discussed.  Atherosclerosis of abdominal aorta (HCC)  History of gastroesophageal reflux (GERD)  History of hyperlipidemia  PAD (peripheral artery disease) (HCC)  Adjustment disorder with mixed anxiety and depressed mood  Orders: Orders Placed This Encounter  Procedures  . CBC with Differential/Platelet  . COMPLETE METABOLIC PANEL WITH GFR  . Ambulatory referral to Ophthalmology  . Ambulatory referral to Dermatology   Meds ordered this encounter  Medications  . methotrexate (RHEUMATREX) 2.5 MG tablet    Sig: Take 6 tabs by mouth once weekly. Caution:Chemotherapy. Protect from light.    Dispense:  24 tablet    Refill:  2  . folic acid (FOLVITE) 1 MG tablet    Sig: Take 1 tablet (1 mg total) by mouth daily.    Dispense:  90 tablet    Refill:  3    Follow-Up Instructions: Return in about 6 weeks (around 05/03/2020) for Psoriatic arthritis.   Bo Merino, MD  Note - This record has been created using Editor, commissioning.  Chart creation errors have been sought, but may not always  have been located. Such creation errors do not reflect on  the standard of medical care.

## 2020-03-22 ENCOUNTER — Encounter: Payer: Self-pay | Admitting: Rheumatology

## 2020-03-22 ENCOUNTER — Other Ambulatory Visit: Payer: Self-pay

## 2020-03-22 ENCOUNTER — Ambulatory Visit (INDEPENDENT_AMBULATORY_CARE_PROVIDER_SITE_OTHER): Payer: Managed Care, Other (non HMO) | Admitting: Rheumatology

## 2020-03-22 VITALS — BP 160/69 | HR 71 | Ht 59.0 in | Wt 106.8 lb

## 2020-03-22 DIAGNOSIS — I739 Peripheral vascular disease, unspecified: Secondary | ICD-10-CM

## 2020-03-22 DIAGNOSIS — H209 Unspecified iridocyclitis: Secondary | ICD-10-CM | POA: Diagnosis not present

## 2020-03-22 DIAGNOSIS — L4050 Arthropathic psoriasis, unspecified: Secondary | ICD-10-CM

## 2020-03-22 DIAGNOSIS — I10 Essential (primary) hypertension: Secondary | ICD-10-CM

## 2020-03-22 DIAGNOSIS — M461 Sacroiliitis, not elsewhere classified: Secondary | ICD-10-CM | POA: Diagnosis not present

## 2020-03-22 DIAGNOSIS — J449 Chronic obstructive pulmonary disease, unspecified: Secondary | ICD-10-CM

## 2020-03-22 DIAGNOSIS — Z79899 Other long term (current) drug therapy: Secondary | ICD-10-CM

## 2020-03-22 DIAGNOSIS — R778 Other specified abnormalities of plasma proteins: Secondary | ICD-10-CM

## 2020-03-22 DIAGNOSIS — M19041 Primary osteoarthritis, right hand: Secondary | ICD-10-CM

## 2020-03-22 DIAGNOSIS — M19071 Primary osteoarthritis, right ankle and foot: Secondary | ICD-10-CM

## 2020-03-22 DIAGNOSIS — L409 Psoriasis, unspecified: Secondary | ICD-10-CM

## 2020-03-22 DIAGNOSIS — Z840 Family history of diseases of the skin and subcutaneous tissue: Secondary | ICD-10-CM

## 2020-03-22 DIAGNOSIS — F4323 Adjustment disorder with mixed anxiety and depressed mood: Secondary | ICD-10-CM

## 2020-03-22 DIAGNOSIS — I700 Atherosclerosis of aorta: Secondary | ICD-10-CM

## 2020-03-22 DIAGNOSIS — M961 Postlaminectomy syndrome, not elsewhere classified: Secondary | ICD-10-CM

## 2020-03-22 DIAGNOSIS — Z8719 Personal history of other diseases of the digestive system: Secondary | ICD-10-CM

## 2020-03-22 DIAGNOSIS — M19072 Primary osteoarthritis, left ankle and foot: Secondary | ICD-10-CM

## 2020-03-22 DIAGNOSIS — Z8639 Personal history of other endocrine, nutritional and metabolic disease: Secondary | ICD-10-CM

## 2020-03-22 DIAGNOSIS — M19042 Primary osteoarthritis, left hand: Secondary | ICD-10-CM

## 2020-03-22 DIAGNOSIS — Z1589 Genetic susceptibility to other disease: Secondary | ICD-10-CM

## 2020-03-22 DIAGNOSIS — F17200 Nicotine dependence, unspecified, uncomplicated: Secondary | ICD-10-CM

## 2020-03-22 LAB — CBC WITH DIFFERENTIAL/PLATELET
Absolute Monocytes: 702 cells/uL (ref 200–950)
Basophils Absolute: 35 cells/uL (ref 0–200)
Basophils Relative: 0.3 %
Eosinophils Absolute: 199 cells/uL (ref 15–500)
Eosinophils Relative: 1.7 %
HCT: 41.6 % (ref 35.0–45.0)
Hemoglobin: 13.9 g/dL (ref 11.7–15.5)
Lymphs Abs: 2750 cells/uL (ref 850–3900)
MCH: 30.5 pg (ref 27.0–33.0)
MCHC: 33.4 g/dL (ref 32.0–36.0)
MCV: 91.2 fL (ref 80.0–100.0)
MPV: 10.2 fL (ref 7.5–12.5)
Monocytes Relative: 6 %
Neutro Abs: 8015 cells/uL — ABNORMAL HIGH (ref 1500–7800)
Neutrophils Relative %: 68.5 %
Platelets: 219 10*3/uL (ref 140–400)
RBC: 4.56 10*6/uL (ref 3.80–5.10)
RDW: 13.6 % (ref 11.0–15.0)
Total Lymphocyte: 23.5 %
WBC: 11.7 10*3/uL — ABNORMAL HIGH (ref 3.8–10.8)

## 2020-03-22 LAB — COMPLETE METABOLIC PANEL WITH GFR
AG Ratio: 1.8 (calc) (ref 1.0–2.5)
ALT: 12 U/L (ref 6–29)
AST: 13 U/L (ref 10–35)
Albumin: 4.2 g/dL (ref 3.6–5.1)
Alkaline phosphatase (APISO): 84 U/L (ref 37–153)
BUN: 9 mg/dL (ref 7–25)
CO2: 29 mmol/L (ref 20–32)
Calcium: 9.7 mg/dL (ref 8.6–10.4)
Chloride: 102 mmol/L (ref 98–110)
Creat: 0.84 mg/dL (ref 0.50–0.99)
GFR, Est African American: 87 mL/min/{1.73_m2} (ref >=60)
GFR, Est Non African American: 75 mL/min/{1.73_m2} (ref >=60)
Globulin: 2.3 g/dL (calc) (ref 1.9–3.7)
Glucose, Bld: 90 mg/dL (ref 65–99)
Potassium: 4.5 mmol/L (ref 3.5–5.3)
Sodium: 137 mmol/L (ref 135–146)
Total Bilirubin: 0.6 mg/dL (ref 0.2–1.2)
Total Protein: 6.5 g/dL (ref 6.1–8.1)

## 2020-03-22 MED ORDER — FOLIC ACID 1 MG PO TABS: 1.0000 mg | ORAL_TABLET | Freq: Every day | ORAL | 3 refills | Status: DC

## 2020-03-22 MED ORDER — METHOTREXATE 2.5 MG PO TABS: ORAL_TABLET | ORAL | 2 refills | Status: DC

## 2020-03-22 NOTE — Patient Instructions (Addendum)
Standing Labs We placed an order today for your standing lab work.   Please have your standing labs drawn in 2 weeks x2 and every 3 months  If possible, please have your labs drawn 2 weeks prior to your appointment so that the provider can discuss your results at your appointment.  We have open lab daily Monday through Thursday from 1:30-4:30 PM and Friday from 1:30-4:00 PM at the office of Dr. Bo Merino, [facility].   Please be advised, all patients with office appointments requiring lab work will take precedents over walk-in lab work.  If possible, please come for your lab work on Monday and Friday afternoons, as you may experience shorter wait times. The office is located at [address], [place], [place], [place] [address] No appointment is necessary.   Labs are drawn by [facility]. Please bring your co-pay at the time of your lab draw.  You may receive a bill from [facility] for your lab work.  If you wish to have your labs drawn at another location, please call the office 24 hours in advance to send orders.  If you have any questions regarding directions or hours of operation,  please call [phone].   As a reminder, please drink plenty of water prior to coming for your lab work. Thanks!  Vaccines You are taking a medication(s) that can suppress your immune system.  The following immunizations are recommended: . Flu annually . Covid-19  . Pneumonia (Pneumovax 23 and Prevnar 13 spaced at least 1 year apart) . Shingrix (after age 60)  Heart Disease Prevention   Your inflammatory disease increases your risk of heart disease which includes heart attack, stroke, atrial fibrillation (irregular heartbeats), high blood pressure, heart failure and atherosclerosis (plaque in the arteries).  It is important to reduce your risk by:   . Keep blood pressure, cholesterol, and blood sugar at healthy levels   . Smoking Cessation   . Maintain a healthy weight  o BMI 20-25    . Eat a healthy diet  o Plenty of fresh fruit, vegetables, and whole grains  o Limit saturated fats, foods high in sodium, and added sugars  o DASH and Mediterranean diet   . Increase physical activity  o Recommend moderate physically activity for 150 minutes per week/ 30 minutes a day for five days a week These can be broken up into three separate ten-minute sessions during the day.   . Reduce Stress  . Meditation, slow breathing exercises, yoga, coloring books  . Dental visits twice a year    Please check with your PCP to make sure you are up to date.  Methotrexate tablets What is this medicine? METHOTREXATE (METH oh TREX ate) is a chemotherapy drug used to treat cancer including breast cancer, leukemia, and lymphoma. This medicine can also be used to treat psoriasis and certain kinds of arthritis. This medicine may be used for other purposes; ask your health care provider or pharmacist if you have questions. COMMON BRAND NAME(S): Rheumatrex, Trexall What should I tell my health care provider before I take this medicine? They need to know if you have any of these conditions:  fluid in the stomach area or lungs  if you often drink alcohol  infection or immune system problems  kidney disease or on hemodialysis  liver disease  low blood counts, like low white cell, platelet, or red cell counts  lung disease  radiation therapy  stomach ulcers  ulcerative colitis  an unusual or allergic  reaction to methotrexate, other medicines, foods, dyes, or preservatives  pregnant or trying to get pregnant  breast-feeding How should I use this medicine? Take this medicine by mouth with a glass of water. Follow the directions on the prescription label. Take your medicine at regular intervals. Do not take it more often than directed. Do not stop taking except on your doctor's advice. Make sure you know why you are taking this medicine and how often you should take it. If this  medicine is used for a condition that is not cancer, like arthritis or psoriasis, it should be taken weekly, NOT daily. Taking this medicine more often than directed can cause serious side effects, even death. Talk to your healthcare provider about safe handling and disposal of this medicine. You may need to take special precautions. Talk to your pediatrician regarding the use of this medicine in children. While this drug may be prescribed for selected conditions, precautions do apply. Overdosage: If you think you have taken too much of this medicine contact a poison control center or emergency room at once. NOTE: This medicine is only for you. Do not share this medicine with others. What if I miss a dose? If you miss a dose, talk with your doctor or health care professional. Do not take double or extra doses. What may interact with this medicine? Do not take this medicine with any of the following medications:  acitretin This medicine may also interact with the following medication:  aspirin and aspirin-like medicines including salicylates  azathioprine  certain antibiotics like penicillins, tetracycline, and chloramphenicol  certain medicines that treat or prevent blood clots like warfarin, apixaban, dabigatran, and rivaroxaban  certain medicines for stomach problems like esomeprazole, omeprazole, pantoprazole  cyclosporine  dapsone  diuretics  gold  hydroxychloroquine  live virus vaccines  medicines for infection like acyclovir, adefovir, amphotericin B, bacitracin, cidofovir, foscarnet, ganciclovir, gentamicin, pentamidine, vancomycin  mercaptopurine  NSAIDs, medicines for pain and inflammation, like ibuprofen or naproxen  other cytotoxic agents  pamidronate  pemetrexed  penicillamine  phenylbutazone  phenytoin  probenecid  pyrimethamine  retinoids such as isotretinoin and tretinoin  steroid medicines like prednisone or cortisone  sulfonamides like  sulfasalazine and trimethoprim/sulfamethoxazole  theophylline  zoledronic acid This list may not describe all possible interactions. Give your health care provider a list of all the medicines, herbs, non-prescription drugs, or dietary supplements you use. Also tell them if you smoke, drink alcohol, or use illegal drugs. Some items may interact with your medicine. What should I watch for while using this medicine? Avoid alcoholic drinks. This medicine can make you more sensitive to the sun. Keep out of the sun. If you cannot avoid being in the sun, wear protective clothing and use sunscreen. Do not use sun lamps or tanning beds/booths. You may need blood work done while you are taking this medicine. Call your doctor or health care professional for advice if you get a fever, chills or sore throat, or other symptoms of a cold or flu. Do not treat yourself. This drug decreases your body's ability to fight infections. Try to avoid being around people who are sick. This medicine may increase your risk to bruise or bleed. Call your doctor or health care professional if you notice any unusual bleeding. Be careful brushing or flossing your teeth or using a toothpick because you may get an infection or bleed more easily. If you have any dental work done, tell your dentist you are receiving this medicine. Check with your doctor  or health care professional if you get an attack of severe diarrhea, nausea and vomiting, or if you sweat a lot. The loss of too much body fluid can make it dangerous for you to take this medicine. Talk to your doctor about your risk of cancer. You may be more at risk for certain types of cancers if you take this medicine. Do not become pregnant while taking this medicine or for 6 months after stopping it. Women should inform their health care provider if they wish to become pregnant or think they might be pregnant. Men should not father a child while taking this medicine and for 3 months  after stopping it. There is potential for serious harm to an unborn child. Talk to your health care provider for more information. Do not breast-feed an infant while taking this medicine or for 1 week after stopping it. This medicine may make it more difficult to get pregnant or father a child. Talk to your health care provider if you are concerned about your fertility. What side effects may I notice from receiving this medicine? Side effects that you should report to your doctor or health care professional as soon as possible:  allergic reactions like skin rash, itching or hives, swelling of the face, lips, or tongue  breathing problems or shortness of breath  diarrhea  dry, nonproductive cough  low blood counts - this medicine may decrease the number of white blood cells, red blood cells and platelets. You may be at increased risk for infections and bleeding.  mouth sores  redness, blistering, peeling or loosening of the skin, including inside the mouth  signs of infection - fever or chills, cough, sore throat, pain or trouble passing urine  signs and symptoms of bleeding such as bloody or black, tarry stools; red or dark-brown urine; spitting up blood or brown material that looks like coffee grounds; red spots on the skin; unusual bruising or bleeding from the eye, gums, or nose  signs and symptoms of kidney injury like trouble passing urine or change in the amount of urine  signs and symptoms of liver injury like dark yellow or brown urine; general ill feeling or flu-like symptoms; light-colored stools; loss of appetite; nausea; right upper belly pain; unusually weak or tired; yellowing of the eyes or skin Side effects that usually do not require medical attention (report to your doctor or health care professional if they continue or are bothersome):  dizziness  hair loss  tiredness  upset stomach  vomiting This list may not describe all possible side effects. Call your  doctor for medical advice about side effects. You may report side effects to FDA at 1-800-FDA-1088. Where should I keep my medicine? Keep out of the reach of children and pets. Store at room temperature between 20 and 25 degrees C (68 and 77 degrees F). Protect from light. Get rid of any unused medicine after the expiration date. Talk to your health care provider about how to dispose of unused medicine. Special directions may apply. NOTE: This sheet is a summary. It may not cover all possible information. If you have questions about this medicine, talk to your doctor, pharmacist, or health care provider.  2021 Elsevier/Gold Standard (2019-07-20 10:40:39)

## 2020-03-23 ENCOUNTER — Other Ambulatory Visit: Payer: Self-pay | Admitting: Primary Care

## 2020-03-23 DIAGNOSIS — G4700 Insomnia, unspecified: Secondary | ICD-10-CM

## 2020-03-23 NOTE — Progress Notes (Signed)
Labs are stable.

## 2020-03-25 ENCOUNTER — Other Ambulatory Visit: Payer: Self-pay | Admitting: Pharmacist

## 2020-03-25 DIAGNOSIS — L4050 Arthropathic psoriasis, unspecified: Secondary | ICD-10-CM

## 2020-03-25 DIAGNOSIS — H209 Unspecified iridocyclitis: Secondary | ICD-10-CM

## 2020-03-25 NOTE — Telephone Encounter (Signed)
RX faxed from [facility]  Next Visit: 05/06/2020  Last Visit: 03/22/2020  Last Fill: 09/14/2019  DX: Psoriatic arthropathy   Current Dose per office note 03/22/2020, Humira 40 mg sq injections once every 14 days  Labs: 03/22/2020, Labs are stable.  TB Gold: 06/02/2019, negative  Okay to refill Humira?

## 2020-03-28 ENCOUNTER — Telehealth: Payer: Self-pay

## 2020-03-28 NOTE — Telephone Encounter (Signed)
Patient called stating she went on [facility]'s website today to check the status of her refill and it states it is still in process.  Patient states she was due to take her injection on Saturday, 03/26/20.  Please advise.

## 2020-03-29 NOTE — Telephone Encounter (Signed)
LMOM for patient to call office. 

## 2020-03-29 NOTE — Telephone Encounter (Signed)
Patient will cal [facility], patient will call office if she wants to pick up a sample.

## 2020-04-22 NOTE — Progress Notes (Deleted)
Office Visit Note  Patient: [name] [name]             Date of Birth: 1960-11-20           MRN: [id number]             PCP: Pleas Koch, NP Referring: Pleas Koch, NP Visit Date: 05/06/2020 Occupation: _0 @  Subjective:  No chief complaint on file.   History of Present Illness: [name] [name] is a 60 y.o. female ***   Activities of Daily Living:  Patient reports morning stiffness for *** {minute/hour:19697}.   Patient {ACTIONS;DENIES/REPORTS:21021675::"Denies"} nocturnal pain.  Difficulty dressing/grooming: {ACTIONS;DENIES/REPORTS:21021675::"Denies"} Difficulty climbing stairs: {ACTIONS;DENIES/REPORTS:21021675::"Denies"} Difficulty getting out of chair: {ACTIONS;DENIES/REPORTS:21021675::"Denies"} Difficulty using hands for taps, buttons, cutlery, and/or writing: {ACTIONS;DENIES/REPORTS:21021675::"Denies"}  No Rheumatology ROS completed.   PMFS History:  Patient Active Problem List   Diagnosis Date Noted  . High risk medication use 06/29/2019  . Preventative health care 06/12/2018  . Psoriasis 04/06/2016  . Atherosclerosis of abdominal aorta ([place] ) 07/16/2014  . Adjustment disorder with mixed anxiety and depressed mood 07/16/2014  . Cervical postlaminectomy syndrome 01/10/2012  . Hot flashes 11/02/2011  . Chronic fatigue 11/02/2011  . Chronic back pain 08/17/2011  . COPD (chronic obstructive pulmonary disease) ([place]) 04/21/2010  . Allergic rhinitis 07/01/2009  . STRESS INCONTINENCE 03/16/2008  . PAD (peripheral artery disease) ([place]) 07/10/2007  . LEG PAIN, BILATERAL 06/24/2007  . Hyperlipidemia 01/01/2007  . TOBACCO ABUSE 01/01/2007  . INSOMNIA, CHRONIC 01/01/2007  . RESTLESS LEG SYNDROME 01/01/2007  . Essential hypertension 01/01/2007    Past Medical History:  Diagnosis Date  . Atypical chest pain   . Back pain    chronic  . Chronic insomnia   . Depression   . Eye abnormality    trouble seeing out of lt eye  . GERD (gastroesophageal reflux  disease)   . Herpes zoster without complication 7/00/1749  . Hyperlipidemia   . Hypertension   . PVD (peripheral vascular disease) ([place])    segmental femeropoliteal disease  . Tobacco abuse     Family History  Problem Relation Age of Onset  . Alcohol abuse Father        deceased age 74  . Emphysema Mother        deceased age 29  . ADD / ADHD Son   . Healthy Son   . Asthma Sister   . ADD / ADHD Son   . Healthy Son   . Healthy Son   . Healthy Daughter    Past Surgical History:  Procedure Laterality Date  . BUNIONECTOMY  1996   bilateral  . CARDIAC CATHETERIZATION N/A 11/01/2014   Procedure: Left Heart Cath and Coronary Angiography;  Surgeon: Dionisio David, MD;  Location: [facility];  Service: Cardiovascular;  Laterality: N/A;  . CERVICAL DISCECTOMY  06/21/2006    and fusion   . CESAREAN SECTION  1990  . ENDOMETRIAL ABLATION  June 2010  . LOWER EXTREMITY ANGIOGRAPHY Left 08/05/2017   Procedure: LOWER EXTREMITY ANGIOGRAPHY;  Surgeon: Algernon Huxley, MD;  Location: [facility];  Service: Cardiovascular;  Laterality: Left;  . NASAL SINUS SURGERY  1999  . TUBAL LIGATION    . WRIST SURGERY  2005   right wrist   Social History   Social History Narrative   Divorced with 4 children    Current Smoker 1ppd    No alcohol   No illicit drug use      Occupation: Tax Scientist, forensic  Immunization History  Administered Date(s) Administered  . Td 06/21/2003  . Tdap 06/12/2018     Objective: Vital Signs: There were no vitals taken for this visit.   Physical Exam   Musculoskeletal Exam: ***  CDAI Exam: CDAI Score: -- Patient Global: --; Provider Global: -- Swollen: --; Tender: -- Joint Exam 05/06/2020   No joint exam has been documented for this visit   There is currently no information documented on the homunculus. Go to the Rheumatology activity and complete the homunculus joint exam.  Investigation: No additional findings.  Imaging: No results  found.  Recent Labs: Lab Results  Component Value Date   WBC 11.7 (H) 03/22/2020   HGB 13.9 03/22/2020   PLT 219 03/22/2020   NA 137 03/22/2020   K 4.5 03/22/2020   CL 102 03/22/2020   CO2 29 03/22/2020   GLUCOSE 90 03/22/2020   BUN 9 03/22/2020   CREATININE 0.84 03/22/2020   BILITOT 0.6 03/22/2020   ALKPHOS 86 08/03/2019   AST 13 03/22/2020   ALT 12 03/22/2020   PROT 6.5 03/22/2020   ALBUMIN 4.4 08/03/2019   CALCIUM 9.7 03/22/2020   GFRAA 87 03/22/2020   QFTBGOLDPLUS NEGATIVE 06/02/2019    Speciality Comments: No specialty comments available.  Procedures:  No procedures performed Allergies: Patient has no known allergies.   Assessment / Plan:     Visit Diagnoses: Psoriatic arthropathy ([place])  Uveitis  High risk medication use  Sacroiliitis (HCC)  Psoriasis  HLA B27 (HLA B27 positive)  Primary osteoarthritis of both hands  Primary osteoarthritis of both feet  Abnormal SPEP  Family history of psoriasis  Cervical postlaminectomy syndrome  Chronic obstructive pulmonary disease, unspecified COPD type ([place])  Essential hypertension  Smoker  Atherosclerosis of abdominal aorta (H[facility])  History of gastroesophageal reflux (GERD)  History of hyperlipidemia  PAD (peripheral artery disease) (H[facility])  Adjustment disorder with mixed anxiety and depressed mood  Orders: No orders of the defined types were placed in this encounter.  No orders of the defined types were placed in this encounter.   Face-to-face time spent with patient was *** minutes. Greater than 50% of time was spent in counseling and coordination of care.  Follow-Up Instructions: No follow-ups on file.   [name], PA-C  Note - This record has been created using Dragon software.  Chart creation errors have been sought, but may not always  have been located. Such creation errors do not reflect on  the standard of medical care.

## 2020-04-28 ENCOUNTER — Encounter: Payer: Self-pay | Admitting: *Deleted

## 2020-04-28 NOTE — Telephone Encounter (Signed)
LMOM, patient started MTX after OV on 03/22/2020, patient has not updated labs since starting MTX, she has not increased the dose to 20 mg as recommended intially, she will require more time for MTX to start controlling the inflammation in her eyes.  Notes from [facility] in [place], 04/26/2020 with [place], Dr. Manuella Ghazi.  Reviewed by Hazel Sams, PA-C

## 2020-05-06 ENCOUNTER — Ambulatory Visit: Payer: Managed Care, Other (non HMO) | Admitting: Physician Assistant

## 2020-05-06 DIAGNOSIS — M19041 Primary osteoarthritis, right hand: Secondary | ICD-10-CM

## 2020-05-06 DIAGNOSIS — Z8639 Personal history of other endocrine, nutritional and metabolic disease: Secondary | ICD-10-CM

## 2020-05-06 DIAGNOSIS — Z79899 Other long term (current) drug therapy: Secondary | ICD-10-CM

## 2020-05-06 DIAGNOSIS — R778 Other specified abnormalities of plasma proteins: Secondary | ICD-10-CM

## 2020-05-06 DIAGNOSIS — L409 Psoriasis, unspecified: Secondary | ICD-10-CM

## 2020-05-06 DIAGNOSIS — F4323 Adjustment disorder with mixed anxiety and depressed mood: Secondary | ICD-10-CM

## 2020-05-06 DIAGNOSIS — Z1589 Genetic susceptibility to other disease: Secondary | ICD-10-CM

## 2020-05-06 DIAGNOSIS — J449 Chronic obstructive pulmonary disease, unspecified: Secondary | ICD-10-CM

## 2020-05-06 DIAGNOSIS — I700 Atherosclerosis of aorta: Secondary | ICD-10-CM

## 2020-05-06 DIAGNOSIS — L4050 Arthropathic psoriasis, unspecified: Secondary | ICD-10-CM

## 2020-05-06 DIAGNOSIS — Z840 Family history of diseases of the skin and subcutaneous tissue: Secondary | ICD-10-CM

## 2020-05-06 DIAGNOSIS — H209 Unspecified iridocyclitis: Secondary | ICD-10-CM

## 2020-05-06 DIAGNOSIS — I10 Essential (primary) hypertension: Secondary | ICD-10-CM

## 2020-05-06 DIAGNOSIS — I739 Peripheral vascular disease, unspecified: Secondary | ICD-10-CM

## 2020-05-06 DIAGNOSIS — M461 Sacroiliitis, not elsewhere classified: Secondary | ICD-10-CM

## 2020-05-06 DIAGNOSIS — M961 Postlaminectomy syndrome, not elsewhere classified: Secondary | ICD-10-CM

## 2020-05-06 DIAGNOSIS — F17200 Nicotine dependence, unspecified, uncomplicated: Secondary | ICD-10-CM

## 2020-05-06 DIAGNOSIS — M19071 Primary osteoarthritis, right ankle and foot: Secondary | ICD-10-CM

## 2020-05-06 DIAGNOSIS — Z8719 Personal history of other diseases of the digestive system: Secondary | ICD-10-CM

## 2020-05-28 ENCOUNTER — Other Ambulatory Visit: Payer: Self-pay | Admitting: Physician Assistant

## 2020-05-28 DIAGNOSIS — H209 Unspecified iridocyclitis: Secondary | ICD-10-CM

## 2020-05-28 DIAGNOSIS — L4050 Arthropathic psoriasis, unspecified: Secondary | ICD-10-CM

## 2020-06-04 IMAGING — CT CT ANGIO CHEST
2 of 7 series · 18 of 46 positions shown · IV contrast (APPLIED)
Comparison: None.

CLINICAL DATA: High pretest probability for acute pulmonary
embolus. Right-sided scapular pain.

EXAM:
CT ANGIOGRAPHY CHEST WITH CONTRAST
TECHNIQUE: Multidetector CT imaging of the chest was performed using the
standard protocol during bolus administration of intravenous
contrast. Multiplanar CT image reconstructions and MIPs were
obtained to evaluate the vascular anatomy.
CONTRAST:  75mL 3WPX0E-2X6 IOPAMIDOL (3WPX0E-2X6) INJECTION 76%

[Series 5: thins · axial · 0.60mm/px · z∈[-28,+227]mm · 15 of 287 slices shown]
[im 16/287  lung]
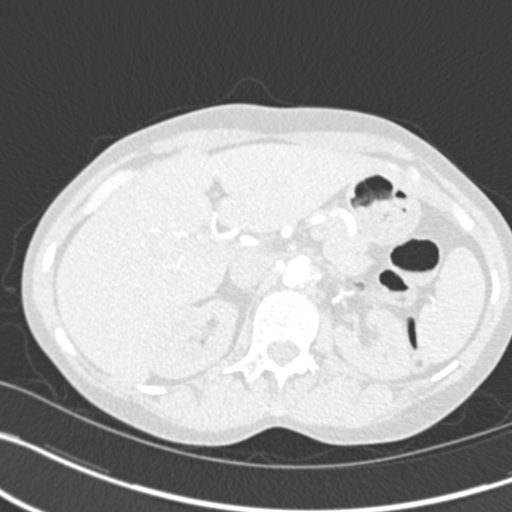
[im 31/287  soft-tissue]
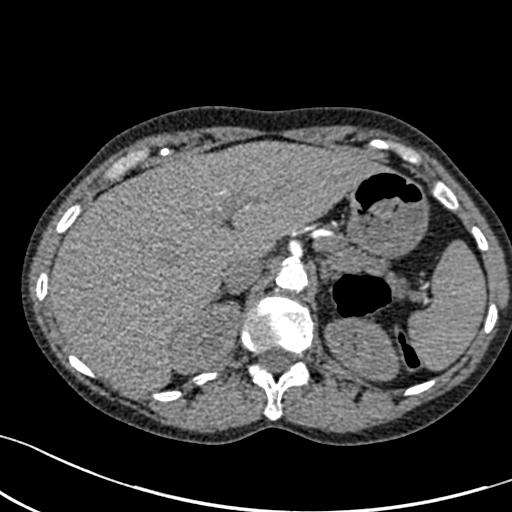
[im 61/287  lung]
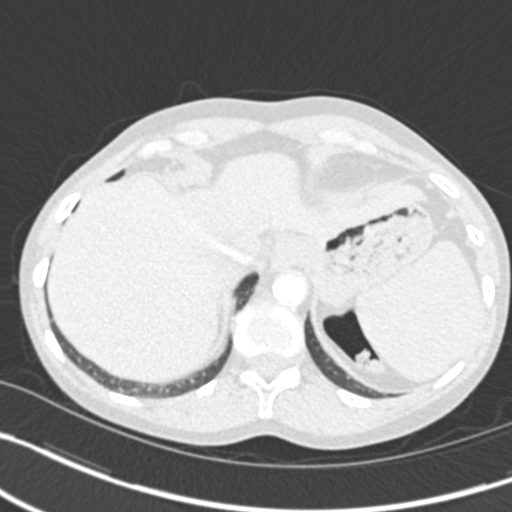
[im 76/287  soft-tissue]
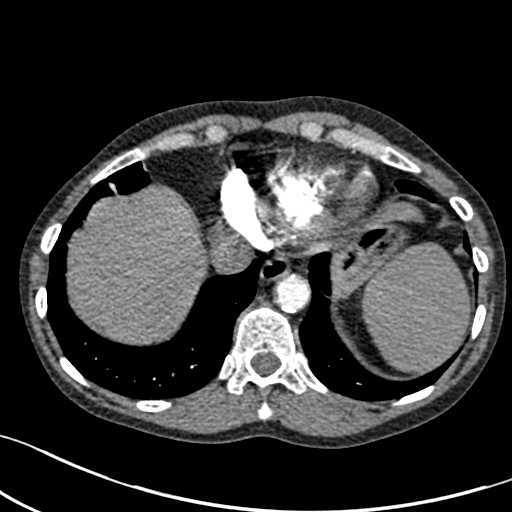
[im 91/287  lung]
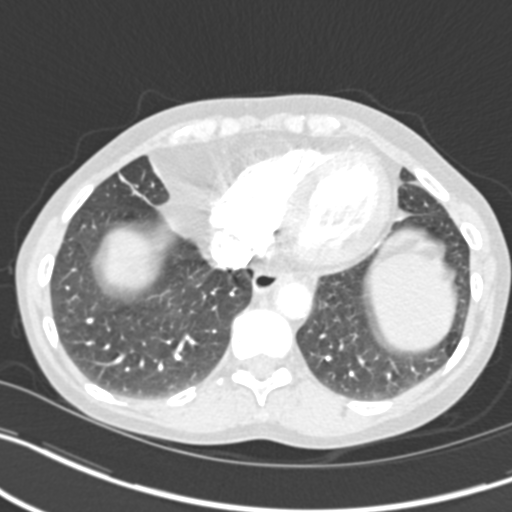
[im 106/287  soft-tissue]
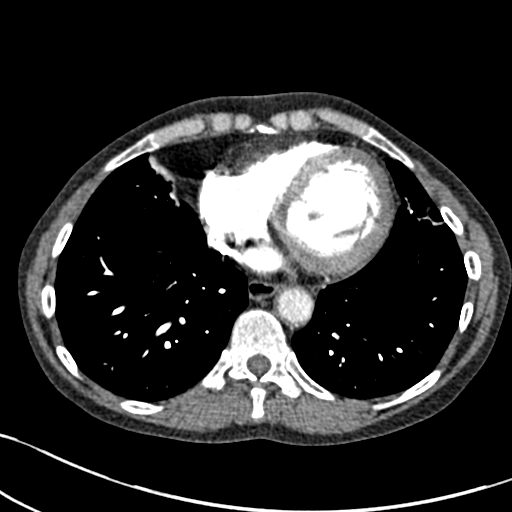
[im 121/287  lung]
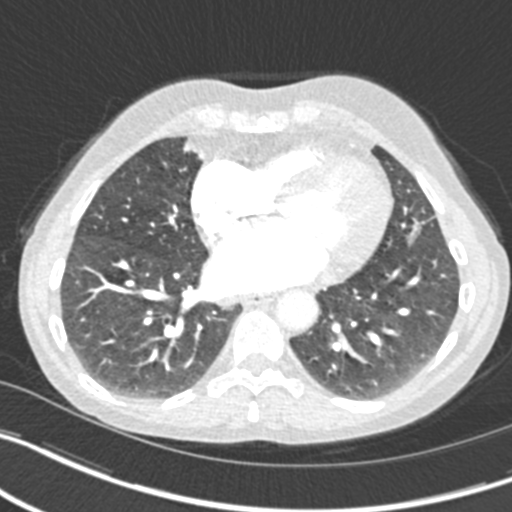
[im 151/287  soft-tissue]
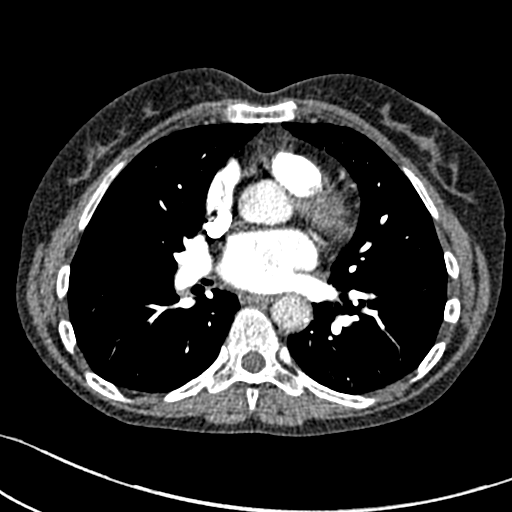
[im 166/287  lung]
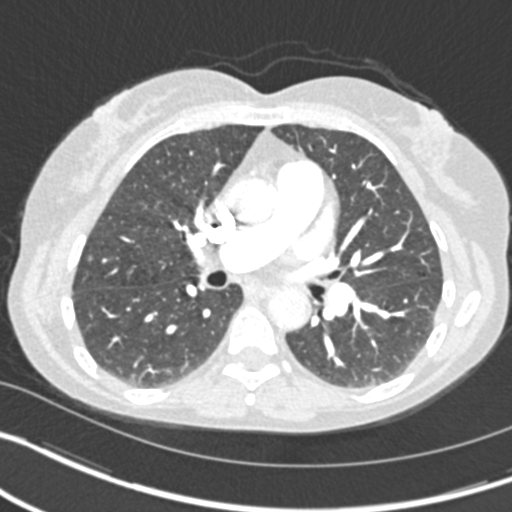
[im 181/287  soft-tissue]
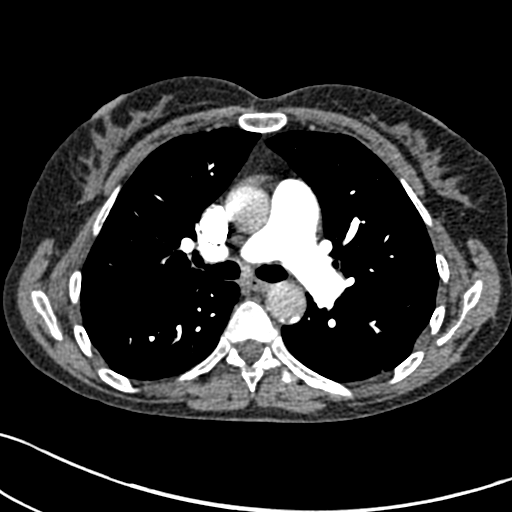
[im 196/287  lung]
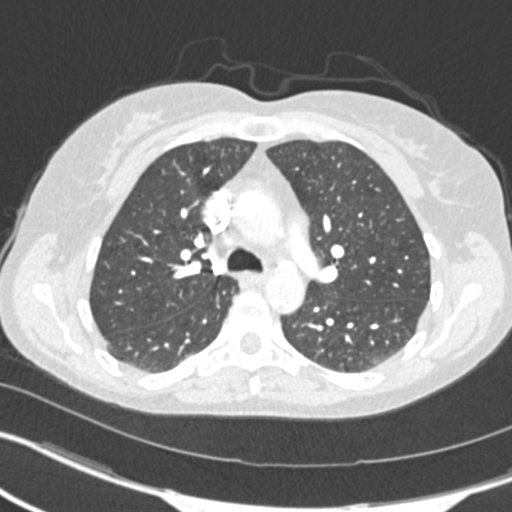
[im 211/287  soft-tissue]
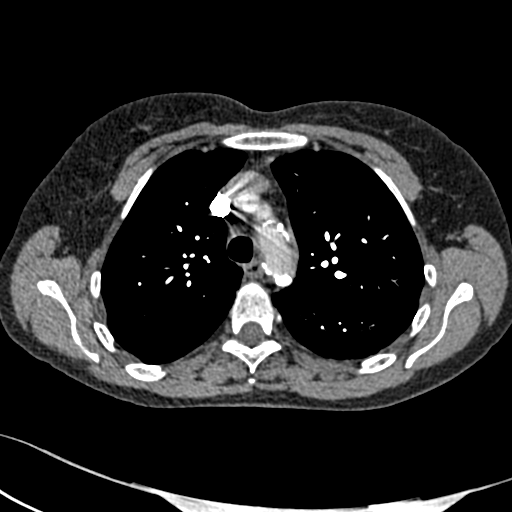
[im 241/287  lung]
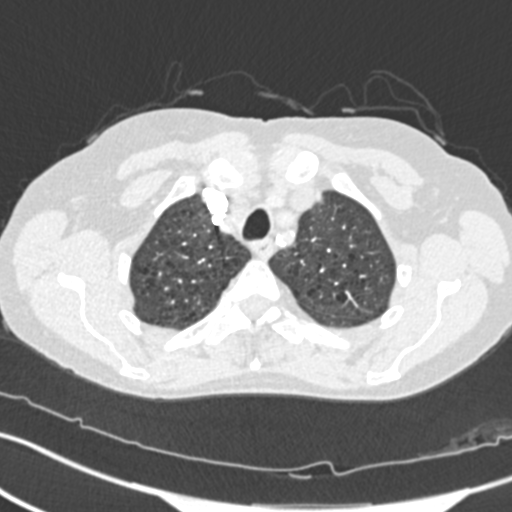
[im 256/287  soft-tissue]
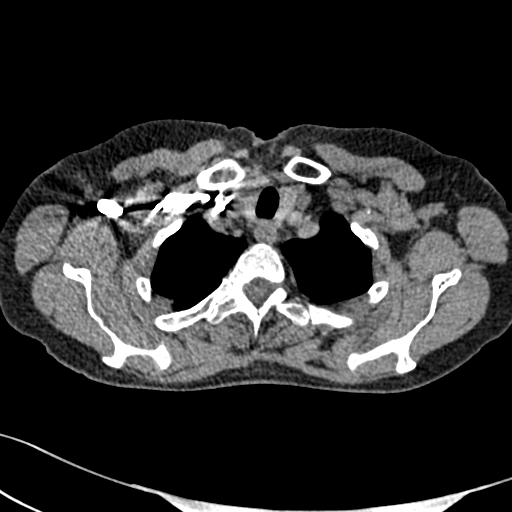
[im 271/287  lung]
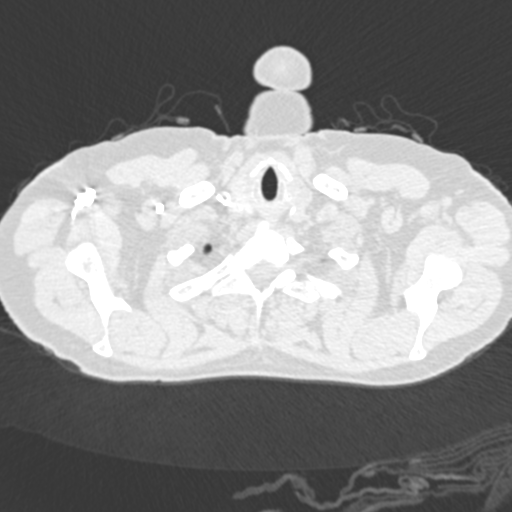

[Series 7: coronal mpr · coronal · 0.59mm/px · 3 of 65 slices shown]
[im 17/65  soft-tissue]
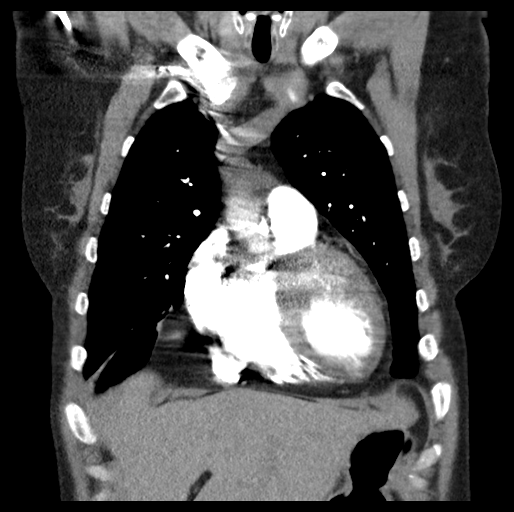
[im 33/65  soft-tissue]
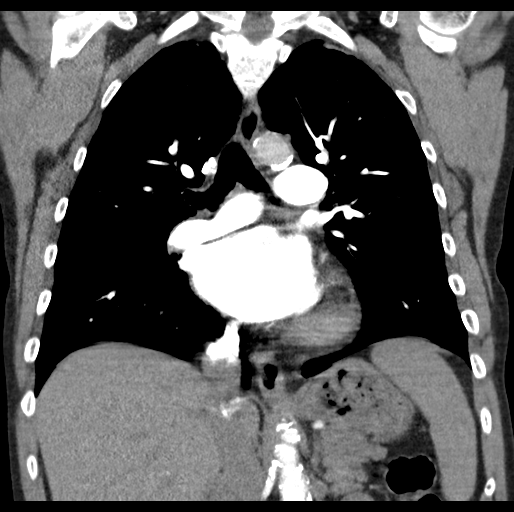
[im 49/65  soft-tissue]
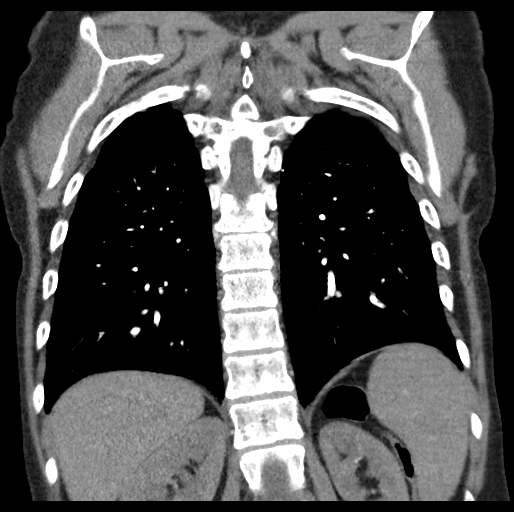

[18 of 46 positions shown; findings below may reference images not displayed]

FINDINGS: Cardiovascular: Satisfactory opacification of the pulmonary arteries
to the segmental level. No evidence of pulmonary embolism. Normal
heart size. No pericardial effusion.

Normal heart size. No pericardial effusion. Aortic atherosclerosis
noted.

Mediastinum/Nodes: No enlarged mediastinal, hilar, or axillary lymph
nodes. Thyroid gland, trachea, and esophagus demonstrate no
significant findings.

Lungs/Pleura: No pleural effusion. Mild bronchiectasis and scarring
identified within the right middle lobe. No airspace densities
identified. Mild changes of emphysema. Scattered small pulmonary
nodules are identified within both lungs. This includes a 3 mm right
upper lobe lung nodule, image [DATE]. Within the left upper lobe there
is a 3 mm lung nodule, image [DATE]. 3 mm left upper lobe lung nodule
also noted, image [DATE]. Lateral right upper lobe lung nodule
measures 3 mm, image 40/6.

Upper Abdomen: No acute abnormality.

Musculoskeletal: No chest wall abnormality. No acute or significant
osseous findings.

Review of the MIP images confirms the above findings.
IMPRESSION: 1. No acute pulmonary embolus.
2. Aortic Atherosclerosis (XNLOP-ZKP.P) and Emphysema (XNLOP-NG0.C).
3. Scattered small pulmonary nodules measure up to 3 mm.
Nonspecific. Non-contrast chest CT can be considered in 12 months if
patient is high-risk. This recommendation follows the consensus
statement: Guidelines for Management of Incidental Pulmonary Nodules
Detected on CT Images: From the [HOSPITAL] 7825; Radiology

## 2020-06-23 ENCOUNTER — Other Ambulatory Visit: Payer: Self-pay | Admitting: Primary Care

## 2020-06-23 DIAGNOSIS — G4700 Insomnia, unspecified: Secondary | ICD-10-CM

## 2020-06-24 NOTE — Telephone Encounter (Signed)
Patient needs her CPE scheduled with me for August this year. She will also be due for a repeat lung cancer screening CT, we can discuss at that time.

## 2020-06-24 NOTE — Telephone Encounter (Signed)
Last office visit 08/03/2019 for follow up.  Last refilled 03/24/2020 for #90 with no refills.  No future appointments.

## 2020-07-04 ENCOUNTER — Encounter: Payer: Self-pay | Admitting: Rheumatology

## 2020-07-04 DIAGNOSIS — L4050 Arthropathic psoriasis, unspecified: Secondary | ICD-10-CM

## 2020-07-04 DIAGNOSIS — H209 Unspecified iridocyclitis: Secondary | ICD-10-CM

## 2020-07-05 MED ORDER — HUMIRA (2 PEN) 40 MG/0.4ML ~~LOC~~ AJKT: AUTO-INJECTOR | SUBCUTANEOUS | 0 refills | Status: DC

## 2020-07-05 NOTE — Telephone Encounter (Signed)
Called patient made CPE will fast for 4 hours before appointment

## 2020-07-05 NOTE — Telephone Encounter (Signed)
Next Visit: due May 2022   Last Visit: 03/22/2020   Last Fill: 03/25/2020   DX: Psoriatic arthropathy    Current Dose per office note 03/22/2020, Humira 40 mg sq injections once every 14 days   Labs: 03/22/2020, Labs are stable.   TB Gold: 06/02/2019, negative  Message sent to patient via my chart to advise she is due to update labs and due for a follow up visit.    Okay to refill Humira?

## 2020-07-11 ENCOUNTER — Telehealth: Payer: Self-pay | Admitting: Rheumatology

## 2020-07-11 ENCOUNTER — Telehealth: Payer: Self-pay

## 2020-07-11 NOTE — Telephone Encounter (Signed)
Beverlee Nims from [facility] left a voicemail stating they need patient's updated insurance benefits for her prescription of Humira.  It looks like [facility] benefits have terminated as of 06/30/20.  We have not been able to reach the patient to get her current insurance information.  Please call with [phone] or fax the front and back of the card to [phone] Attn: Patient ID [id number]

## 2020-07-11 NOTE — Telephone Encounter (Signed)
Attempted to contact the patient and left message for patient to call the office.  

## 2020-07-11 NOTE — Telephone Encounter (Signed)
Patient calling in reference to Humira. Patient was scheduled to take medication on Sunday, but is completely out.  #1.Patient requests a sample until she can talk to pharmacy here. Patient is in between insurances, and new one will go into effect July 20th.  #2. Patient would like to talk to someone about her options in getting her medication until then. Please call advise.

## 2020-07-11 NOTE — Telephone Encounter (Signed)
Beverlee Nims from [facility] left a voicemail stating they need patient's updated insurance benefits for her prescription of Humira.  It looks like [facility] benefits have terminated as of 06/30/20.  We have not been able to reach the patient to get her current insurance information.  Please call with [phone] or fax the front and back of the card to [phone] Attn: Patient ID [id number]

## 2020-07-11 NOTE — Telephone Encounter (Signed)
See previous phone note.  

## 2020-07-12 NOTE — Telephone Encounter (Signed)
Attempted to contact patient and left message for patient to call the office.  

## 2020-07-13 NOTE — Telephone Encounter (Signed)
Patient advised we can provide her with a sample of Humira. Patient advised to call the office as soon as she has her new insurance information so we can do a new PA on Humira. Patient expressed understanding. Patient will come to pick up sample Thursday or Friday.

## 2020-07-20 ENCOUNTER — Other Ambulatory Visit (HOSPITAL_COMMUNITY): Payer: Self-pay

## 2020-08-08 ENCOUNTER — Telehealth: Payer: Self-pay | Admitting: Pharmacy Technician

## 2020-08-08 NOTE — Telephone Encounter (Signed)
Patient has new insurance- [employer]  Submitted a Prior Authorization request to [employer] for HUMIRA via CoverMyMeds. Will update once we receive a response.   (Key: [id number])

## 2020-08-09 ENCOUNTER — Ambulatory Visit: Payer: Managed Care, Other (non HMO) | Admitting: Primary Care

## 2020-08-10 ENCOUNTER — Other Ambulatory Visit (HOSPITAL_COMMUNITY): Payer: Self-pay

## 2020-08-10 NOTE — Telephone Encounter (Signed)
Received notification from [place]  regarding a prior authorization for [place]. Authorization has been APPROVED from 08/10/20 to 08/10/21.   Patient must fill through [facility]: [phone]  Authorization # [place] patient, left detailed message.

## 2020-08-11 ENCOUNTER — Telehealth: Payer: Self-pay

## 2020-08-11 NOTE — Telephone Encounter (Signed)
Unable to refill, patient due to update labs and due for a follow up visit.   Left message to advise patient she is due for labs and for a follow up visit.

## 2020-08-11 NOTE — Telephone Encounter (Signed)
Ken Caryl called stating a new prescription of Humira is needed for the patient.

## 2020-09-21 ENCOUNTER — Other Ambulatory Visit: Payer: Self-pay | Admitting: Primary Care

## 2020-09-21 DIAGNOSIS — E785 Hyperlipidemia, unspecified: Secondary | ICD-10-CM

## 2020-09-21 DIAGNOSIS — I10 Essential (primary) hypertension: Secondary | ICD-10-CM

## 2020-09-21 DIAGNOSIS — I700 Atherosclerosis of aorta: Secondary | ICD-10-CM

## 2020-09-21 DIAGNOSIS — G4700 Insomnia, unspecified: Secondary | ICD-10-CM

## 2020-09-21 DIAGNOSIS — F4323 Adjustment disorder with mixed anxiety and depressed mood: Secondary | ICD-10-CM

## 2020-09-27 ENCOUNTER — Other Ambulatory Visit: Payer: Self-pay | Admitting: Primary Care

## 2020-09-27 ENCOUNTER — Other Ambulatory Visit: Payer: Self-pay

## 2020-09-27 ENCOUNTER — Ambulatory Visit (INDEPENDENT_AMBULATORY_CARE_PROVIDER_SITE_OTHER): Payer: BC Managed Care – PPO | Admitting: Primary Care

## 2020-09-27 ENCOUNTER — Encounter: Payer: Self-pay | Admitting: Primary Care

## 2020-09-27 VITALS — BP 154/88 | HR 86 | Temp 97.6°F | Ht 59.0 in | Wt 108.0 lb

## 2020-09-27 DIAGNOSIS — J449 Chronic obstructive pulmonary disease, unspecified: Secondary | ICD-10-CM

## 2020-09-27 DIAGNOSIS — Z122 Encounter for screening for malignant neoplasm of respiratory organs: Secondary | ICD-10-CM

## 2020-09-27 DIAGNOSIS — J309 Allergic rhinitis, unspecified: Secondary | ICD-10-CM

## 2020-09-27 DIAGNOSIS — E785 Hyperlipidemia, unspecified: Secondary | ICD-10-CM | POA: Diagnosis not present

## 2020-09-27 DIAGNOSIS — I739 Peripheral vascular disease, unspecified: Secondary | ICD-10-CM

## 2020-09-27 DIAGNOSIS — Z1211 Encounter for screening for malignant neoplasm of colon: Secondary | ICD-10-CM

## 2020-09-27 DIAGNOSIS — L409 Psoriasis, unspecified: Secondary | ICD-10-CM

## 2020-09-27 DIAGNOSIS — I10 Essential (primary) hypertension: Secondary | ICD-10-CM | POA: Diagnosis not present

## 2020-09-27 DIAGNOSIS — G4700 Insomnia, unspecified: Secondary | ICD-10-CM | POA: Diagnosis not present

## 2020-09-27 DIAGNOSIS — R232 Flushing: Secondary | ICD-10-CM

## 2020-09-27 DIAGNOSIS — I700 Atherosclerosis of aorta: Secondary | ICD-10-CM | POA: Diagnosis not present

## 2020-09-27 DIAGNOSIS — F4323 Adjustment disorder with mixed anxiety and depressed mood: Secondary | ICD-10-CM

## 2020-09-27 MED ORDER — ATORVASTATIN CALCIUM 10 MG PO TABS: 10.0000 mg | ORAL_TABLET | Freq: Every day | ORAL | 3 refills | Status: DC

## 2020-09-27 MED ORDER — LEVOCETIRIZINE DIHYDROCHLORIDE 5 MG PO TABS: 5.0000 mg | ORAL_TABLET | Freq: Every evening | ORAL | 1 refills | Status: DC

## 2020-09-27 MED ORDER — METOPROLOL TARTRATE 25 MG PO TABS: 25.0000 mg | ORAL_TABLET | Freq: Two times a day (BID) | ORAL | 3 refills | Status: DC

## 2020-09-27 MED ORDER — FLUTICASONE PROPIONATE 50 MCG/ACT NA SUSP: 1.0000 | Freq: Two times a day (BID) | NASAL | 3 refills | Status: AC

## 2020-09-27 MED ORDER — HYDROCHLOROTHIAZIDE 12.5 MG PO TABS: 12.5000 mg | ORAL_TABLET | Freq: Every day | ORAL | 0 refills | Status: DC

## 2020-09-27 MED ORDER — CLOPIDOGREL BISULFATE 75 MG PO TABS: 75.0000 mg | ORAL_TABLET | Freq: Every day | ORAL | 3 refills | Status: DC

## 2020-09-27 MED ORDER — PAROXETINE HCL 40 MG PO TABS: 40.0000 mg | ORAL_TABLET | Freq: Every day | ORAL | 3 refills | Status: DC

## 2020-09-27 MED ORDER — AMLODIPINE BESYLATE 10 MG PO TABS: 10.0000 mg | ORAL_TABLET | Freq: Every day | ORAL | 3 refills | Status: DC

## 2020-09-27 MED ORDER — ALBUTEROL SULFATE HFA 108 (90 BASE) MCG/ACT IN AERS: 2.0000 | INHALATION_SPRAY | Freq: Four times a day (QID) | RESPIRATORY_TRACT | 0 refills | Status: DC | PRN

## 2020-09-27 NOTE — Assessment & Plan Note (Signed)
Denies concerns today.  

## 2020-09-27 NOTE — Assessment & Plan Note (Signed)
Doing well on Paxil 40 mg, continue same.

## 2020-09-27 NOTE — Assessment & Plan Note (Signed)
Continue clopidogrel 75 mg and atorvastatin 10 mg.  Repeat lipid panel pending.  LDL goal of less than 70.

## 2020-09-27 NOTE — Assessment & Plan Note (Signed)
Overall seems stable. Referral placed for lung cancer screening.  Continue albuterol inhaler as needed.

## 2020-09-27 NOTE — Assessment & Plan Note (Signed)
Following with rheumatology, she plans on setting up an appointment soon.

## 2020-09-27 NOTE — Patient Instructions (Signed)
Stop by the lab prior to leaving today. I will notify you of your results once received.   You will be contacted regarding your referral to lung cancer screening.  Please let us know if you have not been contacted within two weeks.   Start hydrochlorothiazide 12.5 mg once daily for blood pressure. Check your blood pressure at home as discussed.  Complete the lab appointment and blood pressure check in 2-3 weeks at [facility].   It was a pleasure to see you today!

## 2020-09-27 NOTE — Progress Notes (Signed)
Subjective:    Patient ID: [name] [name], female    DOB: 1960/12/29, 60 y.o.   MRN: [id number]  HPI  [name] [name] is a very pleasant 60 y.o. female with a history of hypertension, PAD, atherosclerosis of abdominal aorta, COPD, Psoriasis, Hyperlipidemia, restless leg syndrome, chronic back pain, anxiety and depression who presents today for follow up of chronic conditions and medication refills.   She declines mammogram, pap smear, colon cancer screening, flu shot, shingles shot today.   1) Essential Hypertension/Atherosclerosis: Currently managed on amlodipine 10 mg, metoprolol tartrate 25 mg BID, clopidogrel 75 mg, atorvastatin 10 mg. LDL goal of <70.  History of lower extremity angiography to the left stent placement.  She is checks her BP at home which runs 140's-150's/80's. She has noticed headaches over the last month.  She endorses compliance to both amlodipine and metoprolol.  2) Psoriasis: Following with rheumatology, previously managed on methotrexate and Humira, but she is no longer taking as she is due for a follow up visit with her rheumatologist.  She has noticed a return of her psoriasis since she has been off of her medication regimen.  3) Tobacco Abuse/COPD/Pulmonary Nodule: 2 mm right upper lobe pulmonary nodule and COPD on CT chest from June 2021. Managed on albuterol inhaler for which she uses once monthly on average. She doesn't like to use this too late in the day as it keeps her awake.   4) Anxiety/Depression/Insomnia: Currently prescribed paroxetine 40 mg, Ambien 10 mg. Anxiety and depression have improved since she changed her occupation. She continues to wake during the night 4-5 times nightly despite use of Ambien 10 mg.   Diagnosed with sleep apnea in 2020 per neurology, but she could not afford the CPAP machine at the time. She's woken herself up from sleep snoring. She plans on following back up with neurology soon.   BP Readings from Last 3 Encounters:   03/22/20 (!) 160/69  10/21/19 (!) 195/62  08/03/19 136/86      Review of Systems  Eyes:  Negative for visual disturbance.  Respiratory:  Negative for shortness of breath.   Cardiovascular:  Negative for chest pain.  Skin:        Return of psoriasis  Neurological:  Positive for headaches.  Psychiatric/Behavioral:  Positive for sleep disturbance. The patient is not nervous/anxious.         Past Medical History:  Diagnosis Date   Atypical chest pain    Back pain    chronic   Chronic insomnia    Depression    Eye abnormality    trouble seeing out of lt eye   GERD (gastroesophageal reflux disease)    Herpes zoster without complication 3/38/2505   Hyperlipidemia    Hypertension    PVD (peripheral vascular disease) ([facility])    segmental femeropoliteal disease   Tobacco abuse     Social History   Socioeconomic History   Marital status: Divorced    Spouse name: Not on file   Number of children: Not on file   Years of education: Not on file   Highest education level: Not on file  Occupational History   Not on file  Tobacco Use   Smoking status: Every Day    Packs/day: 1.00    Years: 45.00    Pack years: 45.00    Types: Cigarettes   Smokeless tobacco: Never  Vaping Use   Vaping Use: Never used  Substance and Sexual Activity   Alcohol use: No  Drug use: Never   Sexual activity: Not on file  Other Topics Concern   Not on file  Social History Narrative   Divorced with 4 children    Current Smoker 1ppd    No alcohol   No illicit drug use      Occupation: Tax Teaching laboratory technician Determinants of Radio broadcast assistant Strain: Not on file  Food Insecurity: Not on file  Transportation Needs: Not on file  Physical Activity: Not on file  Stress: Not on file  Social Connections: Not on file  Intimate Partner Violence: Not on file    Past Surgical History:  Procedure Laterality Date   BUNIONECTOMY  1996   bilateral   CARDIAC CATHETERIZATION N/A  11/01/2014   Procedure: Left Heart Cath and Coronary Angiography;  Surgeon: Dionisio David, MD;  Location: [facility];  Service: Cardiovascular;  Laterality: N/A;   CERVICAL DISCECTOMY  06/21/2006    and fusion    [place]   ENDOMETRIAL ABLATION  June 2010   LOWER EXTREMITY ANGIOGRAPHY Left 08/05/2017   Procedure: LOWER EXTREMITY ANGIOGRAPHY;  Surgeon: Algernon Huxley, MD;  Location: [facility];  Service: Cardiovascular;  Laterality: Left;   NASAL SINUS SURGERY  1999   TUBAL LIGATION     WRIST SURGERY  2005   right wrist    Family History  Problem Relation Age of Onset   Alcohol abuse Father        deceased age 27   Emphysema Mother        deceased age 19   ADD / ADHD Son    Healthy Son    Asthma Sister    ADD / ADHD Son    Healthy Son    Healthy Son    Healthy Daughter     No Known Allergies  Current Outpatient Medications on File Prior to Visit  Medication Sig Dispense Refill   albuterol (VENTOLIN HFA) 108 (90 Base) MCG/ACT inhaler Inhale 2 puffs into the lungs every 6 (six) hours as needed for wheezing or shortness of breath. 1 Inhaler 0   amLODipine (NORVASC) 10 MG tablet Take 1 tablet (10 mg total) by mouth daily. For blood pressure. Office visit required for further refills. 30 tablet 0   atorvastatin (LIPITOR) 10 MG tablet Take 1 tablet (10 mg total) by mouth daily. For cholesterol. Office visit required for further refills. 30 tablet 0   clopidogrel (PLAVIX) 75 MG tablet Take 1 tablet (75 mg total) by mouth daily. Office visit required for further refills. 30 tablet 0   metoprolol tartrate (LOPRESSOR) 25 MG tablet Take 1 tablet (25 mg total) by mouth 2 (two) times daily. For blood pressure. 180 tablet 3   naproxen sodium (ALEVE) 220 MG tablet Take 220 mg by mouth 2 (two) times daily as needed.     PARoxetine (PAXIL) 40 MG tablet Take 1 tablet (40 mg total) by mouth daily. For anxiety/depression. Office visit required for further refills. 30  tablet 0   zolpidem (AMBIEN) 10 MG tablet Take 1 tablet (10 mg total) by mouth at bedtime as needed for sleep. Office visit required for further refills. 30 tablet 0   Adalimumab (HUMIRA PEN) 40 MG/0.4ML PNKT INJECT 40 MG UNDER THE SKIN EVERY 14 DAYS (Patient not taking: Reported on 0/30/0923) 2 each 0   folic acid (FOLVITE) 1 MG tablet Take 1 tablet (1 mg total) by mouth daily. (Patient not taking:  Reported on 09/27/2020) 90 tablet 3   leflunomide (ARAVA) 10 MG tablet Take 1 tablet po daily x 2 weeks. If labs are stable increase to 2 tablets po daily. (Patient not taking: No sig reported) 60 tablet 0   methotrexate (RHEUMATREX) 2.5 MG tablet Take 6 tabs by mouth once weekly. Caution:Chemotherapy. Protect from light. (Patient not taking: Reported on 09/27/2020) 24 tablet 2   No current facility-administered medications on file prior to visit.    Pulse 86   Temp 97.6 F (36.4 C) (Temporal)   Ht 4\' 11"  (1.499 m)   Wt 108 lb (49 kg)   SpO2 97%   BMI 21.81 kg/m  Objective:   Physical Exam Cardiovascular:     Rate and Rhythm: Normal rate and regular rhythm.  Pulmonary:     Effort: Pulmonary effort is normal.     Breath sounds: Normal breath sounds.  Musculoskeletal:     Cervical back: Neck supple.  Skin:    General: Skin is warm and dry.  Psychiatric:        Mood and Affect: Mood normal.          Assessment & Plan:      This visit occurred during the SARS-CoV-2 public health emergency.  Safety protocols were in place, including screening questions prior to the visit, additional usage of staff PPE, and extensive cleaning of exam room while observing appropriate contact time as indicated for disinfecting solutions.

## 2020-09-27 NOTE — Assessment & Plan Note (Signed)
Chronic.  Refills provided for Flonase and Xyzal.

## 2020-09-27 NOTE — Assessment & Plan Note (Signed)
History of left lower extremity angiography with stent placement.  Continue clopidogrel 75 mg daily and atorvastatin 10 mg daily.  Repeat lipid panel pending.

## 2020-09-27 NOTE — Assessment & Plan Note (Signed)
Continue atorvastatin 10 mg. Repeat lipid panel pending.  

## 2020-09-27 NOTE — Assessment & Plan Note (Signed)
Above goal in the office today, also with home readings.  Do suspect untreated sleep apnea to be contributing.  Continue amlodipine 10 mg daily, metoprolol tartrate 25 mg twice daily.  Add hydrochlorothiazide 12.5 mg daily.  We will get her set up for a blood pressure check nurse visit and BMP in 2 to 3 weeks.

## 2020-09-27 NOTE — Assessment & Plan Note (Signed)
Uncontrolled.  Suspect untreated sleep apnea to be contributing.  Continue Ambien 10 mg for now, but strongly advise she connect with neurology for evaluation and CPAP machine.

## 2020-09-28 ENCOUNTER — Telehealth: Payer: Self-pay | Admitting: Neurology

## 2020-09-28 LAB — COMPREHENSIVE METABOLIC PANEL
ALT: 11 U/L (ref 0–35)
AST: 15 U/L (ref 0–37)
Albumin: 4.3 g/dL (ref 3.5–5.2)
Alkaline Phosphatase: 96 U/L (ref 39–117)
BUN: 12 mg/dL (ref 6–23)
CO2: 27 mEq/L (ref 19–32)
Calcium: 9.7 mg/dL (ref 8.4–10.5)
Chloride: 103 mEq/L (ref 96–112)
Creatinine, Ser: 0.9 mg/dL (ref 0.40–1.20)
GFR: 68.69 mL/min (ref >60.00)
Glucose, Bld: 88 mg/dL (ref 70–99)
Potassium: 4.3 mEq/L (ref 3.5–5.1)
Sodium: 137 mEq/L (ref 135–145)
Total Bilirubin: 0.6 mg/dL (ref 0.2–1.2)
Total Protein: 6.9 g/dL (ref 6.0–8.3)

## 2020-09-28 LAB — LIPID PANEL
Cholesterol: 117 mg/dL (ref 0–200)
HDL: 38.5 mg/dL — ABNORMAL LOW (ref >39.00)
LDL Cholesterol: 60 mg/dL (ref 0–99)
NonHDL: 78.97
Total CHOL/HDL Ratio: 3
Triglycerides: 93 mg/dL (ref 0.0–149.0)
VLDL: 18.6 mg/dL (ref 0.0–40.0)

## 2020-09-28 LAB — HEMOGLOBIN A1C: Hgb A1c MFr Bld: 5.4 % (ref 4.6–6.5)

## 2020-09-28 NOTE — Telephone Encounter (Signed)
Spoke with patient.  It has been 2 years since we saw her.  Patient has been scheduled for an appointment to get updated clinical information this coming Monday, October 3 at 9:45 AM arrival 15 to 30 minutes early.  Patient verbalized appreciation for the call.

## 2020-09-28 NOTE — Progress Notes (Deleted)
Office Visit Note  Patient: [name] [name]             Date of Birth: 29-Nov-1960           MRN: [id number]             PCP: Pleas Koch, NP Referring: Pleas Koch, NP Visit Date: 10/04/2020 Occupation: @GUAROCC @  Subjective:  No chief complaint on file.   History of Present Illness: [name] [name] is a 60 y.o. female ***   Activities of Daily Living:  Patient reports morning stiffness for *** {minute/hour:19697}.   Patient {ACTIONS;DENIES/REPORTS:21021675::"Denies"} nocturnal pain.  Difficulty dressing/grooming: {ACTIONS;DENIES/REPORTS:21021675::"Denies"} Difficulty climbing stairs: {ACTIONS;DENIES/REPORTS:21021675::"Denies"} Difficulty getting out of chair: {ACTIONS;DENIES/REPORTS:21021675::"Denies"} Difficulty using hands for taps, buttons, cutlery, and/or writing: {ACTIONS;DENIES/REPORTS:21021675::"Denies"}  No Rheumatology ROS completed.   PMFS History:  Patient Active Problem List   Diagnosis Date Noted   High risk medication use 06/29/2019   Preventative health care 06/12/2018   Psoriasis 04/06/2016   Atherosclerosis of abdominal aorta ([place]) 07/16/2014   Adjustment disorder with mixed anxiety and depressed mood 07/16/2014   Cervical postlaminectomy syndrome 01/10/2012   Hot flashes 11/02/2011   Chronic fatigue 11/02/2011   Chronic back pain 08/17/2011   COPD (chronic obstructive pulmonary disease) ([place]) 04/21/2010   Allergic rhinitis 07/01/2009   STRESS INCONTINENCE 03/16/2008   PAD (peripheral artery disease) ([place]) 07/10/2007   LEG PAIN, BILATERAL 06/24/2007   Hyperlipidemia 01/01/2007   TOBACCO ABUSE 01/01/2007   INSOMNIA, CHRONIC 01/01/2007   RESTLESS LEG SYNDROME 01/01/2007   Essential hypertension 01/01/2007    Past Medical History:  Diagnosis Date   Atypical chest pain    Back pain    chronic   Chronic insomnia    Depression    Eye abnormality    trouble seeing out of lt eye   GERD (gastroesophageal reflux disease)    Herpes zoster  without complication 2/40/9735   Hyperlipidemia    Hypertension    PVD (peripheral vascular disease) ([place])    segmental femeropoliteal disease   Tobacco abuse     Family History  Problem Relation Age of Onset   Alcohol abuse Father        deceased age 4   Emphysema Mother        deceased age 93   ADD / ADHD Son    Healthy Son    Asthma Sister    ADD / ADHD Son    Healthy Son    Healthy Son    Healthy Daughter    Past Surgical History:  Procedure Laterality Date   BUNIONECTOMY  1996   bilateral   CARDIAC CATHETERIZATION N/A 11/01/2014   Procedure: Left Heart Cath and Coronary Angiography;  Surgeon: Dionisio David, MD;  Location: [facility];  Service: Cardiovascular;  Laterality: N/A;   CERVICAL DISCECTOMY  06/21/2006    and fusion    [place]   ENDOMETRIAL ABLATION  June 2010   LOWER EXTREMITY ANGIOGRAPHY Left 08/05/2017   Procedure: LOWER EXTREMITY ANGIOGRAPHY;  Surgeon: Algernon Huxley, MD;  Location: [facility];  Service: Cardiovascular;  Laterality: Left;   NASAL SINUS SURGERY  1999   TUBAL LIGATION     WRIST SURGERY  2005   right wrist   Social History   Social History Narrative   Divorced with 4 children    Current Smoker 1ppd    No alcohol   No illicit drug use      Occupation: Tax Scientist, forensic  Immunization History  Administered Date(s) Administered   Td 06/21/2003   Tdap 06/12/2018     Objective: Vital Signs: There were no vitals taken for this visit.   Physical Exam   Musculoskeletal Exam: ***  CDAI Exam: CDAI Score: -- Patient Global: --; Provider Global: -- Swollen: --; Tender: -- Joint Exam 10/04/2020   No joint exam has been documented for this visit   There is currently no information documented on the homunculus. Go to the Rheumatology activity and complete the homunculus joint exam.  Investigation: No additional findings.  Imaging: No results found.  Recent Labs: Lab Results  Component  Value Date   WBC 11.7 (H) 03/22/2020   HGB 13.9 03/22/2020   PLT 219 03/22/2020   NA 137 09/27/2020   K 4.3 09/27/2020   CL 103 09/27/2020   CO2 27 09/27/2020   GLUCOSE 88 09/27/2020   BUN 12 09/27/2020   CREATININE 0.90 09/27/2020   BILITOT 0.6 09/27/2020   ALKPHOS 96 09/27/2020   AST 15 09/27/2020   ALT 11 09/27/2020   PROT 6.9 09/27/2020   ALBUMIN 4.3 09/27/2020   CALCIUM 9.7 09/27/2020   GFRAA 87 03/22/2020   QFTBGOLDPLUS NEGATIVE 06/02/2019    Speciality Comments: No specialty comments available.  Procedures:  No procedures performed Allergies: Patient has no known allergies.   Assessment / Plan:     Visit Diagnoses: No diagnosis found.  Orders: No orders of the defined types were placed in this encounter.  No orders of the defined types were placed in this encounter.   Face-to-face time spent with patient was *** minutes. Greater than 50% of time was spent in counseling and coordination of care.  Follow-Up Instructions: No follow-ups on file.   Earnestine Mealing, CMA  Note - This record has been created using Editor, commissioning.  Chart creation errors have been sought, but may not always  have been located. Such creation errors do not reflect on  the standard of medical care.

## 2020-09-28 NOTE — Telephone Encounter (Signed)
Pt called states she is ready to get her CPAP machine now states when she was trying to do it 2 years ago she did not have the funds to do it. She is not sure if she would have to do the process all over again. Pt requesting a call back.

## 2020-10-03 ENCOUNTER — Encounter: Payer: Self-pay | Admitting: Neurology

## 2020-10-03 ENCOUNTER — Other Ambulatory Visit: Payer: Self-pay

## 2020-10-03 ENCOUNTER — Ambulatory Visit: Payer: BC Managed Care – PPO | Admitting: Neurology

## 2020-10-03 VITALS — BP 139/62 | Ht 59.0 in | Wt 109.0 lb

## 2020-10-03 DIAGNOSIS — F17200 Nicotine dependence, unspecified, uncomplicated: Secondary | ICD-10-CM

## 2020-10-03 DIAGNOSIS — R351 Nocturia: Secondary | ICD-10-CM

## 2020-10-03 DIAGNOSIS — G4733 Obstructive sleep apnea (adult) (pediatric): Secondary | ICD-10-CM

## 2020-10-03 DIAGNOSIS — R519 Headache, unspecified: Secondary | ICD-10-CM

## 2020-10-03 DIAGNOSIS — G4700 Insomnia, unspecified: Secondary | ICD-10-CM

## 2020-10-03 NOTE — Patient Instructions (Signed)
It was nice to see you again today.  As discussed, we will set you up at home with a so called autoPAP machine for treatment of your overall mild obstructive sleep apnea.  Once you have your set up date, you will need to be seen in clinic in 31 to 89 days after starting treatment with the AutoPap machine.  Please call us to make that appointment so we can fit you in within that window.  Please use your autoPAP regularly. While your insurance requires that you use PAP at least 4 hours each night on 70% of the nights, I recommend, that you not skip any nights and use it throughout the night if you can. Getting used to PAP and staying with the treatment long term does take time and patience and discipline. Untreated obstructive sleep apnea when it is moderate to severe can have an adverse impact on cardiovascular health and raise her risk for heart disease, arrhythmias, hypertension, congestive heart failure, stroke and diabetes. Untreated obstructive sleep apnea causes sleep disruption, nonrestorative sleep, and sleep deprivation. This can have an impact on your day to day functioning and cause daytime sleepiness and impairment of cognitive function, memory loss, mood disturbance, and problems focussing. Using PAP regularly can improve these symptoms.

## 2020-10-03 NOTE — Progress Notes (Signed)
Subjective:    Patient ID: [name] [name] is a 60 y.o. female.  HPI    Interim history:   [name] [name] is a 60 year old right-handed woman with an underlying medical history of reflux disease, hypertension, hyperlipidemia, peripheral vascular disease, psoriasis, psoriatic arthritis, smoking, depression, and back pain, who presents for follow-up consultation of her obstructive sleep apnea, she is unaccompanied today and presents after a gap of over 2 years.  I first met her at the request of her primary care nurse practitioner on 07/30/2018, at which time she reported a longstanding history of difficulty initiating and maintaining sleep.  She had also been treated for restless leg syndrome years ago.  She had been on Ambien off and on for nearly 20 years.  She was advised to proceed with a sleep study.  She had a home sleep test on 08/20/2018, which indicated mild obstructive sleep apnea with an AHI of 14.7/h, O2 nadir 89%.  Given her chronic sleep difficulty, she was advised to proceed with AutoPap therapy.  She did not pursue treatment at the time.  Today, 10/30/2020: She reports ongoing issues with sleep maintenance.  She continues to take Ambien, currently 10 mg nightly.  She has been on Humira since July 2021 but lost coverage for it since June of this year, she is waiting to get back on Humira and methotrexate which was really helpful for her psoriatic arthritis.  She has a bedtime of around 11, rise time of around 8:30 AM.  She has nocturia about once per average night, has had the occasional morning headache for which she takes over-the-counter Aleve.  She lives with her sister.  She is working on smoking cessation, currently smokes about 1 pack/day.  She drinks caffeine in the form of week coffee, about a pot per day, 8 cups per her estimate.  She drinks decaf tea and no soda, no alcohol.  She is interested in starting AutoPap therapy at this time.  Previously:   07/30/18: (She) reports a  longstanding history of difficulty initiating and maintaining sleep.  She was treated for restless leg symptoms in the past and took medication until about 10 years ago.  She had a sleep study many years ago which was negative for sleep apnea.  Sleep study results are not available for my review today.  She is currently taking Ambien 5 mg as needed.  Prior to that she has been on trazodone.  She has been on Ambien off and on for years, since approximately 2001.  She felt that the trazodone did not help at all.  In the past year she has had more difficulty staying asleep.  She wakes up with no particular reason in the middle of the night.  She tries to keep her bedtime of around midnight, rise time between 930 and 10 AM.  She has to be at work at 28.  She works from home, and Therapist, art.  She smokes about a pack per day.  She drinks caffeine in the form of coffee, 5 to 6 cups of week coffee per day no soda and some decaf tea.  She does snore per sister's report.  Sister lives with her, she has another sister.  She has no family history of sleep apnea but reports that her sister does snore.  She reports that her son age 82.  Has significant difficulty falling asleep.  She has a total of 4 grown children, 10 grandchildren.  She is divorced since 2006.  When she  had a sleep study she was told that she had twitching in her sleep.  Her ex-husband had complained about it as well.  She denies any telltale symptoms of restless leg syndrome however.  She does not have night to night nocturia but has had the occasional morning headache which is a frontal, dull type of headache, she has no history of migraines.   Her Past Medical History Is Significant For: Past Medical History:  Diagnosis Date   Atypical chest pain    Back pain    chronic   Chronic insomnia    Depression    Eye abnormality    trouble seeing out of lt eye   GERD (gastroesophageal reflux disease)    Herpes zoster without complication  0/25/8527   Hyperlipidemia    Hypertension    PVD (peripheral vascular disease) ([place])    segmental femeropoliteal disease   Tobacco abuse     Her Past Surgical History Is Significant For: Past Surgical History:  Procedure Laterality Date   BUNIONECTOMY  1996   bilateral   CARDIAC CATHETERIZATION N/A 11/01/2014   Procedure: Left Heart Cath and Coronary Angiography;  Surgeon: Dionisio David, MD;  Location: [facility];  Service: Cardiovascular;  Laterality: N/A;   CERVICAL DISCECTOMY  06/21/2006    and fusion    [place]   ENDOMETRIAL ABLATION  June 2010   LOWER EXTREMITY ANGIOGRAPHY Left 08/05/2017   Procedure: LOWER EXTREMITY ANGIOGRAPHY;  Surgeon: Algernon Huxley, MD;  Location: [facility];  Service: Cardiovascular;  Laterality: Left;   NASAL SINUS SURGERY  1999   TUBAL LIGATION     WRIST SURGERY  2005   right wrist    Her Family History Is Significant For: Family History  Problem Relation Age of Onset   Emphysema Mother        deceased age 26   Alcohol abuse Father        deceased age 78   Asthma Sister    Healthy Daughter    ADD / ADHD Son    Healthy Son    ADD / ADHD Son    Healthy Son    Healthy Son    Sleep apnea Neg Hx     Her Social History Is Significant For: Social History   Socioeconomic History   Marital status: Divorced    Spouse name: Not on file   Number of children: Not on file   Years of education: Not on file   Highest education level: Not on file  Occupational History   Not on file  Tobacco Use   Smoking status: Every Day    Packs/day: 1.00    Years: 45.00    Pack years: 45.00    Types: Cigarettes   Smokeless tobacco: Never  Vaping Use   Vaping Use: Never used  Substance and Sexual Activity   Alcohol use: No   Drug use: Never   Sexual activity: Not on file  Other Topics Concern   Not on file  Social History Narrative   Divorced with 4 children    Current Smoker 1ppd    No alcohol   No illicit drug  use      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   amLODipine (NORVASC) 10 MG tablet Take 1 tablet (10 mg total) by mouth daily. For blood pressure.   atorvastatin (LIPITOR) 10 MG tablet Take 1 tablet (10 mg total) by mouth daily. For cholesterol.   clopidogrel (PLAVIX) 75 MG tablet Take 1 tablet (75 mg total) by mouth daily.   fluticasone (FLONASE) 50 MCG/ACT nasal spray Place 1 spray into both nostrils 2 (two) times daily.   hydrochlorothiazide (HYDRODIURIL) 12.5 MG tablet Take 1 tablet (12.5 mg total) by mouth daily. For blood pressure.   levocetirizine (XYZAL) 5 MG tablet Take 1 tablet (5 mg total) by mouth every evening. For allergies.   metoprolol tartrate (LOPRESSOR) 25 MG tablet Take 1 tablet (25 mg total) by mouth 2 (two) times daily. For blood pressure.   naproxen sodium (ALEVE) 220 MG tablet Take 220 mg by mouth 2 (two) times daily as needed.   PARoxetine (PAXIL) 40 MG tablet Take 1 tablet (40 mg total) by mouth daily. For anxiety/depression.   zolpidem (AMBIEN) 10 MG tablet Take 1 tablet (10 mg total) by mouth at bedtime as needed for sleep. Office visit required for further refills.   Adalimumab (HUMIRA PEN) 40 MG/0.4ML PNKT INJECT 40 MG UNDER THE SKIN EVERY 14 DAYS (Patient not taking: No sig reported)   folic acid (FOLVITE) 1 MG tablet Take 1 tablet (1 mg total) by mouth daily. (Patient not taking: No sig reported)   leflunomide (ARAVA) 10 MG tablet Take 1 tablet po  daily x 2 weeks. If labs are stable increase to 2 tablets po daily. (Patient not taking: No sig reported)   methotrexate (RHEUMATREX) 2.5 MG tablet Take 6 tabs by mouth once weekly. Caution:Chemotherapy. Protect from light. (Patient not taking: No sig reported)   No facility-administered encounter medications on file as of 10/03/2020.  :  Review of Systems:  Out of a complete 14 point review of systems, all are reviewed and negative with the exception of these symptoms as listed below:   Review of Systems  Neurological:        Pt is here for CPAP follow up.Pt states she has no CPAP and machine is not ordered . That's why patient is here to get her CPAP machine ordered.   Objective:  Neurological Exam  Physical Exam Physical Examination:   Vitals:   10/03/20 0935  BP: 139/62    General Examination: The patient is a very pleasant 60 y.o. female in no acute distress. She appears well-developed and well-nourished and well groomed.   HEENT: Normocephalic, atraumatic, pupils are equal, round and reactive to light, extraocular tracking is well-preserved, corrective eyeglasses in place.  Hearing is grossly intact.  Oropharynx examination reveals a moderately crowded airway secondary to small airway and larger uvula noted, tonsils are small, Mallampati class II.  She has full dentures.  Mild to moderate mouth dryness noted.  Tongue protrudes centrally and palate elevates symmetrically.    Chest: Clear to auscultation without wheezing, rhonchi or crackles noted.   Heart: S1+S2+0, regular and normal without murmurs, rubs or gallops noted.    Abdomen: Soft, non-tender and non-distended.   Extremities: There is no pitting edema in the distal lower  extremities bilaterally.    Skin: Warm and dry without trophic changes noted.   Musculoskeletal: exam reveals mild arthritic changes in both hands, particularly right ring finger.     Neurologically:  Mental status: The patient is awake, alert  and oriented in all 4 spheres. Her immediate and remote memory, attention, language skills and fund of knowledge are appropriate. There is no evidence of aphasia, agnosia, apraxia or anomia. Speech is clear with normal prosody and enunciation. Thought process is linear. Mood is normal and affect is normal.  Cranial nerves II - XII are as described above under HEENT exam. In addition: shoulder shrug is normal with equal shoulder height noted. Motor exam: Normal bulk, strength and tone is noted. There is no drift, tremor or rebound. Fine motor skills and coordination: grossly intact.  Cerebellar testing: No dysmetria or intention. There is no truncal or gait ataxia.  Sensory exam: intact to light touch.  Gait, station and balance: She stands easily. No veering to one side is noted. No leaning to one side is noted. Posture is age-appropriate and stance is narrow based. Gait shows normal stride length and normal pace.  Slight insecurity with turns.  Assessment and Plan:  In summary, [name] [name] is a very pleasant 60 year old female with an underlying medical history of reflux disease, hypertension, hyperlipidemia, peripheral vascular disease, psoriasis, psoriatic arthritis, smoking, depression, and back pain, who presents for evaluation of her sleep disturbance.  She was diagnosed with mild obstructive sleep apnea in 2020.  She had a home sleep test in August.  She has ongoing difficulty maintaining sleep.  We talked about her sleep related concerns again today.  We talked about the importance of healthy lifestyle, smoking cessation, good hydration with water, keeping with sleep hygiene and a steady schedule.  She is agreeable to pursuing AutoPap therapy for her obstructive sleep apnea.  I will place an order for AutoPap therapy and we will send the order to a local DME provider.  Once she has a set up date, she is advised to call us to make a follow-up appointment so we can see her within the insurance  required window of 31 to 89 days after starting treatment.  She is advised to call us if she has not heard from a local DME company within the next week or 2.  I answered all her questions today and she was in agreement with the plan.

## 2020-10-04 ENCOUNTER — Ambulatory Visit: Payer: BC Managed Care – PPO | Admitting: Physician Assistant

## 2020-10-04 DIAGNOSIS — F17200 Nicotine dependence, unspecified, uncomplicated: Secondary | ICD-10-CM

## 2020-10-04 DIAGNOSIS — Z8639 Personal history of other endocrine, nutritional and metabolic disease: Secondary | ICD-10-CM

## 2020-10-04 DIAGNOSIS — M461 Sacroiliitis, not elsewhere classified: Secondary | ICD-10-CM

## 2020-10-04 DIAGNOSIS — R778 Other specified abnormalities of plasma proteins: Secondary | ICD-10-CM

## 2020-10-04 DIAGNOSIS — M961 Postlaminectomy syndrome, not elsewhere classified: Secondary | ICD-10-CM

## 2020-10-04 DIAGNOSIS — Z1589 Genetic susceptibility to other disease: Secondary | ICD-10-CM

## 2020-10-04 DIAGNOSIS — M19041 Primary osteoarthritis, right hand: Secondary | ICD-10-CM

## 2020-10-04 DIAGNOSIS — I10 Essential (primary) hypertension: Secondary | ICD-10-CM

## 2020-10-04 DIAGNOSIS — J449 Chronic obstructive pulmonary disease, unspecified: Secondary | ICD-10-CM

## 2020-10-04 DIAGNOSIS — M19071 Primary osteoarthritis, right ankle and foot: Secondary | ICD-10-CM

## 2020-10-04 DIAGNOSIS — Z79899 Other long term (current) drug therapy: Secondary | ICD-10-CM

## 2020-10-04 DIAGNOSIS — L4050 Arthropathic psoriasis, unspecified: Secondary | ICD-10-CM

## 2020-10-04 DIAGNOSIS — Z840 Family history of diseases of the skin and subcutaneous tissue: Secondary | ICD-10-CM

## 2020-10-04 DIAGNOSIS — H209 Unspecified iridocyclitis: Secondary | ICD-10-CM

## 2020-10-04 DIAGNOSIS — F4323 Adjustment disorder with mixed anxiety and depressed mood: Secondary | ICD-10-CM

## 2020-10-04 DIAGNOSIS — I739 Peripheral vascular disease, unspecified: Secondary | ICD-10-CM

## 2020-10-04 DIAGNOSIS — I700 Atherosclerosis of aorta: Secondary | ICD-10-CM

## 2020-10-04 DIAGNOSIS — L409 Psoriasis, unspecified: Secondary | ICD-10-CM

## 2020-10-04 DIAGNOSIS — Z8719 Personal history of other diseases of the digestive system: Secondary | ICD-10-CM

## 2020-10-11 ENCOUNTER — Ambulatory Visit: Payer: BC Managed Care – PPO

## 2020-10-11 ENCOUNTER — Other Ambulatory Visit: Payer: BC Managed Care – PPO

## 2020-10-12 ENCOUNTER — Encounter: Payer: Self-pay | Admitting: Neurology

## 2020-10-12 NOTE — Telephone Encounter (Signed)
CM sent for cpap orders.   Marchelle Gearing Sent to Brandon Melnick, RN  got it!    Thanks!

## 2020-10-19 ENCOUNTER — Other Ambulatory Visit: Payer: Self-pay | Admitting: Primary Care

## 2020-10-19 DIAGNOSIS — I10 Essential (primary) hypertension: Secondary | ICD-10-CM

## 2020-10-19 DIAGNOSIS — G4700 Insomnia, unspecified: Secondary | ICD-10-CM

## 2020-10-24 NOTE — Progress Notes (Signed)
Office Visit Note  Patient: [name] [name]             Date of Birth: 01-14-1960           MRN: [id number]             PCP: Pleas Koch, NP Referring: Pleas Koch, NP Visit Date: 10/25/2020 Occupation: '@GUAROCC' @  Subjective:  Pain in multiple joints  History of Present Illness: [name] [name] is a 60 y.o. female with history of psoriatic arthritis and osteoarthritis.  Patient reports that earlier this summer she switched jobs and insurance so she had lost follow-up and has been off of Humira and methotrexate since June.  She was started on methotrexate at her last office visit on 03/22/2020.  She was taking methotrexate 6 tablets weekly for about 3 months and had started to notice improvement in her symptoms prior to having to discontinue.  She states that she experiencing fatigue after the dose of methotrexate but otherwise was tolerating it without any additional side effects. She states that she is currently having a flare of psoriatic arthritis as well as psoriasis.  She has developed new patches in her years, on her torso, and on bilateral lower extremities.  She has not been using any topical steroids recently.  She states that in August she had a flare of uveitis in her left eye at which time she used prednisone drops to alleviate her symptoms.  She has been experiencing migratory arthralgias and intermittent joint swelling over the past couple of months.  She is currently having SI joint pain bilaterally as well as discomfort in her tailbone.  She denies any recent falls or injuries.  She states that she has been experiencing intermittent discomfort in her shoulders especially the right shoulder.  Last week she was having difficulty raising her arm due to severity of pain in her right shoulder.  She has noticed intermittent swelling in her right hand.   Activities of Daily Living:  Patient reports morning stiffness for 1-1.5 hours.   Patient Reports nocturnal pain.   Difficulty dressing/grooming: Denies Difficulty climbing stairs: Denies Difficulty getting out of chair: Reports Difficulty using hands for taps, buttons, cutlery, and/or writing: Denies  Review of Systems  Constitutional:  Positive for fatigue.  HENT:  Positive for mouth dryness. Negative for mouth sores and nose dryness.   Eyes:  Negative for pain, itching and dryness.  Respiratory:  Negative for shortness of breath and difficulty breathing.   Cardiovascular:  Negative for chest pain and palpitations.  Gastrointestinal:  Positive for diarrhea. Negative for blood in stool and constipation.  Endocrine: Negative for increased urination.  Genitourinary:  Negative for difficulty urinating.  Musculoskeletal:  Positive for joint pain, joint pain, joint swelling and morning stiffness. Negative for myalgias, muscle tenderness and myalgias.  Skin:  Positive for color change and rash.  Allergic/Immunologic: Negative for susceptible to infections.  Neurological:  Positive for headaches. Negative for dizziness, numbness and memory loss.  Hematological:  Positive for bruising/bleeding tendency.  Psychiatric/Behavioral:  Negative for confusion.    PMFS History:  Patient Active Problem List   Diagnosis Date Noted   High risk medication use 06/29/2019   Preventative health care 06/12/2018   Psoriasis 04/06/2016   Atherosclerosis of abdominal aorta ([place]) 07/16/2014   Adjustment disorder with mixed anxiety and depressed mood 07/16/2014   Cervical postlaminectomy syndrome 01/10/2012   Hot flashes 11/02/2011   Chronic fatigue 11/02/2011   Chronic back pain 08/17/2011  COPD (chronic obstructive pulmonary disease) ([place]) 04/21/2010   Allergic rhinitis 07/01/2009   STRESS INCONTINENCE 03/16/2008   PAD (peripheral artery disease) ([place]) 07/10/2007   LEG PAIN, BILATERAL 06/24/2007   Hyperlipidemia 01/01/2007   TOBACCO ABUSE 01/01/2007   INSOMNIA, CHRONIC 01/01/2007   RESTLESS LEG SYNDROME  01/01/2007   Essential hypertension 01/01/2007    Past Medical History:  Diagnosis Date   Atypical chest pain    Back pain    chronic   Chronic insomnia    Depression    Eye abnormality    trouble seeing out of lt eye   GERD (gastroesophageal reflux disease)    Herpes zoster without complication 6/00/4599   Hyperlipidemia    Hypertension    PVD (peripheral vascular disease) ([place])    segmental femeropoliteal disease   Tobacco abuse     Family History  Problem Relation Age of Onset   Emphysema Mother        deceased age 46   Alcohol abuse Father        deceased age 6   Psoriasis Sister    Asthma Sister    Healthy Daughter    ADD / ADHD Son    Healthy Son    ADD / ADHD Son    Healthy Son    Healthy Son    Sleep apnea Neg Hx    Past Surgical History:  Procedure Laterality Date   BUNIONECTOMY  1996   bilateral   CARDIAC CATHETERIZATION N/A 11/01/2014   Procedure: Left Heart Cath and Coronary Angiography;  Surgeon: Dionisio David, MD;  Location: [facility];  Service: Cardiovascular;  Laterality: N/A;   CERVICAL DISCECTOMY  06/21/2006    and fusion    [facility]   ENDOMETRIAL ABLATION  June 2010   LOWER EXTREMITY ANGIOGRAPHY Left 08/05/2017   Procedure: LOWER EXTREMITY ANGIOGRAPHY;  Surgeon: Algernon Huxley, MD;  Location: [facility];  Service: Cardiovascular;  Laterality: Left;   NASAL SINUS SURGERY  1999   TUBAL LIGATION     WRIST SURGERY  2005   right wrist   Social History   Social History Narrative   Divorced with 4 children    Current Smoker 1ppd    No alcohol   No illicit drug use      Occupation: Tax Surveyor, quantity History  Administered Date(s) Administered   Td 06/21/2003   Tdap 06/12/2018     Objective: Vital Signs: BP (!) 166/70 (BP Location: Left Arm, Patient Position: Sitting, Cuff Size: Normal)   Pulse 67   Ht '4\' 11"'  (1.499 m)   Wt 113 lb 3.2 oz (51.3 kg)   BMI 22.86 kg/m    Physical  Exam Vitals and nursing note reviewed.  Constitutional:      Appearance: She is well-developed.  HENT:     Head: Normocephalic and atraumatic.  Eyes:     Conjunctiva/sclera: Conjunctivae normal.  Pulmonary:     Effort: Pulmonary effort is normal.  Abdominal:     Palpations: Abdomen is soft.  Musculoskeletal:     Cervical back: Normal range of motion.  Skin:    General: Skin is warm and dry.     Capillary Refill: Capillary refill takes less than 2 seconds.     Comments: Psoriasis on the extensor surface of both elbows and scattered patches on bilateral LE.  Fingernail and toenail pitting noted.   Neurological:     Mental Status: She  is alert and oriented to person, place, and time.  Psychiatric:        Behavior: Behavior normal.     Musculoskeletal Exam: C-spine is slightly limited range of motion with lateral rotation.  No midline spinal tenderness.  Tenderness over both SI joints noted.  Shoulder joints have good range of motion with no discomfort at this time.  Elbow joints have good range of motion with no tenderness or inflammation.  Tenderness over the right wrist with painful extension.  Tenderness of the right third MCP and left second and fourth PIP joints.  Hip joints have good range of motion with some discomfort on the right side.  Knee joints have good range of motion with no warmth or effusion.  Ankle joints have good range of motion with some tenderness bilaterally.  Tenderness along the Achilles tendon of the left ankle.  No tenderness along the plantar fascia.  Tenderness over the left first MTP noted.  CDAI Exam: CDAI Score: -- Patient Global: --; Provider Global: -- Swollen: 0 ; Tender: 9  Joint Exam 10/25/2020      Right  Left  Wrist   Tender     MCP 3   Tender     PIP 2      Tender  PIP 4      Tender  Sacroiliac   Tender   Tender  Ankle   Tender   Tender  MTP 1      Tender     Investigation: No additional findings.  Imaging: No results  found.  Recent Labs: Lab Results  Component Value Date   WBC 11.7 (H) 03/22/2020   HGB 13.9 03/22/2020   PLT 219 03/22/2020   NA 137 09/27/2020   K 4.3 09/27/2020   CL 103 09/27/2020   CO2 27 09/27/2020   GLUCOSE 88 09/27/2020   BUN 12 09/27/2020   CREATININE 0.90 09/27/2020   BILITOT 0.6 09/27/2020   ALKPHOS 96 09/27/2020   AST 15 09/27/2020   ALT 11 09/27/2020   PROT 6.9 09/27/2020   ALBUMIN 4.3 09/27/2020   CALCIUM 9.7 09/27/2020   GFRAA 87 03/22/2020   QFTBGOLDPLUS NEGATIVE 06/02/2019    Speciality Comments: No specialty comments available.  Procedures:  No procedures performed Allergies: Patient has no known allergies.   Assessment / Plan:     Visit Diagnoses: Psoriatic arthropathy ([place]) - History of multiple arthralgias, sacroiliitis, uveitis, psoriasis, HLA-B27 positive, family history of psoriasis: She presents today with increased pain and stiffness in multiple joints as well as a flare of psoriasis.  She has tenderness over both SI joints and is experiencing Achilles tendinitis in the left foot.  She was previously on Humira 40 mg subcutaneous injections every 14 days and methotrexate 6 tablets by mouth once weekly.  Methotrexate was added as combination therapy after her last office visit on 03/22/2020.  She discontinued Humira and methotrexate in June due to switching jobs requiring a switch in her insurance.  She was unable to return for lab work or an office visit until now.  Since being off of combination therapy she has been experiencing recurrent flares, new patches of psoriasis, and 1 uveitis flare in the left eye. CBC, CMP, and TB gold will be updated today.  A prescription for methotrexate 8 tablets by mouth once weekly will be sent to the pharmacy pending lab work.  She will return for lab work in 2 weeks after increasing the dose of methotrexate.  Since it has been longer  than 3 months since her last Humira injection she will return to the office to reinitiate  therapy pending TB Gold results. She will follow-up in the office in 3 months to reassess her response to combination therapy.  Uveitis: She established care with Dr. Manuella Ghazi on 04/15/2020.  At that time she had mild active inflammation on ocular exam.  At that time he recommended continuing on Humira and increasing the dose of methotrexate as tolerated.  She has been off of Humira and methotrexate since June 2022.  Her most recent uveitis flare in the left eye was in August requiring prednisone drops at that time. No conjunctival injection was noted on examination.  She will be restarting Humira and increasing the dose of methotrexate to 8 tablets weekly pending lab results today.   High risk medication use - Humira 40 mg sq injections every 14 days.  MTX started after last OV on 03/22/20.  She tolerated methotrexate 6 tablets weekly but discontinued in June due to requiring updated lab work as well as an office visit.  CBC, CMP, and TB gold will be updated today.  A prescription for methotrexate 8 tablets by mouth once weekly will be sent to the pharmacy pending labs.  She will return for lab work in 2 weeks after increasing the dose of methotrexate.  Since it has been longer than 3 months since her last Humira injection she will return to the office for reinitiation pending TB Gold results. - Plan: CBC with Differential/Platelet, QuantiFERON-TB Gold Plus, COMPLETE METABOLIC PANEL WITH GFR, CBC with Differential/Platelet, COMPLETE METABOLIC PANEL WITH GFR Discussed the importance of holding Humira and methotrexate if she develops signs or symptoms of an infection and to resume once the infection is completely cleared.  Discussed the importance of yearly skin examinations while on Humira due to increased risk for nonmelanoma skin cancer.  Screening for tuberculosis - Order for TB gold released today. Plan: QuantiFERON-TB Gold Plus  Sacroiliitis ([place]): She has tenderness over both SI joints on examination  today.  She will be restarting on Humira and methotrexate as discussed above.  Psoriasis: She has new patches of psoriasis on her torso, extensor surface of both elbows, and on bilateral lower extremities.  Her last dose of methotrexate and Humira were in June 2022.  I will send in a prescription for clobetasol cream which she can apply topically twice daily as needed for 2 weeks.  She will be restarting Humira and methotrexate as discussed above.  HLA B27 (HLA B27 positive)  Primary osteoarthritis of both hands: She has PIP and DIP thickening with no synovitis on examination.  Complete fist formation bilaterally.  She has been experiencing intermittent pain and inflammation in the right hand and wrist joint.  Primary osteoarthritis of both feet: PIP and DIP thickening consistent with OA of both feet.  Tenderness over the right 1st MTP joint.  Abnormal SPEP: Referred to hematology on 06/05/19.   Other medical conditions are listed as follows:   Family history of psoriasis  Cervical postlaminectomy syndrome  Chronic obstructive pulmonary disease, unspecified COPD type ([place])  History of gastroesophageal reflux (GERD)  Essential hypertension  Smoker  Atherosclerosis of abdominal aorta ([facility])  History of hyperlipidemia  PAD (peripheral artery disease) ([facility])  Adjustment disorder with mixed anxiety and depressed mood    Orders: Orders Placed This Encounter  Procedures   CBC with Differential/Platelet   QuantiFERON-TB Gold Plus   COMPLETE METABOLIC PANEL WITH GFR   CBC with Differential/Platelet   COMPLETE  METABOLIC PANEL WITH GFR   Meds ordered this encounter  Medications   clobetasol cream (TEMOVATE) 0.05 %    Sig: Apply 1 application topically 2 (two) times daily.    Dispense:  30 g    Refill:  0      Follow-Up Instructions: Return in 3 months (on 01/25/2021) for Psoriatic arthritis.   [name], PA-C  Note - This record has been created using Dragon software.   Chart creation errors have been sought, but may not always  have been located. Such creation errors do not reflect on  the standard of medical care.

## 2020-10-25 ENCOUNTER — Encounter: Payer: Self-pay | Admitting: Physician Assistant

## 2020-10-25 ENCOUNTER — Other Ambulatory Visit: Payer: Self-pay

## 2020-10-25 ENCOUNTER — Ambulatory Visit: Payer: BC Managed Care – PPO | Admitting: Physician Assistant

## 2020-10-25 ENCOUNTER — Telehealth: Payer: Self-pay

## 2020-10-25 VITALS — BP 166/70 | HR 67 | Ht 59.0 in | Wt 113.2 lb

## 2020-10-25 DIAGNOSIS — Z840 Family history of diseases of the skin and subcutaneous tissue: Secondary | ICD-10-CM

## 2020-10-25 DIAGNOSIS — L4050 Arthropathic psoriasis, unspecified: Secondary | ICD-10-CM

## 2020-10-25 DIAGNOSIS — H209 Unspecified iridocyclitis: Secondary | ICD-10-CM

## 2020-10-25 DIAGNOSIS — Z1589 Genetic susceptibility to other disease: Secondary | ICD-10-CM

## 2020-10-25 DIAGNOSIS — Z79899 Other long term (current) drug therapy: Secondary | ICD-10-CM

## 2020-10-25 DIAGNOSIS — F4323 Adjustment disorder with mixed anxiety and depressed mood: Secondary | ICD-10-CM

## 2020-10-25 DIAGNOSIS — I10 Essential (primary) hypertension: Secondary | ICD-10-CM

## 2020-10-25 DIAGNOSIS — M19041 Primary osteoarthritis, right hand: Secondary | ICD-10-CM

## 2020-10-25 DIAGNOSIS — M961 Postlaminectomy syndrome, not elsewhere classified: Secondary | ICD-10-CM

## 2020-10-25 DIAGNOSIS — F17200 Nicotine dependence, unspecified, uncomplicated: Secondary | ICD-10-CM

## 2020-10-25 DIAGNOSIS — Z8639 Personal history of other endocrine, nutritional and metabolic disease: Secondary | ICD-10-CM

## 2020-10-25 DIAGNOSIS — M461 Sacroiliitis, not elsewhere classified: Secondary | ICD-10-CM

## 2020-10-25 DIAGNOSIS — Z8719 Personal history of other diseases of the digestive system: Secondary | ICD-10-CM

## 2020-10-25 DIAGNOSIS — I700 Atherosclerosis of aorta: Secondary | ICD-10-CM

## 2020-10-25 DIAGNOSIS — M19072 Primary osteoarthritis, left ankle and foot: Secondary | ICD-10-CM

## 2020-10-25 DIAGNOSIS — M19042 Primary osteoarthritis, left hand: Secondary | ICD-10-CM

## 2020-10-25 DIAGNOSIS — I739 Peripheral vascular disease, unspecified: Secondary | ICD-10-CM

## 2020-10-25 DIAGNOSIS — M19071 Primary osteoarthritis, right ankle and foot: Secondary | ICD-10-CM

## 2020-10-25 DIAGNOSIS — J449 Chronic obstructive pulmonary disease, unspecified: Secondary | ICD-10-CM

## 2020-10-25 DIAGNOSIS — R778 Other specified abnormalities of plasma proteins: Secondary | ICD-10-CM

## 2020-10-25 DIAGNOSIS — Z111 Encounter for screening for respiratory tuberculosis: Secondary | ICD-10-CM

## 2020-10-25 DIAGNOSIS — L409 Psoriasis, unspecified: Secondary | ICD-10-CM

## 2020-10-25 MED ORDER — CLOBETASOL PROPIONATE 0.05 % EX CREA: 1.0000 "application " | TOPICAL_CREAM | Freq: Two times a day (BID) | CUTANEOUS | 0 refills | Status: AC

## 2020-10-25 NOTE — Telephone Encounter (Signed)
Please apply for humira. Patient was previously on humira and insurance has changed. Patient can restart in the office pending approval and TB gold results per Hazel Sams, PA-C. Thanks!

## 2020-10-25 NOTE — Patient Instructions (Signed)
Standing Labs We placed an order today for your standing lab work.   Please have your standing labs drawn in 2 weeks and then every 3 months   If possible, please have your labs drawn 2 weeks prior to your appointment so that the provider can discuss your results at your appointment.  Please note that you may see your imaging and lab results in [place] before we have reviewed them. We may be awaiting multiple results to interpret others before contacting you. Please allow our office up to 72 hours to thoroughly review all of the results before contacting the office for clarification of your results.  We have open lab daily: Monday through Thursday from 1:30-4:30 PM and Friday from 1:30-4:00 PM at the office of Dr. Bo Merino, [facility].   Please be advised, all patients with office appointments requiring lab work will take precedent over walk-in lab work.  If possible, please come for your lab work on Monday and Friday afternoons, as you may experience shorter wait times. The office is located at [address] [place], [place],  [address] No appointment is necessary.   Labs are drawn by [facility]. Please bring your co-pay at the time of your lab draw.  You may receive a bill from [facility] for your lab work.  If you wish to have your labs drawn at another location, please call the office 24 hours in advance to send orders.  If you have any questions regarding directions or hours of operation,  please call [phone].   As a reminder, please drink plenty of water prior to coming for your lab work. Thanks!

## 2020-10-25 NOTE — Telephone Encounter (Signed)
Pending CBC and CMP results, please send prescription for methotrexate 8 tabs once weekly and folic acid 2mg  daily per Hazel Sams, PA-C. Thanks!

## 2020-10-25 NOTE — Telephone Encounter (Signed)
Hi can you check on this for me?

## 2020-10-26 MED ORDER — FOLIC ACID 1 MG PO TABS: 2.0000 mg | ORAL_TABLET | Freq: Every day | ORAL | 3 refills | Status: DC

## 2020-10-26 MED ORDER — METHOTREXATE 2.5 MG PO TABS: 20.0000 mg | ORAL_TABLET | ORAL | 0 refills | Status: DC

## 2020-10-26 NOTE — Progress Notes (Signed)
CMP WNL. WBC count remains elevated. Absolute neutrophils and eosinophils are elevated.  Please clarify if she has had a recent infections.

## 2020-10-26 NOTE — Telephone Encounter (Signed)
Per 08/08/20 encounter:  Patient has new insurance- [employer]  Submitted a Prior Authorization request to [facility] for HUMIRA via CoverMyMeds. Will update once we receive a response.  (Key: [id number])    Received notification from [facility]  regarding a prior authorization for HUMIRA. Authorization has been APPROVED from 08/10/20 to 08/10/21.   Patient must fill through [facility]: [phone]  Authorization # Claudie Revering

## 2020-10-26 NOTE — Telephone Encounter (Signed)
CMP WNL. WBC count remains elevated. Absolute neutrophils and eosinophils are elevated.

## 2020-10-27 LAB — COMPLETE METABOLIC PANEL WITH GFR
AG Ratio: 1.7 (calc) (ref 1.0–2.5)
ALT: 9 U/L (ref 6–29)
AST: 12 U/L (ref 10–35)
Albumin: 4.1 g/dL (ref 3.6–5.1)
Alkaline phosphatase (APISO): 99 U/L (ref 37–153)
BUN: 11 mg/dL (ref 7–25)
CO2: 25 mmol/L (ref 20–32)
Calcium: 9.4 mg/dL (ref 8.6–10.4)
Chloride: 100 mmol/L (ref 98–110)
Creat: 0.83 mg/dL (ref 0.50–1.05)
Globulin: 2.4 g/dL (calc) (ref 1.9–3.7)
Glucose, Bld: 91 mg/dL (ref 65–99)
Potassium: 3.9 mmol/L (ref 3.5–5.3)
Sodium: 138 mmol/L (ref 135–146)
Total Bilirubin: 0.5 mg/dL (ref 0.2–1.2)
Total Protein: 6.5 g/dL (ref 6.1–8.1)
eGFR: 80 mL/min/{1.73_m2} (ref >=60)

## 2020-10-27 LAB — QUANTIFERON-TB GOLD PLUS
Mitogen-NIL: 9.09 IU/mL
NIL: 0.03 IU/mL
QuantiFERON-TB Gold Plus: NEGATIVE
TB1-NIL: 0.01 IU/mL
TB2-NIL: 0.01 IU/mL

## 2020-10-27 LAB — CBC WITH DIFFERENTIAL/PLATELET
Absolute Monocytes: 804 cells/uL (ref 200–950)
Basophils Absolute: 54 cells/uL (ref 0–200)
Basophils Relative: 0.4 %
Eosinophils Absolute: 978 cells/uL — ABNORMAL HIGH (ref 15–500)
Eosinophils Relative: 7.3 %
HCT: 38.4 % (ref 35.0–45.0)
Hemoglobin: 13.2 g/dL (ref 11.7–15.5)
Lymphs Abs: 3189 cells/uL (ref 850–3900)
MCH: 32 pg (ref 27.0–33.0)
MCHC: 34.4 g/dL (ref 32.0–36.0)
MCV: 93.2 fL (ref 80.0–100.0)
MPV: 10.2 fL (ref 7.5–12.5)
Monocytes Relative: 6 %
Neutro Abs: 8375 cells/uL — ABNORMAL HIGH (ref 1500–7800)
Neutrophils Relative %: 62.5 %
Platelets: 251 10*3/uL (ref 140–400)
RBC: 4.12 10*6/uL (ref 3.80–5.10)
RDW: 13 % (ref 11.0–15.0)
Total Lymphocyte: 23.8 %
WBC: 13.4 10*3/uL — ABNORMAL HIGH (ref 3.8–10.8)

## 2020-10-28 NOTE — Telephone Encounter (Signed)
Patient returned call. Humira restart visit scheduled for 11/01/20  Knox Saliva, PharmD, MPH, BCPS Clinical Pharmacist (Rheumatology and Pulmonology)

## 2020-10-28 NOTE — Progress Notes (Signed)
TB Gold is negative.

## 2020-10-28 NOTE — Telephone Encounter (Addendum)
Called [facility] for Humira copay card billing info: BIN: [id number] PCN: [id number] Group: [id number]   ID:  [id number]  ATC to schedule Humira new start but unable to reach. Left VM requesting return call and that she does not need to speak with me directly to have appt scheduled  Knox Saliva, PharmD, MPH, BCPS Clinical Pharmacist (Rheumatology and Pulmonology)

## 2020-11-01 ENCOUNTER — Ambulatory Visit: Payer: BC Managed Care – PPO | Admitting: Pharmacist

## 2020-11-01 NOTE — Telephone Encounter (Signed)
Patient called this morning and stated she was not feeling well and requested to r/s her appt. Left return VM and requested she call clinic to r/s Humira restart visit once she is back to her baseline  Knox Saliva, PharmD, MPH, BCPS Clinical Pharmacist (Rheumatology and Pulmonology)

## 2020-11-01 NOTE — Patient Instructions (Incomplete)
Your next HUMIRA dose is due on 11/15/20, 11/29/20, and every 14 days thereafter  CONTINUE METHOTREXATE 20mg  (8 tablets) once weekly AND folic acid 2 mg daily  HOLD HUMIRA AND METHOTREXATE if you have signs or symptoms of an infection. You can resume once you feel better or back to your baseline. HOLD  HUMIRA AND METHOTREXATEif you start antibiotics to treat an infection. HOLD  HUMIRA AND METHOTREXATE around the time of surgery/procedures. Your surgeon will be able to provide recommendations on when to hold BEFORE and when you are cleared to [facility].  Pharmacy information: Your prescription will be shipped from [facility]. Their phone number is [phone] Please call to schedule shipment and confirm address. They will mail your medication to your home.  Cost information: Your copay should be affordable. If you call the pharmacy and it is not affordable, please double-check that they are billing through your copay card as secondary coverage. That copay card information is: BIN: [id number] PCN: OHCP Group: [id number]   ID:  [id number]  Labs are due in 1 month then every 3 months. Lab hours are from Monday to Thursday 1:30-4:30pm and Friday 1:30-4pm. You do not need an appointment if you come for labs during these times.  How to manage an injection site reaction: Remember the 5 C's: COUNTER - leave on the counter at least 30 minutes but up to overnight to bring medication to room temperature. This may help prevent stinging COLD - place something cold (like an ice gel pack or cold water bottle) on the injection site just before cleansing with alcohol. This may help reduce pain CLARITIN - use Claritin (generic name is loratadine) for the first two weeks of treatment or the day of, the day before, and the day after injecting. This will help to minimize injection site reactions CORTISONE CREAM - apply if injection site is irritated and itching CALL ME - if injection site reaction  is bigger than the size of your fist, looks infected, blisters, or if you develop hives

## 2020-11-09 ENCOUNTER — Other Ambulatory Visit: Payer: Self-pay

## 2020-11-09 ENCOUNTER — Ambulatory Visit: Payer: BC Managed Care – PPO | Admitting: Pharmacist

## 2020-11-09 DIAGNOSIS — Z79899 Other long term (current) drug therapy: Secondary | ICD-10-CM

## 2020-11-09 DIAGNOSIS — L4050 Arthropathic psoriasis, unspecified: Secondary | ICD-10-CM

## 2020-11-09 DIAGNOSIS — H209 Unspecified iridocyclitis: Secondary | ICD-10-CM

## 2020-11-09 MED ORDER — HUMIRA (2 PEN) 40 MG/0.4ML ~~LOC~~ AJKT: AUTO-INJECTOR | SUBCUTANEOUS | 2 refills | Status: DC

## 2020-11-09 NOTE — Patient Instructions (Signed)
Your next HUMIRA dose is due on 11/23, 12/7, and every 14 days thereafter  CONTINUE methotrexate as prescribed  [place] if you have signs or symptoms of an infection. You can resume once you feel better or back to your baseline. HOLD HUMIRA AND METHOTREXATE if you start antibiotics to treat an infection. HOLD HUMIRA AND METHOTREXATE around the time of surgery/procedures. Your surgeon will be able to provide recommendations on when to hold BEFORE and when you are cleared to [facility].  Pharmacy information: Your prescription will be shipped from [facility]. Their phone number is [phone] Please call to schedule shipment and confirm address. They will mail your medication to your home.  Cost information: Your copay should be affordable. If you call the pharmacy and it is not affordable, please double-check that they are billing through your copay card as secondary coverage. That copay card information is: BIN: [id number] PCN: OHCP Group: [id number] ID:  [id number]  Labs are due in 1 month then every 3 months. Lab hours are from Monday to Thursday 1:30-4:30pm and Friday 1:30-4pm. You do not need an appointment if you come for labs during these times.  How to manage an injection site reaction: Remember the 5 C's: COUNTER - leave on the counter at least 30 minutes but up to overnight to bring medication to room temperature. This may help prevent stinging COLD - place something cold (like an ice gel pack or cold water bottle) on the injection site just before cleansing with alcohol. This may help reduce pain CLARITIN - use Claritin (generic name is loratadine) for the first two weeks of treatment or the day of, the day before, and the day after injecting. This will help to minimize injection site reactions CORTISONE CREAM - apply if injection site is irritated and itching CALL ME - if injection site reaction is bigger than the size of your fist, looks  infected, blisters, or if you develop hives

## 2020-11-09 NOTE — Progress Notes (Signed)
Pharmacy Note  Subjective:   Patient presents to clinic today to re-start Humira for psoriatic arthritis, psoriasis, uveitis. She has increased her methotrexate to 8 tablets (20mg ) weekly this past Saturday, 11/05/20.  Her last dose of Humira was in June 2022.   Patient running a fever or have signs/symptoms of infection? No  Patient currently on antibiotics for the treatment of infection? No  Patient have any upcoming invasive procedures/surgeries? No  Objective: CMP     Component Value Date/Time   NA 138 10/25/2020 1502   K 3.9 10/25/2020 1502   CL 100 10/25/2020 1502   CO2 25 10/25/2020 1502   GLUCOSE 91 10/25/2020 1502   BUN 11 10/25/2020 1502   CREATININE 0.83 10/25/2020 1502   CALCIUM 9.4 10/25/2020 1502   PROT 6.5 10/25/2020 1502   ALBUMIN 4.3 09/27/2020 1541   AST 12 10/25/2020 1502   ALT 9 10/25/2020 1502   ALKPHOS 96 09/27/2020 1541   BILITOT 0.5 10/25/2020 1502   GFRNONAA 75 03/22/2020 1540   GFRAA 87 03/22/2020 1540    CBC    Component Value Date/Time   WBC 13.4 (H) 10/25/2020 1502   RBC 4.12 10/25/2020 1502   HGB 13.2 10/25/2020 1502   HGB 11.6 05/27/2008 1610   HCT 38.4 10/25/2020 1502   HCT 34.8 05/27/2008 1610   PLT 251 10/25/2020 1502   PLT 317 05/27/2008 1610   MCV 93.2 10/25/2020 1502   MCV 84.6 05/27/2008 1610   MCH 32.0 10/25/2020 1502   MCHC 34.4 10/25/2020 1502   RDW 13.0 10/25/2020 1502   RDW 27.0 (H) 05/27/2008 1610   LYMPHSABS 3,189 10/25/2020 1502   LYMPHSABS 2.6 05/27/2008 1610   MONOABS 0.7 08/03/2019 1138   MONOABS 0.6 05/27/2008 1610   EOSABS 978 (H) 10/25/2020 1502   EOSABS 0.2 05/27/2008 1610   BASOSABS 54 10/25/2020 1502   BASOSABS 0.0 05/27/2008 1610    Baseline Immunosuppressant Therapy Labs TB GOLD Quantiferon TB Gold Latest Ref Rng & Units 10/25/2020  Quantiferon TB Gold Plus NEGATIVE NEGATIVE   Hepatitis Panel Hepatitis Latest Ref Rng & Units 06/02/2019  Hep B Surface Ag NON-REACTI NON-REACTIVE  Hep B IgM  NON-REACTI NON-REACTIVE  Hep C Ab NEGATIVE -  Hep C Ab NON-REACTI NON-REACTIVE  Hep C Ab NON-REACTI NON-REACTIVE   HIV Lab Results  Component Value Date   HIV NON-REACTIVE 06/02/2019   HIV NONREACTIVE 04/06/2016   Immunoglobulins Immunoglobulin Electrophoresis Latest Ref Rng & Units 06/02/2019  IgA  47 - 310 mg/dL 186  IgG 600 - 1,640 mg/dL 781  IgM 50 - 300 mg/dL 118   SPEP Serum Protein Electrophoresis Latest Ref Rng & Units 10/25/2020  Total Protein 6.1 - 8.1 g/dL 6.5  Albumin 3.8 - 4.8 g/dL -  Alpha-1 0.2 - 0.3 g/dL -  Alpha-2 0.5 - 0.9 g/dL -  Beta Globulin 0.4 - 0.6 g/dL -  Beta 2 0.2 - 0.5 g/dL -  Gamma Globulin 0.8 - 1.7 g/dL -   G6PD No results found for: G6PDH TPMT No results found for: TPMT   Chest x-ray: 07/26/17 - no active cardiopulmonary disease  Assessment/Plan:  Demonstrated proper injection technique with Humira demo device  Patient able to demonstrate proper injection technique using the teach back method.  Patient self injected in the right abdomen with:  Sample Medication: Humira 40mg /0.51mL NDC: [address]-19 Lot: [id number] Expiration: 12/2021  Patient tolerated well.  Observed for 30 mins in office for adverse reaction and none noted.   Patient is to  return in 1 month for labs and 6-8 weeks for follow-up appointment.  Standing orders for CBC and CMP remain in place.   Repeat CBC w diff and CMP w GFR drawn today  Humira approved through insurance.   Rx sent to: [facility]: [phone].  Patient provided with pharmacy phone number and advised to call later this week to schedule shipment to home.  She will continue Humira 40 mg SQ every 14 days in combination with methotrexate 20mg  weekly (with folic acid 2 mg daily).  All questions encouraged and answered.  Instructed patient to call with any further questions or concerns.  Knox Saliva, PharmD, MPH, BCPS Clinical Pharmacist (Rheumatology and Pulmonology)  11/09/2020 8:05 AM

## 2020-11-10 LAB — CBC WITH DIFFERENTIAL/PLATELET
Absolute Monocytes: 138 cells/uL — ABNORMAL LOW (ref 200–950)
Basophils Absolute: 42 cells/uL (ref 0–200)
Basophils Relative: 0.4 %
Eosinophils Absolute: 678 cells/uL — ABNORMAL HIGH (ref 15–500)
Eosinophils Relative: 6.4 %
HCT: 37.6 % (ref 35.0–45.0)
Hemoglobin: 12.8 g/dL (ref 11.7–15.5)
Lymphs Abs: 2311 cells/uL (ref 850–3900)
MCH: 32.1 pg (ref 27.0–33.0)
MCHC: 34 g/dL (ref 32.0–36.0)
MCV: 94.2 fL (ref 80.0–100.0)
MPV: 10.4 fL (ref 7.5–12.5)
Monocytes Relative: 1.3 %
Neutro Abs: 7431 cells/uL (ref 1500–7800)
Neutrophils Relative %: 70.1 %
Platelets: 214 10*3/uL (ref 140–400)
RBC: 3.99 10*6/uL (ref 3.80–5.10)
RDW: 13 % (ref 11.0–15.0)
Total Lymphocyte: 21.8 %
WBC: 10.6 10*3/uL (ref 3.8–10.8)

## 2020-11-10 LAB — COMPLETE METABOLIC PANEL WITH GFR
AG Ratio: 1.7 (calc) (ref 1.0–2.5)
ALT: 17 U/L (ref 6–29)
AST: 17 U/L (ref 10–35)
Albumin: 3.9 g/dL (ref 3.6–5.1)
Alkaline phosphatase (APISO): 102 U/L (ref 37–153)
BUN: 9 mg/dL (ref 7–25)
CO2: 31 mmol/L (ref 20–32)
Calcium: 9.2 mg/dL (ref 8.6–10.4)
Chloride: 103 mmol/L (ref 98–110)
Creat: 0.84 mg/dL (ref 0.50–1.05)
Globulin: 2.3 g/dL (calc) (ref 1.9–3.7)
Glucose, Bld: 93 mg/dL (ref 65–99)
Potassium: 3.8 mmol/L (ref 3.5–5.3)
Sodium: 142 mmol/L (ref 135–146)
Total Bilirubin: 0.7 mg/dL (ref 0.2–1.2)
Total Protein: 6.2 g/dL (ref 6.1–8.1)
eGFR: 79 mL/min/{1.73_m2} (ref >=60)

## 2020-11-10 NOTE — Progress Notes (Signed)
CMP WNL.  Absolute monocyte count is low.  Reviewed with Dr. Estanislado Pandy.  Please advise the patient to CBC with diff in 2 weeks.

## 2020-11-11 ENCOUNTER — Telehealth: Payer: Self-pay

## 2020-11-11 DIAGNOSIS — Z79899 Other long term (current) drug therapy: Secondary | ICD-10-CM

## 2020-11-11 NOTE — Telephone Encounter (Signed)
Patient left a voicemail stating she was returning your call.   

## 2020-11-11 NOTE — Telephone Encounter (Signed)
See lab note for details.  

## 2020-11-29 DIAGNOSIS — G4733 Obstructive sleep apnea (adult) (pediatric): Secondary | ICD-10-CM | POA: Diagnosis not present

## 2020-11-30 ENCOUNTER — Telehealth: Payer: Self-pay | Admitting: Neurology

## 2020-11-30 NOTE — Telephone Encounter (Signed)
Pt was scheduled for Initial CPAP visit on 02/23/21. Pt was informed to bring machine and power cord to appt.  DME: [facility] Phone: [phone] Fax: [phone] Equipment issued: Bennett Scrape on 11/29/20 Pt to be scheduled between 12/30/20-02/27/21

## 2020-11-30 NOTE — Telephone Encounter (Signed)
LVM pt to call back to schedule Initial CPAP

## 2020-12-29 ENCOUNTER — Other Ambulatory Visit: Payer: Self-pay

## 2020-12-29 DIAGNOSIS — Z87891 Personal history of nicotine dependence: Secondary | ICD-10-CM

## 2020-12-29 DIAGNOSIS — G4733 Obstructive sleep apnea (adult) (pediatric): Secondary | ICD-10-CM | POA: Diagnosis not present

## 2020-12-29 DIAGNOSIS — F17210 Nicotine dependence, cigarettes, uncomplicated: Secondary | ICD-10-CM

## 2021-01-13 NOTE — Progress Notes (Deleted)
Office Visit Note  Patient: [name] [name]             Date of Birth: September 13, 1960           MRN: [id number]             PCP: Pleas Koch, NP Referring: Pleas Koch, NP Visit Date: 01/26/2021 Occupation: @GUAROCC @  Subjective:  No chief complaint on file.   History of Present Illness: [name] [name] is a 61 y.o. female ***   Activities of Daily Living:  Patient reports morning stiffness for *** {minute/hour:19697}.   Patient {ACTIONS;DENIES/REPORTS:21021675::"Denies"} nocturnal pain.  Difficulty dressing/grooming: {ACTIONS;DENIES/REPORTS:21021675::"Denies"} Difficulty climbing stairs: {ACTIONS;DENIES/REPORTS:21021675::"Denies"} Difficulty getting out of chair: {ACTIONS;DENIES/REPORTS:21021675::"Denies"} Difficulty using hands for taps, buttons, cutlery, and/or writing: {ACTIONS;DENIES/REPORTS:21021675::"Denies"}  No Rheumatology ROS completed.   PMFS History:  Patient Active Problem List   Diagnosis Date Noted   High risk medication use 06/29/2019   Preventative health care 06/12/2018   Psoriasis 04/06/2016   Atherosclerosis of abdominal aorta ([place]) 07/16/2014   Adjustment disorder with mixed anxiety and depressed mood 07/16/2014   Cervical postlaminectomy syndrome 01/10/2012   Hot flashes 11/02/2011   Chronic fatigue 11/02/2011   Chronic back pain 08/17/2011   COPD (chronic obstructive pulmonary disease) ([place]) 04/21/2010   Allergic rhinitis 07/01/2009   STRESS INCONTINENCE 03/16/2008   PAD (peripheral artery disease) ([place]) 07/10/2007   LEG PAIN, BILATERAL 06/24/2007   Hyperlipidemia 01/01/2007   TOBACCO ABUSE 01/01/2007   INSOMNIA, CHRONIC 01/01/2007   RESTLESS LEG SYNDROME 01/01/2007   Essential hypertension 01/01/2007    Past Medical History:  Diagnosis Date   Atypical chest pain    Back pain    chronic   Chronic insomnia    Depression    Eye abnormality    trouble seeing out of lt eye   GERD (gastroesophageal reflux  disease)    Herpes zoster without complication 06/22/2977   Hyperlipidemia    Hypertension    PVD (peripheral vascular disease) ([place])    segmental femeropoliteal disease   Tobacco abuse     Family History  Problem Relation Age of Onset   Emphysema Mother        deceased age 84   Alcohol abuse Father        deceased age 89   Psoriasis Sister    Asthma Sister    Healthy Daughter    ADD / ADHD Son    Healthy Son    ADD / ADHD Son    Healthy Son    Healthy Son    Sleep apnea Neg Hx    Past Surgical History:  Procedure Laterality Date   BUNIONECTOMY  1996   bilateral   CARDIAC CATHETERIZATION N/A 11/01/2014   Procedure: Left Heart Cath and Coronary Angiography;  Surgeon: Dionisio David, MD;  Location: [facility];  Service: Cardiovascular;  Laterality: N/A;   CERVICAL DISCECTOMY  06/21/2006    and fusion    [place]   ENDOMETRIAL ABLATION  June 2010   LOWER EXTREMITY ANGIOGRAPHY Left 08/05/2017   Procedure: LOWER EXTREMITY ANGIOGRAPHY;  Surgeon: Algernon Huxley, MD;  Location: [facility];  Service: Cardiovascular;  Laterality: Left;   NASAL SINUS SURGERY  1999   TUBAL LIGATION     WRIST SURGERY  2005   right wrist   Social History   Social History Narrative   Divorced with 4 children    Current Smoker 1ppd    No alcohol  No illicit drug use      Occupation: Tax Surveyor, quantity History  Administered Date(s) Administered   Td 06/21/2003   Tdap 06/12/2018     Objective: Vital Signs: There were no vitals taken for this visit.   Physical Exam   Musculoskeletal Exam: ***  CDAI Exam: CDAI Score: -- Patient Global: --; Provider Global: -- Swollen: --; Tender: -- Joint Exam 01/26/2021   No joint exam has been documented for this visit   There is currently no information documented on the homunculus. Go to the Rheumatology activity and complete the homunculus joint exam.  Investigation: No  additional findings.  Imaging: No results found.  Recent Labs: Lab Results  Component Value Date   WBC 10.6 11/09/2020   HGB 12.8 11/09/2020   PLT 214 11/09/2020   NA 142 11/09/2020   K 3.8 11/09/2020   CL 103 11/09/2020   CO2 31 11/09/2020   GLUCOSE 93 11/09/2020   BUN 9 11/09/2020   CREATININE 0.84 11/09/2020   BILITOT 0.7 11/09/2020   ALKPHOS 96 09/27/2020   AST 17 11/09/2020   ALT 17 11/09/2020   PROT 6.2 11/09/2020   ALBUMIN 4.3 09/27/2020   CALCIUM 9.2 11/09/2020   GFRAA 87 03/22/2020   QFTBGOLDPLUS NEGATIVE 10/25/2020    Speciality Comments: No specialty comments available.  Procedures:  No procedures performed Allergies: Patient has no known allergies.   Assessment / Plan:     Visit Diagnoses: No diagnosis found.  Orders: No orders of the defined types were placed in this encounter.  No orders of the defined types were placed in this encounter.   Face-to-face time spent with patient was *** minutes. Greater than 50% of time was spent in counseling and coordination of care.  Follow-Up Instructions: No follow-ups on file.   Earnestine Mealing, CMA  Note - This record has been created using Editor, commissioning.  Chart creation errors have been sought, but may not always  have been located. Such creation errors do not reflect on  the standard of medical care.

## 2021-01-15 ENCOUNTER — Other Ambulatory Visit: Payer: Self-pay | Admitting: Primary Care

## 2021-01-15 DIAGNOSIS — G4700 Insomnia, unspecified: Secondary | ICD-10-CM

## 2021-01-23 ENCOUNTER — Other Ambulatory Visit: Payer: Self-pay

## 2021-01-23 ENCOUNTER — Encounter: Payer: Self-pay | Admitting: Acute Care

## 2021-01-23 ENCOUNTER — Telehealth: Payer: BC Managed Care – PPO | Admitting: Acute Care

## 2021-01-23 DIAGNOSIS — F17210 Nicotine dependence, cigarettes, uncomplicated: Secondary | ICD-10-CM | POA: Diagnosis not present

## 2021-01-23 NOTE — Patient Instructions (Signed)
Thank you for participating in the [facility] Screening [facility]. °It was our pleasure to meet you today. °We will call you with the results of your scan within the next few days. °Your scan will be assigned a Lung RADS category score by the physicians reading the scans.  °This Lung RADS score determines follow up scanning.  °See below for description of categories, and follow up screening recommendations. °We will be in touch to schedule your follow up screening annually or based on recommendations of our providers. °We will fax a copy of your scan results to your Primary Care Physician, or the physician who referred you to the program, to ensure they have the results. °Please call the office if you have any questions or concerns regarding your scanning experience or results.  °Our office number is [phone]. °Please speak with Denise Phelps, RN. She is our Lung Cancer Screening RN. °If she is unavailable when you call, please have the office staff send her a message. She will return your call at her earliest convenience. °Remember, if your scan is normal, we will scan you annually as long as you continue to meet the criteria for the program. (Age 55-77, Current smoker or smoker who has quit within the last 15 years). °If you are a smoker, remember, quitting is the single most powerful action that you can take to decrease your risk of lung cancer and other pulmonary, breathing related problems. °We know quitting is hard, and we are here to help.  °Please let us know if there is anything we can do to help you meet your goal of quitting. °If you are a former smoker, congratulations. We are proud of you! Remain smoke free! °Remember you can refer friends or family members through the number above.  °We will screen them to make sure they meet criteria for the program. °Thank you for helping us take better care of you by participating in Lung Screening. ° °You can receive free nicotine replacement therapy  ( patches, gum or mints) by calling 1-800-QUIT NOW. Please call so we can get you on the path to becoming  a non-smoker. I know it is hard, but you can do this! ° °Lung RADS Categories: ° °Lung RADS 1: no nodules or definitely non-concerning nodules.  °Recommendation is for a repeat annual scan in 12 months. ° °Lung RADS 2:  nodules that are non-concerning in appearance and behavior with a very low likelihood of becoming an active cancer. °Recommendation is for a repeat annual scan in 12 months. ° °Lung RADS 3: nodules that are probably non-concerning , includes nodules with a low likelihood of becoming an active cancer.  Recommendation is for a 6-month repeat screening scan. Often noted after an upper respiratory illness. We will be in touch to make sure you have no questions, and to schedule your 6-month scan. ° °Lung RADS 4 A: nodules with concerning findings, recommendation is most often for a follow up scan in 3 months or additional testing based on our provider's assessment of the scan. We will be in touch to make sure you have no questions and to schedule the recommended 3 month follow up scan. ° °Lung RADS 4 B:  indicates findings that are concerning. We will be in touch with you to schedule additional diagnostic testing based on our provider's  assessment of the scan. ° °Hypnosis for smoking cessation  °[employer] °[phone] ° °Acupuncture for smoking cessation  °[facility] °[phone]  °

## 2021-01-23 NOTE — Progress Notes (Addendum)
Virtual Visit via Video Note  I connected with Estephania A Bains on 11/15/20 at  2:00 PM EST by telephone and verified that I am speaking with the correct person using two identifiers.  Location: Patient: Home Provider: Working form home   I discussed the limitations, risks, security and privacy concerns of performing an evaluation and management service by telephone and the availability of in person appointments. I also discussed with the patient that there may be a patient responsible charge related to this service. The patient expressed understanding and agreed to proceed.  Shared Decision Making Visit Lung Cancer Screening Program [phone])   Eligibility: Age 61 y.o. Pack Years Smoking History Calculation 45 (# packs/per year x # years smoked) Recent History of coughing up blood  no Unexplained weight loss? no ( >Than 15 pounds within the last 6 months ) Prior History Lung / other cancer no (Diagnosis within the last 5 years already requiring surveillance chest CT Scans). Smoking Status Current Smoker Former Smokers: Years since quit: NA  Quit Date: NA  Visit Components: Discussion included one or more decision making aids. yes Discussion included risk/benefits of screening. yes Discussion included potential follow up diagnostic testing for abnormal scans. yes Discussion included meaning and risk of over diagnosis. yes Discussion included meaning and risk of False Positives. yes Discussion included meaning of total radiation exposure. yes  Counseling Included: Importance of adherence to annual lung cancer LDCT screening. yes Impact of comorbidities on ability to participate in the program. yes Ability and willingness to under diagnostic treatment. yes  Smoking Cessation Counseling: Current Smokers:  Discussed importance of smoking cessation. yes Information about tobacco cessation classes and interventions provided to patient. yes Patient provided with "ticket" for LDCT  Scan. yes Symptomatic Patient. yes  Counseling(Intermediate counseling: > three minutes) [id number] Diagnosis Code: Tobacco Use Z72.0 Asymptomatic Patient no  Counseling NA Former Smokers:  Discussed the importance of maintaining cigarette abstinence. yes Diagnosis Code: Personal History of Nicotine Dependence. H37.169 Information about tobacco cessation classes and interventions provided to patient. Yes Patient provided with "ticket" for LDCT Scan. yes Written Order for Lung Cancer Screening with LDCT placed in Epic. Yes (CT Chest Lung Cancer Screening Low Dose W/O CM) [id number] Z12.2-Screening of respiratory organs Z87.891-Personal history of nicotine dependence   I spent 25 minutes of face to face time with her discussing the risks and benefits of lung cancer screening. We viewed a power point together that explained in detail the above noted topics. We took the time to pause the power point at intervals to allow for questions to be asked and answered to ensure understanding. We discussed that she had taken the single most powerful action possible to decrease her risk of developing lung cancer when he quit smoking. I counseled her to remain smoke free, and to contact me if she ever had the desire to smoke again so that I can provide resources and tools to help support the effort to remain smoke free. We discussed the time and location of the scan, and that either  Doroteo Glassman RN or I will call with the results within  24-48 hours of receiving them. She has my card and contact information in the event She needs to speak with me, in addition to a copy of the power point we reviewed as a resource. She verbalized understanding of all of the above and had no further questions upon leaving the office.     I explained to the patient that there has been a  high incidence of coronary artery disease noted on these exams. I explained that this is a non-gated exam therefore degree or severity cannot be  determined. This patient is on statin therapy. I have asked the patient to follow-up with their PCP regarding any incidental finding of coronary artery disease and management with diet or medication as they feel is clinically indicated. The patient verbalized understanding of the above and had no further questions.   I spent 3 minutes counseling on smoking cessation and the health risks of continued tobacco abuse.  Zacharee Gaddie D. Kenton Kingfisher, NP-C [facility] Personal contact information can be found on Amion  01/23/2021, 11:06 AM

## 2021-01-25 ENCOUNTER — Ambulatory Visit
Admission: RE | Admit: 2021-01-25 | Discharge: 2021-01-25 | Disposition: A | Discharge status: Home / Self Care | Payer: BC Managed Care – PPO | Source: Ambulatory Visit | Attending: Acute Care | Admitting: Acute Care

## 2021-01-25 DIAGNOSIS — F17210 Nicotine dependence, cigarettes, uncomplicated: Secondary | ICD-10-CM

## 2021-01-25 DIAGNOSIS — Z87891 Personal history of nicotine dependence: Secondary | ICD-10-CM

## 2021-01-26 ENCOUNTER — Ambulatory Visit: Payer: BC Managed Care – PPO | Admitting: Rheumatology

## 2021-01-26 DIAGNOSIS — M461 Sacroiliitis, not elsewhere classified: Secondary | ICD-10-CM

## 2021-01-26 DIAGNOSIS — Z8639 Personal history of other endocrine, nutritional and metabolic disease: Secondary | ICD-10-CM

## 2021-01-26 DIAGNOSIS — J449 Chronic obstructive pulmonary disease, unspecified: Secondary | ICD-10-CM

## 2021-01-26 DIAGNOSIS — Z8719 Personal history of other diseases of the digestive system: Secondary | ICD-10-CM

## 2021-01-26 DIAGNOSIS — M961 Postlaminectomy syndrome, not elsewhere classified: Secondary | ICD-10-CM

## 2021-01-26 DIAGNOSIS — Z840 Family history of diseases of the skin and subcutaneous tissue: Secondary | ICD-10-CM

## 2021-01-26 DIAGNOSIS — I10 Essential (primary) hypertension: Secondary | ICD-10-CM

## 2021-01-26 DIAGNOSIS — F17200 Nicotine dependence, unspecified, uncomplicated: Secondary | ICD-10-CM

## 2021-01-26 DIAGNOSIS — F4323 Adjustment disorder with mixed anxiety and depressed mood: Secondary | ICD-10-CM

## 2021-01-26 DIAGNOSIS — L4050 Arthropathic psoriasis, unspecified: Secondary | ICD-10-CM

## 2021-01-26 DIAGNOSIS — M19071 Primary osteoarthritis, right ankle and foot: Secondary | ICD-10-CM

## 2021-01-26 DIAGNOSIS — I739 Peripheral vascular disease, unspecified: Secondary | ICD-10-CM

## 2021-01-26 DIAGNOSIS — M19041 Primary osteoarthritis, right hand: Secondary | ICD-10-CM

## 2021-01-26 DIAGNOSIS — Z79899 Other long term (current) drug therapy: Secondary | ICD-10-CM

## 2021-01-26 DIAGNOSIS — I700 Atherosclerosis of aorta: Secondary | ICD-10-CM

## 2021-01-26 DIAGNOSIS — L409 Psoriasis, unspecified: Secondary | ICD-10-CM

## 2021-01-26 DIAGNOSIS — Z1589 Genetic susceptibility to other disease: Secondary | ICD-10-CM

## 2021-01-26 DIAGNOSIS — H209 Unspecified iridocyclitis: Secondary | ICD-10-CM

## 2021-01-26 DIAGNOSIS — R778 Other specified abnormalities of plasma proteins: Secondary | ICD-10-CM

## 2021-01-27 ENCOUNTER — Other Ambulatory Visit: Payer: Self-pay

## 2021-01-27 DIAGNOSIS — F17210 Nicotine dependence, cigarettes, uncomplicated: Secondary | ICD-10-CM

## 2021-01-27 DIAGNOSIS — Z87891 Personal history of nicotine dependence: Secondary | ICD-10-CM

## 2021-01-29 DIAGNOSIS — G4733 Obstructive sleep apnea (adult) (pediatric): Secondary | ICD-10-CM | POA: Diagnosis not present

## 2021-02-23 ENCOUNTER — Ambulatory Visit: Payer: BC Managed Care – PPO | Admitting: Adult Health

## 2021-02-25 ENCOUNTER — Other Ambulatory Visit: Payer: Self-pay | Admitting: Physician Assistant

## 2021-02-25 DIAGNOSIS — L4050 Arthropathic psoriasis, unspecified: Secondary | ICD-10-CM

## 2021-02-25 DIAGNOSIS — H209 Unspecified iridocyclitis: Secondary | ICD-10-CM

## 2021-02-28 DIAGNOSIS — G4733 Obstructive sleep apnea (adult) (pediatric): Secondary | ICD-10-CM | POA: Diagnosis not present

## 2021-03-07 ENCOUNTER — Ambulatory Visit: Payer: BC Managed Care – PPO | Admitting: Primary Care

## 2021-03-07 ENCOUNTER — Other Ambulatory Visit: Payer: Self-pay

## 2021-03-07 VITALS — BP 150/82 | HR 74 | Temp 98.6°F | Wt 114.0 lb

## 2021-03-07 DIAGNOSIS — H938X3 Other specified disorders of ear, bilateral: Secondary | ICD-10-CM | POA: Insufficient documentation

## 2021-03-07 DIAGNOSIS — I10 Essential (primary) hypertension: Secondary | ICD-10-CM

## 2021-03-07 DIAGNOSIS — H6123 Impacted cerumen, bilateral: Secondary | ICD-10-CM | POA: Diagnosis not present

## 2021-03-07 MED ORDER — HYDROCHLOROTHIAZIDE 25 MG PO TABS: 25.0000 mg | ORAL_TABLET | Freq: Every day | ORAL | 1 refills | Status: DC

## 2021-03-07 NOTE — Assessment & Plan Note (Signed)
Uncontrolled. ? ?Continue amlodipine 10 mg. ?Increase hydrochlorothiazide to 25 mg. ?Continue metoprolol tartrate 25 mg twice daily. ? ?We will plan to see her back in 2 to 3 weeks for blood pressure check. ?

## 2021-03-07 NOTE — Assessment & Plan Note (Signed)
Cerumen impaction noted bilaterally. ? ?Patient consented to irrigation. ?Bilateral canals irrigated. ?Patient tolerated well. ?Canals and TMs post irrigation unremarkable. ? ?Also suspect uncontrolled hypertension to be contributing to some of her symptoms. ? ?We will see her back in a couple of weeks for follow-up after treatment of her blood pressure today. ?

## 2021-03-07 NOTE — Progress Notes (Signed)
? ?Subjective:  ? ? Patient ID: [name] [name], female    DOB: [birth year], 61 y.o.   MRN: [id number] ? ?HPI ? ?[name] [name] is a very pleasant 61 y.o. female with a history of hypertension, PAD, psoriasis, chronic back pain, chronic fatigue, COPD, anxiety and depression who presents today to discuss hypertension and ear pressure. ? ?1) Essential Hypertension: Currently managed on amlodipine 10 mg daily, metoprolol tartrate 25 mg BID, HCTZ 12.5 mg daily. She was last evaluated in late September 2022 for CPE, HCTZ 12.5 mg was added at the time. She was asked to come back 2-3 weeks later for BP check, she returns today. ? ?Since her lat visit she is compliant to her prescribed regimen. She has noticed headaches and noticing a "heartbeat" in her ear and eyes.  ? ?She checks her BP on occasion at home and is getting readings of 150's/80's.  ? ?BP Readings from Last 3 Encounters:  ?03/07/21 (!) 150/82  ?11/09/20 138/62  ?10/25/20 (!) 166/70  ? ?2) Ear Fullness: Chronic for years, worse to the right side. Over the last 6-7 months she's noticed increased fullness, can feel her heartbeat. She denies ringing in the ears. ? ?She was evaluated by ENT 10 years ago, was told that she had tinnitus. She has no ringing in the ears.  ? ?Review of Systems  ?HENT:    ?     Ear fullness   ?Cardiovascular:  Negative for chest pain.  ?Neurological:  Positive for headaches. Negative for dizziness.  ? ?   ? ? ?Past Medical History:  ?Diagnosis Date  ? Atypical chest pain   ? Back pain   ? chronic  ? Chronic insomnia   ? Depression   ? Eye abnormality   ? trouble seeing out of lt eye  ? GERD (gastroesophageal reflux disease)   ? Herpes zoster without complication 0/62/6948  ? Hyperlipidemia   ? Hypertension   ? PVD (peripheral vascular disease) ([place])   ? segmental femeropoliteal disease  ? Tobacco abuse   ? ? ?Social History  ? ?Socioeconomic History  ? Marital status: Divorced  ?  Spouse name: Not on file  ? Number of children: Not on file   ? Years of education: Not on file  ? Highest education level: Not on file  ?Occupational History  ? Not on file  ?Tobacco Use  ? Smoking status: Every Day  ?  Packs/day: 1.00  ?  Years: 45.00  ?  Pack years: 45.00  ?  Types: Cigarettes  ? Smokeless tobacco: Never  ?Vaping Use  ? Vaping Use: Never used  ?Substance and Sexual Activity  ? Alcohol use: No  ? Drug use: Never  ? Sexual activity: Not on file  ?Other Topics Concern  ? Not on file  ?Social History Narrative  ? Divorced with 4 children   ? Current Smoker 1ppd   ? No alcohol  ? No illicit drug use     ? Occupation: Tax preparation  ?    ? ?Social Determinants of Health  ? ?Financial Resource Strain: Not on file  ?Food Insecurity: Not on file  ?Transportation Needs: Not on file  ?Physical Activity: Not on file  ?Stress: Not on file  ?Social Connections: Not on file  ?Intimate Partner Violence: Not on file  ? ? ?Past Surgical History:  ?Procedure Laterality Date  ? BUNIONECTOMY  1996  ? bilateral  ? CARDIAC CATHETERIZATION N/A 11/01/2014  ? Procedure: Left Heart Cath and  Coronary Angiography;  Surgeon: Dionisio David, MD;  Location: [facility];  Service: Cardiovascular;  Laterality: N/A;  ? CERVICAL DISCECTOMY  06/21/2006  ?  and fusion   ? [place]  ? ENDOMETRIAL ABLATION  June 2010  ? LOWER EXTREMITY ANGIOGRAPHY Left 08/05/2017  ? Procedure: LOWER EXTREMITY ANGIOGRAPHY;  Surgeon: Algernon Huxley, MD;  Location: [facility];  Service: Cardiovascular;  Laterality: Left;  ? NASAL SINUS SURGERY  1999  ? TUBAL LIGATION    ? WRIST SURGERY  2005  ? right wrist  ? ? ?Family History  ?Problem Relation Age of Onset  ? Emphysema Mother   ?     deceased age 50  ? Alcohol abuse Father   ?     deceased age 45  ? Psoriasis Sister   ? Asthma Sister   ? Healthy Daughter   ? ADD / ADHD Son   ? Healthy Son   ? ADD / ADHD Son   ? Healthy Son   ? Healthy Son   ? Sleep apnea Neg Hx   ? ? ?No Known Allergies ? ?Current Outpatient Medications on File Prior  to Visit  ?Medication Sig Dispense Refill  ? Adalimumab (HUMIRA PEN) 40 MG/0.4ML PNKT INJECT 40 MG UNDER THE SKIN EVERY 14 DAYS 2 each 2  ? albuterol (VENTOLIN HFA) 108 (90 Base) MCG/ACT inhaler Inhale 2 puffs into the lungs every 6 (six) hours as needed for wheezing or shortness of breath. 1 each 0  ? amLODipine (NORVASC) 10 MG tablet Take 1 tablet (10 mg total) by mouth daily. For blood pressure. 90 tablet 3  ? atorvastatin (LIPITOR) 10 MG tablet Take 1 tablet (10 mg total) by mouth daily. For cholesterol. 90 tablet 3  ? clobetasol cream (TEMOVATE) 0.10 % Apply 1 application topically 2 (two) times daily. 30 g 0  ? clopidogrel (PLAVIX) 75 MG tablet Take 1 tablet (75 mg total) by mouth daily. 90 tablet 3  ? fluticasone (FLONASE) 50 MCG/ACT nasal spray Place 1 spray into both nostrils 2 (two) times daily. 16 g 3  ? hydrochlorothiazide (HYDRODIURIL) 12.5 MG tablet TAKE 1 TABLET(12.5 MG) BY MOUTH DAILY FOR BLOOD PRESSURE 90 tablet 3  ? levocetirizine (XYZAL) 5 MG tablet Take 1 tablet (5 mg total) by mouth every evening. For allergies. 90 tablet 1  ? metoprolol tartrate (LOPRESSOR) 25 MG tablet Take 1 tablet (25 mg total) by mouth 2 (two) times daily. For blood pressure. 180 tablet 3  ? naproxen sodium (ALEVE) 220 MG tablet Take 220 mg by mouth 2 (two) times daily as needed.    ? PARoxetine (PAXIL) 40 MG tablet Take 1 tablet (40 mg total) by mouth daily. For anxiety/depression. 90 tablet 3  ? zolpidem (AMBIEN) 10 MG tablet TAKE 1 TABLET BY MOUTH AT BEDTIME AS NEEDED FOR SLEEP 90 tablet 0  ? ?No current facility-administered medications on file prior to visit.  ? ? ?BP (!) 150/82   Pulse 74   Temp 98.6 ?F (37 ?C)   Wt 114 lb (51.7 kg)   SpO2 96%   BMI 23.03 kg/m?  ?Objective:  ? Physical Exam ?Cardiovascular:  ?   Rate and Rhythm: Normal rate and regular rhythm.  ?Pulmonary:  ?   Effort: Pulmonary effort is normal.  ?   Breath sounds: Normal breath sounds.  ?Musculoskeletal:  ?   Cervical back: Neck supple.   ?Skin: ?   General: Skin is warm and dry.  ? ? ? ? ? ?   ?  Assessment & Plan:  ? ? ? ? ?This visit occurred during the SARS-CoV-2 public health emergency.  Safety protocols were in place, including screening questions prior to the visit, additional usage of staff PPE, and extensive cleaning of exam room while observing appropriate contact time as indicated for disinfecting solutions.  ?

## 2021-03-07 NOTE — Patient Instructions (Signed)
We increase the dose of your hydrochlorothiazide to 25 mg.  I sent a new prescription to your pharmacy. ? ?Continue amlodipine 10 mg daily, metoprolol tartrate 25 mg twice daily. ? ?Schedule a follow up visit for 2-3 weeks for blood pressure check. ? ?It was a pleasure to see you today! ? ? ?

## 2021-03-24 ENCOUNTER — Other Ambulatory Visit: Payer: BC Managed Care – PPO

## 2021-03-24 ENCOUNTER — Ambulatory Visit: Payer: BC Managed Care – PPO | Admitting: Primary Care

## 2021-03-24 ENCOUNTER — Other Ambulatory Visit: Payer: Self-pay

## 2021-03-24 ENCOUNTER — Other Ambulatory Visit: Payer: Self-pay | Admitting: Primary Care

## 2021-03-24 ENCOUNTER — Encounter: Payer: Self-pay | Admitting: Primary Care

## 2021-03-24 VITALS — BP 142/64 | HR 69 | Ht 59.0 in | Wt 117.2 lb

## 2021-03-24 DIAGNOSIS — I10 Essential (primary) hypertension: Secondary | ICD-10-CM | POA: Diagnosis not present

## 2021-03-24 DIAGNOSIS — F17200 Nicotine dependence, unspecified, uncomplicated: Secondary | ICD-10-CM

## 2021-03-24 MED ORDER — CHANTIX STARTING MONTH PAK 0.5 MG X 11 & 1 MG X 42 PO TBPK: 1.0000 | ORAL_TABLET | Freq: Every day | ORAL | 0 refills | Status: DC

## 2021-03-24 MED ORDER — OLMESARTAN MEDOXOMIL 20 MG PO TABS: 20.0000 mg | ORAL_TABLET | Freq: Every day | ORAL | 0 refills | Status: DC

## 2021-03-24 NOTE — Assessment & Plan Note (Signed)
Ready to quit. ? ?Will trial Chantix starter pack again.  Discussed that she needs to choose a quit date prior to starting the pack.  We also discussed that her quit date to be within a week as she will experience symptoms of nausea with the increased dose of the medication. ? ?Discussed potential side effects. ? ?Follow-up in a few weeks. ?

## 2021-03-24 NOTE — Progress Notes (Signed)
? ?Subjective:  ? ? Patient ID: [name] [name], female    DOB: 1960-03-27, 61 y.o.   MRN: [id number] ? ?HPI ? ?[name] [name] is a very pleasant 61 y.o. female with a history of hypertension, PAD, COPD, abdominal aortic atherosclerosis, tobacco abuse, chronic fatigue who presents today for follow-up of hypertension and to discuss smoking cessation. ? ?1) Hypertension: She was last evaluated on 03/07/2021 for elevated blood pressure readings despite compliance to HCTZ 12.5 mg daily, loaded pain 10 mg daily.  She also endorsed symptoms of headaches and a "heartbeat" sensation behind her eyes.  Given her elevated readings, coupled with her symptoms, we increased her HCTZ to 25 mg and continued her amlodipine 10 mg daily.  She is here for follow-up today. ? ?Since her last visit she's checking her BP at home which is running 140's-150's/60's. She continues to experience headaches and the throbbing sensation behind her right eye and inside right ear.  ? ?She denies dizziness, photophobia, nausea, history of migraines. She can see the heartbeat in her eyes, like a flash that occurs with the heartbeat rate.  She has been evaluated by her eye doctor, last visit was 6 months ago, the symptoms were not mentioned during her visit as they were minimal.  ? ?BP Readings from Last 3 Encounters:  ?03/24/21 (!) 142/64  ?03/07/21 (!) 150/82  ?11/09/20 138/62  ? ?2) Tobacco Abuse: Chronic smoker for 56 years, smoked 1 PPD, cut back to 1/2 PPD in December 2022. She is ready to quit. She's tried the nicotine patches and gum without success. She still had strong cravings. ? ?She's tried Chantix in 2007, but couldn't continue taking due to side effects of nausea. She's tried Wellbutrin years ago, isn't sure how she did.  ? ? ?Review of Systems  ?Eyes:  Positive for visual disturbance.  ?Respiratory:  Negative for shortness of breath.   ?Cardiovascular:  Negative for chest pain.  ?Gastrointestinal:  Negative for nausea.  ?Neurological:   Positive for headaches. Negative for dizziness.  ? ?   ? ? ?Past Medical History:  ?Diagnosis Date  ? Atypical chest pain   ? Back pain   ? chronic  ? Chronic insomnia   ? Depression   ? Eye abnormality   ? trouble seeing out of lt eye  ? GERD (gastroesophageal reflux disease)   ? Herpes zoster without complication 7/61/6073  ? Hyperlipidemia   ? Hypertension   ? PVD (peripheral vascular disease) ([place])   ? segmental femeropoliteal disease  ? Tobacco abuse   ? ? ?Social History  ? ?Socioeconomic History  ? Marital status: Divorced  ?  Spouse name: Not on file  ? Number of children: Not on file  ? Years of education: Not on file  ? Highest education level: Not on file  ?Occupational History  ? Not on file  ?Tobacco Use  ? Smoking status: Every Day  ?  Packs/day: 1.00  ?  Years: 45.00  ?  Pack years: 45.00  ?  Types: Cigarettes  ? Smokeless tobacco: Never  ?Vaping Use  ? Vaping Use: Never used  ?Substance and Sexual Activity  ? Alcohol use: No  ? Drug use: Never  ? Sexual activity: Not on file  ?Other Topics Concern  ? Not on file  ?Social History Narrative  ? Divorced with 4 children   ? Current Smoker 1ppd   ? No alcohol  ? No illicit drug use     ? Occupation: Tax preparation  ?    ? ?  Social Determinants of Health  ? ?Financial Resource Strain: Not on file  ?Food Insecurity: Not on file  ?Transportation Needs: Not on file  ?Physical Activity: Not on file  ?Stress: Not on file  ?Social Connections: Not on file  ?Intimate Partner Violence: Not on file  ? ? ?Past Surgical History:  ?Procedure Laterality Date  ? BUNIONECTOMY  1996  ? bilateral  ? CARDIAC CATHETERIZATION N/A 11/01/2014  ? Procedure: Left Heart Cath and Coronary Angiography;  Surgeon: Dionisio David, MD;  Location: [facility];  Service: Cardiovascular;  Laterality: N/A;  ? CERVICAL DISCECTOMY  06/21/2006  ?  and fusion   ? [place]  ? ENDOMETRIAL ABLATION  June 2010  ? LOWER EXTREMITY ANGIOGRAPHY Left 08/05/2017  ? Procedure: LOWER  EXTREMITY ANGIOGRAPHY;  Surgeon: Algernon Huxley, MD;  Location: [facility];  Service: Cardiovascular;  Laterality: Left;  ? NASAL SINUS SURGERY  1999  ? TUBAL LIGATION    ? WRIST SURGERY  2005  ? right wrist  ? ? ?Family History  ?Problem Relation Age of Onset  ? Emphysema Mother   ?     deceased age 74  ? Alcohol abuse Father   ?     deceased age 72  ? Psoriasis Sister   ? Asthma Sister   ? Healthy Daughter   ? ADD / ADHD Son   ? Healthy Son   ? ADD / ADHD Son   ? Healthy Son   ? Healthy Son   ? Sleep apnea Neg Hx   ? ? ?No Known Allergies ? ?Current Outpatient Medications on File Prior to Visit  ?Medication Sig Dispense Refill  ? Adalimumab (HUMIRA PEN) 40 MG/0.4ML PNKT INJECT 40 MG UNDER THE SKIN EVERY 14 DAYS 2 each 2  ? albuterol (VENTOLIN HFA) 108 (90 Base) MCG/ACT inhaler Inhale 2 puffs into the lungs every 6 (six) hours as needed for wheezing or shortness of breath. 1 each 0  ? amLODipine (NORVASC) 10 MG tablet Take 1 tablet (10 mg total) by mouth daily. For blood pressure. 90 tablet 3  ? atorvastatin (LIPITOR) 10 MG tablet Take 1 tablet (10 mg total) by mouth daily. For cholesterol. 90 tablet 3  ? clobetasol cream (TEMOVATE) 8.78 % Apply 1 application topically 2 (two) times daily. 30 g 0  ? clopidogrel (PLAVIX) 75 MG tablet Take 1 tablet (75 mg total) by mouth daily. 90 tablet 3  ? fluticasone (FLONASE) 50 MCG/ACT nasal spray Place 1 spray into both nostrils 2 (two) times daily. 16 g 3  ? hydrochlorothiazide (HYDRODIURIL) 25 MG tablet Take 1 tablet (25 mg total) by mouth daily. For blood pressure. 90 tablet 1  ? levocetirizine (XYZAL) 5 MG tablet Take 1 tablet (5 mg total) by mouth every evening. For allergies. 90 tablet 1  ? metoprolol tartrate (LOPRESSOR) 25 MG tablet Take 1 tablet (25 mg total) by mouth 2 (two) times daily. For blood pressure. 180 tablet 3  ? naproxen sodium (ALEVE) 220 MG tablet Take 220 mg by mouth 2 (two) times daily as needed.    ? PARoxetine (PAXIL) 40 MG tablet Take 1  tablet (40 mg total) by mouth daily. For anxiety/depression. 90 tablet 3  ? zolpidem (AMBIEN) 10 MG tablet TAKE 1 TABLET BY MOUTH AT BEDTIME AS NEEDED FOR SLEEP 90 tablet 0  ? ?No current facility-administered medications on file prior to visit.  ? ? ?BP (!) 142/64   Pulse 69   Ht  $'4\' 11"'P$  (1.499 m)   Wt 117 lb 3.2 oz (53.2 kg)   SpO2 97%   BMI 23.67 kg/m?  ?Objective:  ? Physical Exam ?Cardiovascular:  ?   Rate and Rhythm: Normal rate and regular rhythm.  ?Pulmonary:  ?   Effort: Pulmonary effort is normal.  ?   Breath sounds: Normal breath sounds.  ?Musculoskeletal:  ?   Cervical back: Neck supple.  ?Skin: ?   General: Skin is warm and dry.  ? ? ? ? ? ?   ?Assessment & Plan:  ? ? ? ? ?This visit occurred during the SARS-CoV-2 public health emergency.  Safety protocols were in place, including screening questions prior to the visit, additional usage of staff PPE, and extensive cleaning of exam room while observing appropriate contact time as indicated for disinfecting solutions.  ?

## 2021-03-24 NOTE — Assessment & Plan Note (Signed)
Improved but remains uncontrolled. ?Need to add an additional medication. ? ?I tried to search for different medications so that we could combine some of her pills. ? ?Start olmesartan 20 mg daily. ?Continue amlodipine 10 mg daily, HCTZ 25 mg daily, metoprolol tartrate 25 mg twice daily (initiated for CAD years ago). ? ?If this controls blood pressure, then consider combining olmesartan and amlodipine into 1 pill. ? ?We will plan to see her back in 2 to 3 weeks for blood pressure check and BMP. ?

## 2021-03-24 NOTE — Patient Instructions (Signed)
Start olmesartan 20 mg daily for blood pressure. ? ?Continue hydrochlorothiazide 25 mg daily, amlodipine 10 mg daily. ? ?Continue to monitor your blood pressures. ? ?Before you start Chantix you must choose a smoking quit date. The quit date needs to be within the first two weeks of starting the pill pack.  ? ?Start with the Starting Month Pack, I sent this to your pharmacy.  ? ?Please schedule a follow up visit to meet back with me in 2-3 weeks for blood pressure check.  ? ?It was a pleasure to see you today! ? ? ?

## 2021-03-29 DIAGNOSIS — G4733 Obstructive sleep apnea (adult) (pediatric): Secondary | ICD-10-CM | POA: Diagnosis not present

## 2021-04-13 ENCOUNTER — Other Ambulatory Visit: Payer: Self-pay | Admitting: Primary Care

## 2021-04-13 DIAGNOSIS — G4700 Insomnia, unspecified: Secondary | ICD-10-CM

## 2021-04-18 ENCOUNTER — Encounter: Payer: Self-pay | Admitting: Primary Care

## 2021-04-18 ENCOUNTER — Ambulatory Visit: Payer: BC Managed Care – PPO | Admitting: Primary Care

## 2021-04-18 VITALS — BP 124/62 | HR 71 | Temp 98.6°F | Ht 59.0 in | Wt 115.0 lb

## 2021-04-18 DIAGNOSIS — G4700 Insomnia, unspecified: Secondary | ICD-10-CM

## 2021-04-18 DIAGNOSIS — F17200 Nicotine dependence, unspecified, uncomplicated: Secondary | ICD-10-CM

## 2021-04-18 DIAGNOSIS — I10 Essential (primary) hypertension: Secondary | ICD-10-CM | POA: Diagnosis not present

## 2021-04-18 NOTE — Assessment & Plan Note (Signed)
Failed numerous treatments, now seems to be failing Ambien.  Recommended she follow-up with neurology for other options. ?

## 2021-04-18 NOTE — Assessment & Plan Note (Signed)
Encouraged her to resume Chantix. ? ?Commended her on cutting back. ?

## 2021-04-18 NOTE — Assessment & Plan Note (Signed)
Controlled. ? ?Continue HCTZ 25 mg daily, amlodipine 10 mg daily, olmesartan 20 mg daily, metoprolol tartrate 25 mg twice daily. ? ?CMP pending. ?

## 2021-04-18 NOTE — Progress Notes (Signed)
? ?Subjective:  ? ? Patient ID: [name] [name], female    DOB: July 08, 1960, 61 y.o.   MRN: [id number] ? ?HPI ? ?[name] [name] is a very pleasant 61 y.o. female with a history of hypertension, insomnia, sleep apnea who presents today for follow-up of hypertension, nicotine dependence.  She would also like to mention sleep disturbance. ? ?1) Hypertension: She was last evaluated on 03/24/2021 for follow-up of hypertension and to discuss smoking cessation.  During this visit blood pressure was above goal in the office, also with home readings despite compliance to HCTZ 25 mg daily, amlodipine 10 mg daily.  Given her elevated readings we added olmesartan 20 mg daily, continued amlodipine 10 mg daily, continue HCTZ 25 mg daily, continued metoprolol tartrate twice daily. ? ? ?Since her last visit she is compliant to HCTZ 25 mg daily, amlodipine 10 mg daily, olmesartan 20 g daily, metoprolol 25 mg twice daily. She is checking her BP at home which is running 140's/60's.  ? ?BP Readings from Last 3 Encounters:  ?04/18/21 124/62  ?03/24/21 (!) 142/64  ?03/07/21 (!) 150/82  ? ?2) Nicotine Dependence: Chronic. During her last visit she requested assistance to quit smoking.  We discussed options and decided on Chantix.  Starter pack was sent to her pharmacy. Today she endorses picking up Chantix, took the fist week and has not taken additional pills as she wasn't entirely ready to quit. She has cut back to 5 cigarettes daily.  ? ?3) Sleep Disturbance: Chronic for years. She has no problem falling asleep with Ambien, but does wake during the night 4-5 times, not having to urinate. She denies anxiety. Is doing well on Paxil. She is compliant to her CPAP machine nightly, but will take off her CPAP machine and forget to put it back on. She has failed Trazodone, has been taking Ambien 10-mg for years.  ? ?She has an appointment scheduled with neurology in June for follow up of CPAP. ? ?Review of Systems  ?Respiratory:  Negative for  shortness of breath.   ?Cardiovascular:  Negative for chest pain.  ?Neurological:  Negative for dizziness and headaches.  ?Psychiatric/Behavioral:  Positive for sleep disturbance. The patient is not nervous/anxious.   ? ?   ? ? ?Past Medical History:  ?Diagnosis Date  ? Atypical chest pain   ? Back pain   ? chronic  ? Chronic insomnia   ? Depression   ? Eye abnormality   ? trouble seeing out of lt eye  ? GERD (gastroesophageal reflux disease)   ? Herpes zoster without complication 8/65/7846  ? Hyperlipidemia   ? Hypertension   ? PVD (peripheral vascular disease) ([place])   ? segmental femeropoliteal disease  ? Tobacco abuse   ? ? ?Social History  ? ?Socioeconomic History  ? Marital status: Divorced  ?  Spouse name: Not on file  ? Number of children: Not on file  ? Years of education: Not on file  ? Highest education level: Not on file  ?Occupational History  ? Not on file  ?Tobacco Use  ? Smoking status: Every Day  ?  Packs/day: 1.00  ?  Years: 45.00  ?  Pack years: 45.00  ?  Types: Cigarettes  ? Smokeless tobacco: Never  ?Vaping Use  ? Vaping Use: Never used  ?Substance and Sexual Activity  ? Alcohol use: No  ? Drug use: Never  ? Sexual activity: Not on file  ?Other Topics Concern  ? Not on file  ?Social History  Narrative  ? Divorced with 4 children   ? Current Smoker 1ppd   ? No alcohol  ? No illicit drug use     ? Occupation: Tax preparation  ?    ? ?Social Determinants of Health  ? ?Financial Resource Strain: Not on file  ?Food Insecurity: Not on file  ?Transportation Needs: Not on file  ?Physical Activity: Not on file  ?Stress: Not on file  ?Social Connections: Not on file  ?Intimate Partner Violence: Not on file  ? ? ?Past Surgical History:  ?Procedure Laterality Date  ? BUNIONECTOMY  1996  ? bilateral  ? CARDIAC CATHETERIZATION N/A 11/01/2014  ? Procedure: Left Heart Cath and Coronary Angiography;  Surgeon: Dionisio David, MD;  Location: [facility];  Service: Cardiovascular;  Laterality: N/A;  ?  CERVICAL DISCECTOMY  06/21/2006  ?  and fusion   ? [place]  ? ENDOMETRIAL ABLATION  June 2010  ? LOWER EXTREMITY ANGIOGRAPHY Left 08/05/2017  ? Procedure: LOWER EXTREMITY ANGIOGRAPHY;  Surgeon: Algernon Huxley, MD;  Location: [facility];  Service: Cardiovascular;  Laterality: Left;  ? NASAL SINUS SURGERY  1999  ? TUBAL LIGATION    ? WRIST SURGERY  2005  ? right wrist  ? ? ?Family History  ?Problem Relation Age of Onset  ? Emphysema Mother   ?     deceased age 62  ? Alcohol abuse Father   ?     deceased age 19  ? Psoriasis Sister   ? Asthma Sister   ? Healthy Daughter   ? ADD / ADHD Son   ? Healthy Son   ? ADD / ADHD Son   ? Healthy Son   ? Healthy Son   ? Sleep apnea Neg Hx   ? ? ?No Known Allergies ? ?Current Outpatient Medications on File Prior to Visit  ?Medication Sig Dispense Refill  ? albuterol (VENTOLIN HFA) 108 (90 Base) MCG/ACT inhaler Inhale 2 puffs into the lungs every 6 (six) hours as needed for wheezing or shortness of breath. 1 each 0  ? amLODipine (NORVASC) 10 MG tablet Take 1 tablet (10 mg total) by mouth daily. For blood pressure. 90 tablet 3  ? atorvastatin (LIPITOR) 10 MG tablet Take 1 tablet (10 mg total) by mouth daily. For cholesterol. 90 tablet 3  ? clobetasol cream (TEMOVATE) 8.52 % Apply 1 application topically 2 (two) times daily. 30 g 0  ? clopidogrel (PLAVIX) 75 MG tablet Take 1 tablet (75 mg total) by mouth daily. 90 tablet 3  ? fluticasone (FLONASE) 50 MCG/ACT nasal spray Place 1 spray into both nostrils 2 (two) times daily. 16 g 3  ? hydrochlorothiazide (HYDRODIURIL) 25 MG tablet Take 1 tablet (25 mg total) by mouth daily. For blood pressure. 90 tablet 1  ? levocetirizine (XYZAL) 5 MG tablet Take 1 tablet (5 mg total) by mouth every evening. For allergies. 90 tablet 1  ? metoprolol tartrate (LOPRESSOR) 25 MG tablet Take 1 tablet (25 mg total) by mouth 2 (two) times daily. For blood pressure. 180 tablet 3  ? naproxen sodium (ALEVE) 220 MG tablet Take 220 mg by mouth 2  (two) times daily as needed.    ? olmesartan (BENICAR) 20 MG tablet Take 1 tablet (20 mg total) by mouth daily. for blood pressure. 30 tablet 0  ? PARoxetine (PAXIL) 40 MG tablet Take 1 tablet (40 mg total) by mouth daily. For anxiety/depression. 90 tablet 3  ? Varenicline Tartrate, Starter, (  CHANTIX STARTING MONTH PAK) 0.5 MG X 11 & 1 MG X 42 TBPK Take 1 Package by mouth daily. Take one 0.5 mg tablet by mouth once daily for 3 days, then increase to one 0.5 mg tablet twice daily for 4 days, then increase to one 1 mg tablet twice daily. 53 each 0  ? zolpidem (AMBIEN) 10 MG tablet TAKE 1 TABLET BY MOUTH AT BEDTIME AS NEEDED FOR SLEEP 90 tablet 0  ? Adalimumab (HUMIRA PEN) 40 MG/0.4ML PNKT INJECT 40 MG UNDER THE SKIN EVERY 14 DAYS (Patient not taking: Reported on 04/18/2021) 2 each 2  ? ?No current facility-administered medications on file prior to visit.  ? ? ?BP 124/62   Pulse 71   Temp 98.6 ?F (37 ?C) (Oral)   Ht '4\' 11"'$  (1.499 m)   Wt 115 lb (52.2 kg)   SpO2 96%   BMI 23.23 kg/m?  ?Objective:  ? Physical Exam ?Cardiovascular:  ?   Rate and Rhythm: Normal rate and regular rhythm.  ?Pulmonary:  ?   Effort: Pulmonary effort is normal.  ?   Breath sounds: Normal breath sounds.  ?Musculoskeletal:  ?   Cervical back: Neck supple.  ?Skin: ?   General: Skin is warm and dry.  ? ? ? ? ? ?   ?Assessment & Plan:  ? ? ? ? ?This visit occurred during the SARS-CoV-2 public health emergency.  Safety protocols were in place, including screening questions prior to the visit, additional usage of staff PPE, and extensive cleaning of exam room while observing appropriate contact time as indicated for disinfecting solutions.  ?

## 2021-04-18 NOTE — Patient Instructions (Signed)
Stop by the lab prior to leaving today. I will notify you of your results once received.  ? ?Discussed your sleeping patterns with neurology. ? ?It was a pleasure to see you today! ? ? ?

## 2021-04-19 LAB — BASIC METABOLIC PANEL
BUN: 12 mg/dL (ref 6–23)
CO2: 27 mEq/L (ref 19–32)
Calcium: 9.3 mg/dL (ref 8.4–10.5)
Chloride: 98 mEq/L (ref 96–112)
Creatinine, Ser: 0.87 mg/dL (ref 0.40–1.20)
GFR: 71.26 mL/min (ref >60.00)
Glucose, Bld: 93 mg/dL (ref 70–99)
Potassium: 3.2 mEq/L — ABNORMAL LOW (ref 3.5–5.1)
Sodium: 135 mEq/L (ref 135–145)

## 2021-04-20 ENCOUNTER — Other Ambulatory Visit: Payer: Self-pay | Admitting: Primary Care

## 2021-04-20 DIAGNOSIS — E876 Hypokalemia: Secondary | ICD-10-CM

## 2021-04-20 MED ORDER — POTASSIUM CHLORIDE CRYS ER 20 MEQ PO TBCR: 20.0000 meq | EXTENDED_RELEASE_TABLET | Freq: Two times a day (BID) | ORAL | 0 refills | Status: DC

## 2021-04-27 ENCOUNTER — Other Ambulatory Visit (INDEPENDENT_AMBULATORY_CARE_PROVIDER_SITE_OTHER): Payer: BC Managed Care – PPO

## 2021-04-27 DIAGNOSIS — E876 Hypokalemia: Secondary | ICD-10-CM

## 2021-04-28 LAB — BASIC METABOLIC PANEL
BUN: 12 mg/dL (ref 6–23)
CO2: 29 mEq/L (ref 19–32)
Calcium: 9.4 mg/dL (ref 8.4–10.5)
Chloride: 96 mEq/L (ref 96–112)
Creatinine, Ser: 0.86 mg/dL (ref 0.40–1.20)
GFR: 72.24 mL/min (ref >60.00)
Glucose, Bld: 93 mg/dL (ref 70–99)
Potassium: 3.7 mEq/L (ref 3.5–5.1)
Sodium: 134 mEq/L — ABNORMAL LOW (ref 135–145)

## 2021-04-29 DIAGNOSIS — G4733 Obstructive sleep apnea (adult) (pediatric): Secondary | ICD-10-CM | POA: Diagnosis not present

## 2021-05-11 ENCOUNTER — Other Ambulatory Visit: Payer: Self-pay | Admitting: Primary Care

## 2021-05-11 DIAGNOSIS — I10 Essential (primary) hypertension: Secondary | ICD-10-CM

## 2021-05-29 DIAGNOSIS — G4733 Obstructive sleep apnea (adult) (pediatric): Secondary | ICD-10-CM | POA: Diagnosis not present

## 2021-06-14 ENCOUNTER — Ambulatory Visit: Payer: BC Managed Care – PPO | Admitting: Nurse Practitioner

## 2021-06-14 ENCOUNTER — Ambulatory Visit (INDEPENDENT_AMBULATORY_CARE_PROVIDER_SITE_OTHER)
Admission: RE | Admit: 2021-06-14 | Discharge: 2021-06-14 | Disposition: A | Discharge status: Home / Self Care | Payer: BC Managed Care – PPO | Source: Ambulatory Visit | Attending: Nurse Practitioner | Admitting: Nurse Practitioner

## 2021-06-14 ENCOUNTER — Encounter: Payer: Self-pay | Admitting: Nurse Practitioner

## 2021-06-14 VITALS — BP 142/60 | HR 70 | Temp 98.1°F | Ht 59.0 in | Wt 114.5 lb

## 2021-06-14 DIAGNOSIS — M25552 Pain in left hip: Secondary | ICD-10-CM | POA: Diagnosis not present

## 2021-06-14 DIAGNOSIS — M1612 Unilateral primary osteoarthritis, left hip: Secondary | ICD-10-CM | POA: Diagnosis not present

## 2021-06-14 DIAGNOSIS — Z9851 Tubal ligation status: Secondary | ICD-10-CM | POA: Diagnosis not present

## 2021-06-14 MED ORDER — KETOROLAC TROMETHAMINE 30 MG/ML IJ SOLN
30.0000 mg | Freq: Once | INTRAMUSCULAR | Status: AC
  Administered 2021-06-14: 30 mg via INTRAMUSCULAR

## 2021-06-14 NOTE — Progress Notes (Signed)
   Acute Office Visit  Subjective:     Patient ID: [name] [name], female    DOB: 06-Jul-1960, 61 y.o.   MRN: [id number]  Chief Complaint  Patient presents with   Hip Pain    L x 2 weeks      Patient is in today for hip pain  States that it has hurt intermittmently for the past extended period of time with long walks. Now it hurts with shorter walks and some sitting. Described as a sharp pain.  She has tried otc sauve. One for arthriris and one for pain and aleve that has not helped much.  Denies numbness or tingling. Positive for subjective weakness Review of Systems  Constitutional:  Negative for chills and fever.  Musculoskeletal:  Positive for back pain and joint pain.  Neurological:  Positive for weakness. Negative for tingling.        Objective:    BP (!) 142/60   Pulse 70   Temp 98.1 F (36.7 C) (Temporal)   Ht '4\' 11"'$  (1.499 m)   Wt 114 lb 8 oz (51.9 kg)   SpO2 98%   BMI 23.13 kg/m    Physical Exam Vitals and nursing note reviewed.  Constitutional:      Appearance: Normal appearance.  Cardiovascular:     Rate and Rhythm: Normal rate and regular rhythm.     Heart sounds: Normal heart sounds.  Pulmonary:     Effort: Pulmonary effort is normal.     Breath sounds: Normal breath sounds.  Musculoskeletal:        General: Tenderness present. No signs of injury.     Lumbar back: Bony tenderness present.     Left hip: Tenderness and bony tenderness present. No crepitus. Normal range of motion. Normal strength.       Legs:     Comments: Was able to elicit the pain with active range of motion and some passive range of motion.  Neurological:     General: No focal deficit present.     Mental Status: She is alert.     Deep Tendon Reflexes:     Reflex Scores:      Patellar reflexes are 2+ on the right side and 2+ on the left side.    Comments: Bilateral lower extremity strength 5/5.     No results found for any visits on 06/14/21.      Assessment & Plan:    Problem List Items Addressed This Visit       Other   Left hip pain - Primary    Been going on approximately 2 weeks.  Patient tried over-the-counter measures without great relief.  We will do x-ray in office.  Patient was on Humira in the past for psoriasis and possible psoriatic arthritis.  Patient only taking medication.  Did administer Toradol 30 mg IM x1 dose in office.  Patient given precautions regards to avoiding NSAIDs for the next 12 hours. Differentials include bursitis, arthritis, muscular involvement.      Relevant Orders   DG Hip Unilat W OR W/O Pelvis 2-3 Views Left    Meds ordered this encounter  Medications   ketorolac (TORADOL) 30 MG/ML injection 30 mg    Return if symptoms worsen or fail to improve.  Romilda Garret, NP

## 2021-06-14 NOTE — Patient Instructions (Signed)
Nice to you today I will be in touch with the xray results once I have them AVOID NSAIDS like ibuprofen, aleve, naproxen, motrin, BC/Goody powders for 12 hours.  Follow up if no improvement

## 2021-06-14 NOTE — Assessment & Plan Note (Signed)
Been going on approximately 2 weeks.  Patient tried over-the-counter measures without great relief.  We will do x-ray in office.  Patient was on Humira in the past for psoriasis and possible psoriatic arthritis.  Patient only taking medication.  Did administer Toradol 30 mg IM x1 dose in office.  Patient given precautions regards to avoiding NSAIDs for the next 12 hours. Differentials include bursitis, arthritis, muscular involvement.

## 2021-06-19 ENCOUNTER — Ambulatory Visit: Payer: BC Managed Care – PPO | Admitting: Adult Health

## 2021-06-29 DIAGNOSIS — G4733 Obstructive sleep apnea (adult) (pediatric): Secondary | ICD-10-CM | POA: Diagnosis not present

## 2021-07-10 ENCOUNTER — Other Ambulatory Visit: Payer: Self-pay | Admitting: Primary Care

## 2021-07-10 DIAGNOSIS — G4700 Insomnia, unspecified: Secondary | ICD-10-CM

## 2021-07-29 DIAGNOSIS — G4733 Obstructive sleep apnea (adult) (pediatric): Secondary | ICD-10-CM | POA: Diagnosis not present

## 2021-08-10 ENCOUNTER — Encounter: Payer: Self-pay | Admitting: *Deleted

## 2021-08-14 ENCOUNTER — Ambulatory Visit: Payer: BC Managed Care – PPO | Admitting: Adult Health

## 2021-09-25 DIAGNOSIS — I10 Essential (primary) hypertension: Secondary | ICD-10-CM

## 2021-09-25 MED ORDER — HYDROCHLOROTHIAZIDE 25 MG PO TABS: 25.0000 mg | ORAL_TABLET | Freq: Every day | ORAL | 0 refills | Status: DC

## 2021-10-04 ENCOUNTER — Other Ambulatory Visit: Payer: Self-pay | Admitting: Primary Care

## 2021-10-04 DIAGNOSIS — G4700 Insomnia, unspecified: Secondary | ICD-10-CM

## 2021-10-10 ENCOUNTER — Other Ambulatory Visit: Payer: Self-pay | Admitting: Primary Care

## 2021-10-10 DIAGNOSIS — E785 Hyperlipidemia, unspecified: Secondary | ICD-10-CM

## 2021-10-10 DIAGNOSIS — I700 Atherosclerosis of aorta: Secondary | ICD-10-CM

## 2021-10-10 DIAGNOSIS — I10 Essential (primary) hypertension: Secondary | ICD-10-CM

## 2021-10-10 DIAGNOSIS — F4323 Adjustment disorder with mixed anxiety and depressed mood: Secondary | ICD-10-CM

## 2021-10-11 ENCOUNTER — Ambulatory Visit (INDEPENDENT_AMBULATORY_CARE_PROVIDER_SITE_OTHER): Payer: Commercial Managed Care - HMO | Admitting: Primary Care

## 2021-10-11 ENCOUNTER — Encounter: Payer: Self-pay | Admitting: Primary Care

## 2021-10-11 VITALS — BP 128/62 | HR 65 | Temp 98.2°F | Ht 59.0 in | Wt 111.0 lb

## 2021-10-11 DIAGNOSIS — G8929 Other chronic pain: Secondary | ICD-10-CM

## 2021-10-11 DIAGNOSIS — F4323 Adjustment disorder with mixed anxiety and depressed mood: Secondary | ICD-10-CM

## 2021-10-11 DIAGNOSIS — E785 Hyperlipidemia, unspecified: Secondary | ICD-10-CM | POA: Diagnosis not present

## 2021-10-11 DIAGNOSIS — L409 Psoriasis, unspecified: Secondary | ICD-10-CM

## 2021-10-11 DIAGNOSIS — M5442 Lumbago with sciatica, left side: Secondary | ICD-10-CM

## 2021-10-11 DIAGNOSIS — I10 Essential (primary) hypertension: Secondary | ICD-10-CM | POA: Diagnosis not present

## 2021-10-11 DIAGNOSIS — J449 Chronic obstructive pulmonary disease, unspecified: Secondary | ICD-10-CM | POA: Diagnosis not present

## 2021-10-11 DIAGNOSIS — Z0001 Encounter for general adult medical examination with abnormal findings: Secondary | ICD-10-CM | POA: Diagnosis not present

## 2021-10-11 DIAGNOSIS — R232 Flushing: Secondary | ICD-10-CM

## 2021-10-11 DIAGNOSIS — I739 Peripheral vascular disease, unspecified: Secondary | ICD-10-CM

## 2021-10-11 DIAGNOSIS — I700 Atherosclerosis of aorta: Secondary | ICD-10-CM

## 2021-10-11 DIAGNOSIS — G4700 Insomnia, unspecified: Secondary | ICD-10-CM

## 2021-10-11 MED ORDER — PREDNISONE 20 MG PO TABS: ORAL_TABLET | ORAL | 0 refills | Status: DC

## 2021-10-11 MED ORDER — ALBUTEROL SULFATE HFA 108 (90 BASE) MCG/ACT IN AERS: 2.0000 | INHALATION_SPRAY | Freq: Four times a day (QID) | RESPIRATORY_TRACT | 0 refills | Status: DC | PRN

## 2021-10-11 MED ORDER — CYCLOBENZAPRINE HCL 5 MG PO TABS: 5.0000 mg | ORAL_TABLET | Freq: Two times a day (BID) | ORAL | 0 refills | Status: DC | PRN

## 2021-10-11 NOTE — Assessment & Plan Note (Signed)
Controlled. Resolved.

## 2021-10-11 NOTE — Assessment & Plan Note (Signed)
Continue atorvastatin 10 mg daily. Repeat lipid panel pending. 

## 2021-10-11 NOTE — Assessment & Plan Note (Signed)
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  Offered orthopedic referral for which she declines. Start prednisone tablets. Take two tablets my mouth once daily in the morning for four days, then one tablet once daily in the morning for four days.  Rx for cyclobenzaprine 5 mg provided to use BID PRN.  Follow up if no improvement.

## 2021-10-11 NOTE — Assessment & Plan Note (Signed)
No longer on Humira.  Not following with rheumatology at this time per her preference.  Continue to monitor.

## 2021-10-11 NOTE — Assessment & Plan Note (Signed)
Continue atorvastatin 10 mg daily, clopidogrel 75 mg daily. Repeat lipid panel pending.

## 2021-10-11 NOTE — Assessment & Plan Note (Signed)
Improved but with active symptoms. Offered neurology referral for which she declines.  Continue Ambien 10 mg HS

## 2021-10-11 NOTE — Assessment & Plan Note (Signed)
Declines Shingrix, pneumonia, and influenza vaccines. Declines pap smear, mammogram, and colonoscopy despite recommendations.   Discussed the importance of a healthy diet and regular exercise in order for weight loss, and to reduce the risk of further co-morbidity.  Exam stable. Labs pending.  Follow up in 1 year for repeat physical.

## 2021-10-11 NOTE — Assessment & Plan Note (Signed)
Controlled.  Continue paroxetine 40 mg daily.

## 2021-10-11 NOTE — Assessment & Plan Note (Addendum)
Continue clopidogrel 75 mg daily, atorvastatin 10 mg daily. Strongly advised she stop smoking. Asymptomatic.

## 2021-10-11 NOTE — Assessment & Plan Note (Addendum)
Controlled.  Continue amlodipine 10 mg daily, HCTZ 25 mg daily, olmesartan 20 mg daily, metoprolol tartrate 25 mg BID. CMP pending.

## 2021-10-11 NOTE — Progress Notes (Signed)
Subjective:    Patient ID: [name] [name], female    DOB: March 12, 1960, 61 y.o.   MRN: [id number]  HPI  [name] [name] is a very pleasant 61 y.o. female who presents today for complete physical and follow up of chronic conditions. She would also like to discuss back pain.  Chronic history of left lower back pain/SI joint pain. Symptoms began years ago, wax and wane historically. Over the last 1 month she's noticed constant pain to the left lower back with intermittent radiation to the left lower extremity through her foot. She describes her pain as achy, stabbing, uncomfortable. She's been taking Aleve for pain without improvement. Se's been told years ago that her "SI joints had a lot of problems" and that she needed surgery. She never underwent surgery.   Immunizations: -Tetanus: 2020 -Influenza: Declines  -Shingles: Declines  -Pneumonia: Declines   Diet: Fair diet.  Exercise: No regular exercise.  Eye exam: Completed years ago  Dental exam: Completed recently   Pap Smear: Completed years ago, declines   Mammogram: Completed years ago, declines   Colonoscopy: Never completed, declines colonoscopy and Cologuard. Lung Cancer Screening: Completed in January 2023   BP Readings from Last 3 Encounters:  10/11/21 128/62  06/14/21 (!) 142/60  04/18/21 124/62       Review of Systems  Constitutional:  Negative for unexpected weight change.  HENT:  Negative for rhinorrhea.   Respiratory:  Negative for cough and shortness of breath.   Cardiovascular:  Negative for chest pain.  Gastrointestinal:  Negative for constipation and diarrhea.  Genitourinary:  Negative for difficulty urinating.  Musculoskeletal:  Positive for arthralgias.       See HPI  Skin:  Negative for rash.  Allergic/Immunologic: Negative for environmental allergies.  Neurological:  Negative for dizziness, weakness, numbness and headaches.  Psychiatric/Behavioral:  Positive for sleep disturbance. The patient is not  nervous/anxious.          Past Medical History:  Diagnosis Date   Atypical chest pain    Back pain    chronic   Chronic insomnia    Depression    Eye abnormality    trouble seeing out of lt eye   GERD (gastroesophageal reflux disease)    Herpes zoster without complication 0/92/3300   Hot flashes 11/02/2011   Hyperlipidemia    Hypertension    PVD (peripheral vascular disease) ([place])    segmental femeropoliteal disease   Tobacco abuse     Social History   Socioeconomic History   Marital status: Divorced    Spouse name: Not on file   Number of children: Not on file   Years of education: Not on file   Highest education level: Not on file  Occupational History   Not on file  Tobacco Use   Smoking status: Every Day    Packs/day: 1.00    Years: 45.00    Total pack years: 45.00    Types: Cigarettes   Smokeless tobacco: Never  Vaping Use   Vaping Use: Never used  Substance and Sexual Activity   Alcohol use: No   Drug use: Never   Sexual activity: Not on file  Other Topics Concern   Not on file  Social History Narrative   Divorced with 4 children    Current Smoker 1ppd    No alcohol   No illicit drug use      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  Resource Strain: Not on file  Food Insecurity: Not on file  Transportation Needs: Not on file  Physical Activity: Not on file  Stress: Not on file  Social Connections: Not on file  Intimate Partner Violence: Not on file    Past Surgical History:  Procedure Laterality Date   BUNIONECTOMY  1996   bilateral   CARDIAC CATHETERIZATION N/A 11/01/2014   Procedure: Left Heart Cath and Coronary Angiography;  Surgeon: Dionisio David, MD;  Location: [facility];  Service: Cardiovascular;  Laterality: N/A;   CERVICAL DISCECTOMY  06/21/2006    and fusion    [place]   ENDOMETRIAL ABLATION  June 2010   LOWER EXTREMITY ANGIOGRAPHY Left 08/05/2017   Procedure: LOWER  EXTREMITY ANGIOGRAPHY;  Surgeon: Algernon Huxley, MD;  Location: [facility];  Service: Cardiovascular;  Laterality: Left;   NASAL SINUS SURGERY  1999   TUBAL LIGATION     WRIST SURGERY  2005   right wrist    Family History  Problem Relation Age of Onset   Emphysema Mother        deceased age 4   Alcohol abuse Father        deceased age 32   Psoriasis Sister    Asthma Sister    Healthy Daughter    ADD / ADHD Son    Healthy Son    ADD / ADHD Son    Healthy Son    Healthy Son    Sleep apnea Neg Hx     No Known Allergies  Current Outpatient Medications on File Prior to Visit  Medication Sig Dispense Refill   amLODipine (NORVASC) 10 MG tablet Take 1 tablet (10 mg total) by mouth daily. For blood pressure. 90 tablet 3   atorvastatin (LIPITOR) 10 MG tablet Take 1 tablet (10 mg total) by mouth daily. For cholesterol. 90 tablet 3   clobetasol cream (TEMOVATE) 2.11 % Apply 1 application topically 2 (two) times daily. 30 g 0   clopidogrel (PLAVIX) 75 MG tablet Take 1 tablet (75 mg total) by mouth daily. 90 tablet 3   fluticasone (FLONASE) 50 MCG/ACT nasal spray Place 1 spray into both nostrils 2 (two) times daily. 16 g 3   hydrochlorothiazide (HYDRODIURIL) 25 MG tablet Take 1 tablet (25 mg total) by mouth daily. For blood pressure. Office visit required for further refills. 90 tablet 0   levocetirizine (XYZAL) 5 MG tablet Take 1 tablet (5 mg total) by mouth every evening. For allergies. 90 tablet 1   metoprolol tartrate (LOPRESSOR) 25 MG tablet Take 1 tablet (25 mg total) by mouth 2 (two) times daily. For blood pressure. 180 tablet 3   naproxen sodium (ALEVE) 220 MG tablet Take 220 mg by mouth 2 (two) times daily as needed.     olmesartan (BENICAR) 20 MG tablet TAKE 1 TABLET(20 MG) BY MOUTH DAILY FOR BLOOD PRESSURE 90 tablet 2   PARoxetine (PAXIL) 40 MG tablet Take 1 tablet (40 mg total) by mouth daily. For anxiety/depression. 90 tablet 3   Varenicline Tartrate, Starter, (CHANTIX  STARTING MONTH PAK) 0.5 MG X 11 & 1 MG X 42 TBPK Take 1 Package by mouth daily. Take one 0.5 mg tablet by mouth once daily for 3 days, then increase to one 0.5 mg tablet twice daily for 4 days, then increase to one 1 mg tablet twice daily. 53 each 0   zolpidem (AMBIEN) 10 MG tablet TAKE 1 TABLET BY MOUTH AT BEDTIME AS NEEDED  FOR SLEEP 90 tablet 0   No current facility-administered medications on file prior to visit.    BP 128/62   Pulse 65   Temp 98.2 F (36.8 C) (Temporal)   Ht '4\' 11"'$  (1.499 m)   Wt 111 lb (50.3 kg)   SpO2 100%   BMI 22.42 kg/m  Objective:   Physical Exam HENT:     Right Ear: Tympanic membrane and ear canal normal.     Left Ear: Tympanic membrane and ear canal normal.     Nose: Nose normal.  Eyes:     Conjunctiva/sclera: Conjunctivae normal.     Pupils: Pupils are equal, round, and reactive to light.  Neck:     Thyroid: No thyromegaly.  Cardiovascular:     Rate and Rhythm: Normal rate and regular rhythm.     Heart sounds: No murmur heard. Pulmonary:     Effort: Pulmonary effort is normal.     Breath sounds: Normal breath sounds. No rales.  Abdominal:     General: Bowel sounds are normal.     Palpations: Abdomen is soft.     Tenderness: There is no abdominal tenderness.  Musculoskeletal:     Cervical back: Neck supple.     Lumbar back: No tenderness or bony tenderness. Decreased range of motion. Negative right straight leg raise test and negative left straight leg raise test.       Back:     Comments: 5/5 strength to bilateral lower extremities. Mild decrease in ROM with flexion to lumbar spine due to pain. Pain at left SI joint.  Lymphadenopathy:     Cervical: No cervical adenopathy.  Skin:    General: Skin is warm and dry.     Findings: No rash.  Neurological:     Mental Status: She is alert and oriented to person, place, and time.     Cranial Nerves: No cranial nerve deficit.     Deep Tendon Reflexes: Reflexes are normal and symmetric.   Psychiatric:        Mood and Affect: Mood normal.           Assessment & Plan:   Problem List Items Addressed This Visit       Cardiovascular and Mediastinum   Essential hypertension    Controlled.  Continue amlodipine 10 mg daily, HCTZ 25 mg daily, olmesartan 20 mg daily, metoprolol tartrate 25 mg BID. CMP pending.      Relevant Orders   CBC with Differential/Platelet   Comprehensive metabolic panel   PAD (peripheral artery disease) ([facility])    Continue clopidogrel 75 mg daily, atorvastatin 10 mg daily. Strongly advised she stop smoking. Asymptomatic.       Atherosclerosis of abdominal aorta ([facility])    Continue atorvastatin 10 mg daily, clopidogrel 75 mg daily. Repeat lipid panel pending.        RESOLVED: Hot flashes    Controlled. Resolved.         Respiratory   COPD (chronic obstructive pulmonary disease) ([facility])    Controlled.  Continue albuterol inhaler PRN. Continue to monitor.       Relevant Medications   predniSONE (DELTASONE) 20 MG tablet   albuterol (VENTOLIN HFA) 108 (90 Base) MCG/ACT inhaler     Musculoskeletal and Integument   Psoriasis    No longer on Humira.  Not following with rheumatology at this time per her preference.  Continue to monitor.         Other   Hyperlipidemia    Continue atorvastatin  10 mg daily. Repeat lipid panel pending.      Relevant Orders   Comprehensive metabolic panel   Lipid panel   INSOMNIA, CHRONIC    Improved but with active symptoms. Offered neurology referral for which she declines.  Continue Ambien 10 mg HS      Chronic back pain    Acute on chronic flare to left SI joint with sciatica.  Offered orthopedic referral for which she declines. Start prednisone tablets. Take two tablets my mouth once daily in the morning for four days, then one tablet once daily in the morning for four days.  Rx for cyclobenzaprine 5 mg provided to use BID PRN.  Follow up if no improvement.       Relevant  Medications   predniSONE (DELTASONE) 20 MG tablet   cyclobenzaprine (FLEXERIL) 5 MG tablet   Adjustment disorder with mixed anxiety and depressed mood    Controlled.  Continue paroxetine 40 mg daily.       Encounter for annual general medical examination with abnormal findings in adult - Primary    Declines Shingrix, pneumonia, and influenza vaccines. Declines pap smear, mammogram, and colonoscopy despite recommendations.   Discussed the importance of a healthy diet and regular exercise in order for weight loss, and to reduce the risk of further co-morbidity.  Exam stable. Labs pending.  Follow up in 1 year for repeat physical.           Pleas Koch, NP

## 2021-10-11 NOTE — Patient Instructions (Signed)
Stop by the lab prior to leaving today. I will notify you of your results once received.   Start prednisone tablets. Take two tablets my mouth once daily in the morning for four days, then one tablet once daily in the morning for four days.   You may take the cyclobenzaprine twice daily as needed for muscle spasms. This may cause drowsiness.   It was a pleasure to see you today!  Preventive Care 55-63 Years Old, Female Preventive care refers to lifestyle choices and visits with your health care provider that can promote health and wellness. Preventive care visits are also called wellness exams. What can I expect for my preventive care visit? Counseling Your health care provider may ask you questions about your: Medical history, including: Past medical problems. Family medical history. Pregnancy history. Current health, including: Menstrual cycle. Method of birth control. Emotional well-being. Home life and relationship well-being. Sexual activity and sexual health. Lifestyle, including: Alcohol, nicotine or tobacco, and drug use. Access to firearms. Diet, exercise, and sleep habits. Work and work Statistician. Sunscreen use. Safety issues such as seatbelt and bike helmet use. Physical exam Your health care provider will check your: Height and weight. These may be used to calculate your BMI (body mass index). BMI is a measurement that tells if you are at a healthy weight. Waist circumference. This measures the distance around your waistline. This measurement also tells if you are at a healthy weight and may help predict your risk of certain diseases, such as type 2 diabetes and high blood pressure. Heart rate and blood pressure. Body temperature. Skin for abnormal spots. What immunizations do I need?  Vaccines are usually given at various ages, according to a schedule. Your health care provider will recommend vaccines for you based on your age, medical history, and lifestyle or  other factors, such as travel or where you work. What tests do I need? Screening Your health care provider may recommend screening tests for certain conditions. This may include: Lipid and cholesterol levels. Diabetes screening. This is done by checking your blood sugar (glucose) after you have not eaten for a while (fasting). Pelvic exam and Pap test. Hepatitis B test. Hepatitis C test. HIV (human immunodeficiency virus) test. STI (sexually transmitted infection) testing, if you are at risk. Lung cancer screening. Colorectal cancer screening. Mammogram. Talk with your health care provider about when you should start having regular mammograms. This may depend on whether you have a family history of breast cancer. BRCA-related cancer screening. This may be done if you have a family history of breast, ovarian, tubal, or peritoneal cancers. Bone density scan. This is done to screen for osteoporosis. Talk with your health care provider about your test results, treatment options, and if necessary, the need for more tests. Follow these instructions at home: Eating and drinking  Eat a diet that includes fresh fruits and vegetables, whole grains, lean protein, and low-fat dairy products. Take vitamin and mineral supplements as recommended by your health care provider. Do not drink alcohol if: Your health care provider tells you not to drink. You are pregnant, may be pregnant, or are planning to become pregnant. If you drink alcohol: Limit how much you have to 0-1 drink a day. Know how much alcohol is in your drink. In the [place] one drink equals one 12 oz bottle of beer (355 mL), one 5 oz glass of wine (148 mL), or one 1 oz glass of hard liquor (44 mL). Lifestyle Brush your teeth every morning  and night with fluoride toothpaste. Floss one time each day. Exercise for at least 30 minutes 5 or more days each week. Do not use any products that contain nicotine or tobacco. These products include  cigarettes, chewing tobacco, and vaping devices, such as e-cigarettes. If you need help quitting, ask your health care provider. Do not use drugs. If you are sexually active, practice safe sex. Use a condom or other form of protection to prevent STIs. If you do not wish to become pregnant, use a form of birth control. If you plan to become pregnant, see your health care provider for a prepregnancy visit. Take aspirin only as told by your health care provider. Make sure that you understand how much to take and what form to take. Work with your health care provider to find out whether it is safe and beneficial for you to take aspirin daily. Find healthy ways to manage stress, such as: Meditation, yoga, or listening to music. Journaling. Talking to a trusted person. Spending time with friends and family. Minimize exposure to UV radiation to reduce your risk of skin cancer. Safety Always wear your seat belt while driving or riding in a vehicle. Do not drive: If you have been drinking alcohol. Do not ride with someone who has been drinking. When you are tired or distracted. While texting. If you have been using any mind-altering substances or drugs. Wear a helmet and other protective equipment during sports activities. If you have firearms in your house, make sure you follow all gun safety procedures. Seek help if you have been physically or sexually abused. What's next? Visit your health care provider once a year for an annual wellness visit. Ask your health care provider how often you should have your eyes and teeth checked. Stay up to date on all vaccines. This information is not intended to replace advice given to you by your health care provider. Make sure you discuss any questions you have with your health care provider. Document Revised: 06/15/2020 Document Reviewed: 06/15/2020 Elsevier Patient Education  [employer].

## 2021-10-11 NOTE — Assessment & Plan Note (Signed)
Controlled.  Continue albuterol inhaler PRN. Continue to monitor.

## 2021-10-12 LAB — COMPREHENSIVE METABOLIC PANEL
ALT: 7 U/L (ref 0–35)
AST: 11 U/L (ref 0–37)
Albumin: 4.1 g/dL (ref 3.5–5.2)
Alkaline Phosphatase: 116 U/L (ref 39–117)
BUN: 13 mg/dL (ref 6–23)
CO2: 25 mEq/L (ref 19–32)
Calcium: 9.5 mg/dL (ref 8.4–10.5)
Chloride: 96 mEq/L (ref 96–112)
Creatinine, Ser: 1.1 mg/dL (ref 0.40–1.20)
GFR: 53.59 mL/min — ABNORMAL LOW (ref >60.00)
Glucose, Bld: 99 mg/dL (ref 70–99)
Potassium: 4.1 mEq/L (ref 3.5–5.1)
Sodium: 131 mEq/L — ABNORMAL LOW (ref 135–145)
Total Bilirubin: 0.6 mg/dL (ref 0.2–1.2)
Total Protein: 6.8 g/dL (ref 6.0–8.3)

## 2021-10-12 LAB — CBC WITH DIFFERENTIAL/PLATELET
Basophils Absolute: 0 10*3/uL (ref 0.0–0.1)
Basophils Relative: 0.4 % (ref 0.0–3.0)
Eosinophils Absolute: 0.5 10*3/uL (ref 0.0–0.7)
Eosinophils Relative: 4.3 % (ref 0.0–5.0)
HCT: 35 % — ABNORMAL LOW (ref 36.0–46.0)
Hemoglobin: 11.9 g/dL — ABNORMAL LOW (ref 12.0–15.0)
Lymphocytes Relative: 20.3 % (ref 12.0–46.0)
Lymphs Abs: 2.5 10*3/uL (ref 0.7–4.0)
MCHC: 33.9 g/dL (ref 30.0–36.0)
MCV: 90.8 fl (ref 78.0–100.0)
Monocytes Absolute: 0.7 10*3/uL (ref 0.1–1.0)
Monocytes Relative: 5.6 % (ref 3.0–12.0)
Neutro Abs: 8.5 10*3/uL — ABNORMAL HIGH (ref 1.4–7.7)
Neutrophils Relative %: 69.4 % (ref 43.0–77.0)
Platelets: 267 10*3/uL (ref 150.0–400.0)
RBC: 3.85 Mil/uL — ABNORMAL LOW (ref 3.87–5.11)
RDW: 14.3 % (ref 11.5–15.5)
WBC: 12.2 10*3/uL — ABNORMAL HIGH (ref 4.0–10.5)

## 2021-10-12 LAB — LIPID PANEL
Cholesterol: 104 mg/dL (ref 0–200)
HDL: 33.7 mg/dL — ABNORMAL LOW (ref >39.00)
LDL Cholesterol: 49 mg/dL (ref 0–99)
NonHDL: 69.88
Total CHOL/HDL Ratio: 3
Triglycerides: 104 mg/dL (ref 0.0–149.0)
VLDL: 20.8 mg/dL (ref 0.0–40.0)

## 2021-10-13 ENCOUNTER — Other Ambulatory Visit: Payer: Self-pay | Admitting: Primary Care

## 2021-10-13 DIAGNOSIS — N289 Disorder of kidney and ureter, unspecified: Secondary | ICD-10-CM

## 2021-10-13 DIAGNOSIS — E871 Hypo-osmolality and hyponatremia: Secondary | ICD-10-CM

## 2021-10-27 ENCOUNTER — Other Ambulatory Visit (INDEPENDENT_AMBULATORY_CARE_PROVIDER_SITE_OTHER): Payer: Commercial Managed Care - HMO

## 2021-10-27 DIAGNOSIS — E871 Hypo-osmolality and hyponatremia: Secondary | ICD-10-CM | POA: Diagnosis not present

## 2021-10-27 DIAGNOSIS — N289 Disorder of kidney and ureter, unspecified: Secondary | ICD-10-CM

## 2021-10-28 LAB — CBC WITH DIFFERENTIAL/PLATELET
Absolute Monocytes: 1170 cells/uL — ABNORMAL HIGH (ref 200–950)
Basophils Absolute: 46 cells/uL (ref 0–200)
Basophils Relative: 0.3 %
Eosinophils Absolute: 477 cells/uL (ref 15–500)
Eosinophils Relative: 3.1 %
HCT: 32.1 % — ABNORMAL LOW (ref 35.0–45.0)
Hemoglobin: 11.3 g/dL — ABNORMAL LOW (ref 11.7–15.5)
Lymphs Abs: 2664 cells/uL (ref 850–3900)
MCH: 31.4 pg (ref 27.0–33.0)
MCHC: 35.2 g/dL (ref 32.0–36.0)
MCV: 89.2 fL (ref 80.0–100.0)
MPV: 10 fL (ref 7.5–12.5)
Monocytes Relative: 7.6 %
Neutro Abs: 11042 cells/uL — ABNORMAL HIGH (ref 1500–7800)
Neutrophils Relative %: 71.7 %
Platelets: 231 10*3/uL (ref 140–400)
RBC: 3.6 10*6/uL — ABNORMAL LOW (ref 3.80–5.10)
RDW: 13.1 % (ref 11.0–15.0)
Total Lymphocyte: 17.3 %
WBC: 15.4 10*3/uL — ABNORMAL HIGH (ref 3.8–10.8)

## 2021-10-28 LAB — BASIC METABOLIC PANEL
BUN/Creatinine Ratio: 12 (calc) (ref 6–22)
BUN: 14 mg/dL (ref 7–25)
CO2: 26 mmol/L (ref 20–32)
Calcium: 9.1 mg/dL (ref 8.6–10.4)
Chloride: 96 mmol/L — ABNORMAL LOW (ref 98–110)
Creat: 1.16 mg/dL — ABNORMAL HIGH (ref 0.50–1.05)
Glucose, Bld: 91 mg/dL (ref 65–99)
Potassium: 3.9 mmol/L (ref 3.5–5.3)
Sodium: 131 mmol/L — ABNORMAL LOW (ref 135–146)

## 2021-10-30 ENCOUNTER — Other Ambulatory Visit: Payer: Self-pay | Admitting: Primary Care

## 2021-10-30 DIAGNOSIS — I10 Essential (primary) hypertension: Secondary | ICD-10-CM

## 2021-10-30 MED ORDER — OLMESARTAN MEDOXOMIL 40 MG PO TABS: 40.0000 mg | ORAL_TABLET | Freq: Every day | ORAL | 0 refills | Status: DC

## 2021-11-15 ENCOUNTER — Encounter: Payer: Self-pay | Admitting: Primary Care

## 2021-11-15 ENCOUNTER — Ambulatory Visit: Payer: Commercial Managed Care - HMO | Admitting: Primary Care

## 2021-11-15 VITALS — BP 134/72 | HR 70 | Temp 98.6°F | Ht 59.0 in | Wt 112.0 lb

## 2021-11-15 DIAGNOSIS — I10 Essential (primary) hypertension: Secondary | ICD-10-CM | POA: Diagnosis not present

## 2021-11-15 DIAGNOSIS — E871 Hypo-osmolality and hyponatremia: Secondary | ICD-10-CM

## 2021-11-15 DIAGNOSIS — R7989 Other specified abnormal findings of blood chemistry: Secondary | ICD-10-CM | POA: Diagnosis not present

## 2021-11-15 NOTE — Assessment & Plan Note (Signed)
Controlled.  Continue olmesartan 40 mg daily, amlodipine 10 mg daily, metoprolol tartrate 25 mg BID. BMP pending.

## 2021-11-15 NOTE — Assessment & Plan Note (Signed)
Presumed to be secondary to HCTZ.  Repeat BMP pending today now that HCTZ has been removed from her regimen.

## 2021-11-15 NOTE — Progress Notes (Signed)
Subjective:    Patient ID: [name] [name], female    DOB: Jun 05, 1960, 61 y.o.   MRN: [id number]  Hypertension Pertinent negatives include no chest pain or shortness of breath.    [name] [name] is a very pleasant 61 y.o. female with a history of hypertension, PAD, COPD, hyperlipidemia, tobacco abuse who presents today for follow-up of hypertension and hyponatremia.  She was last evaluated in October 2023 for her annual physical.  Labs revealed hyponatremia on 10/11/2021 and again on 10/27/2021.  Given her hyponatremia we discontinue her hydrochlorothiazide and increased her olmesartan to 40 mg.  Her amlodipine 10 mg tablets and metoprolol tartrate 25 mg tablets twice daily were continued.  Since her last visit she is compliant to olmesartan 40 mg daily, amlodipine 10 mg daily, metoprolol tartrate 25 mg twice daily. She does not check BP at home. She has noticed increased urination since medication change.   She denies dysuria, foul smelling urine. She does continue to suffer with chronic fatigue. History of iron deficiency anemia and has had to undergo iron infusions in the past. She questions if this is causing her symptoms. CBC during her last visit was abnormal with leukocytosis and mild anemia.   BP Readings from Last 3 Encounters:  11/15/21 134/72  10/11/21 128/62  06/14/21 (!) 142/60      Review of Systems  Constitutional:  Positive for fatigue.  Respiratory:  Negative for shortness of breath.   Cardiovascular:  Negative for chest pain.  Genitourinary:  Negative for dysuria.         Past Medical History:  Diagnosis Date   Atypical chest pain    Back pain    chronic   Chronic insomnia    Depression    Eye abnormality    trouble seeing out of lt eye   GERD (gastroesophageal reflux disease)    Herpes zoster without complication 02/10/4707   Hot flashes 11/02/2011   Hyperlipidemia    Hypertension    PVD (peripheral vascular disease) ([place])    segmental femeropoliteal  disease   Tobacco abuse     Social History   Socioeconomic History   Marital status: Divorced    Spouse name: Not on file   Number of children: Not on file   Years of education: Not on file   Highest education level: Not on file  Occupational History   Not on file  Tobacco Use   Smoking status: Every Day    Packs/day: 1.00    Years: 45.00    Total pack years: 45.00    Types: Cigarettes   Smokeless tobacco: Never  Vaping Use   Vaping Use: Never used  Substance and Sexual Activity   Alcohol use: No   Drug use: Never   Sexual activity: Not on file  Other Topics Concern   Not on file  Social History Narrative   Divorced with 4 children    Current Smoker 1ppd    No alcohol   No illicit drug use      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 [employer] Strain: Not on file  Food Insecurity: Not on file  Transportation Needs: Not on file  Physical Activity: Not on file  Stress: Not on file  Social Connections: Not on file  Intimate Partner Violence: Not on file    Past Surgical History:  Procedure Laterality Date   BUNIONECTOMY  1996   bilateral   CARDIAC CATHETERIZATION N/A  11/01/2014   Procedure: Left Heart Cath and Coronary Angiography;  Surgeon: Dionisio David, MD;  Location: [facility];  Service: Cardiovascular;  Laterality: N/A;   CERVICAL DISCECTOMY  06/21/2006    and fusion    [place]   ENDOMETRIAL ABLATION  June 2010   LOWER EXTREMITY ANGIOGRAPHY Left 08/05/2017   Procedure: LOWER EXTREMITY ANGIOGRAPHY;  Surgeon: Algernon Huxley, MD;  Location: [facility];  Service: Cardiovascular;  Laterality: Left;   NASAL SINUS SURGERY  1999   TUBAL LIGATION     WRIST SURGERY  2005   right wrist    Family History  Problem Relation Age of Onset   Emphysema Mother        deceased age 46   Alcohol abuse Father        deceased age 31   Psoriasis Sister    Asthma Sister    Healthy Daughter    ADD /  ADHD Son    Healthy Son    ADD / ADHD Son    Healthy Son    Healthy Son    Sleep apnea Neg Hx     No Known Allergies  Current Outpatient Medications on File Prior to Visit  Medication Sig Dispense Refill   albuterol (VENTOLIN HFA) 108 (90 Base) MCG/ACT inhaler Inhale 2 puffs into the lungs every 6 (six) hours as needed for wheezing or shortness of breath. 1 each 0   amLODipine (NORVASC) 10 MG tablet TAKE 1 TABLET(10 MG) BY MOUTH DAILY FOR BLOOD PRESSURE 90 tablet 3   atorvastatin (LIPITOR) 10 MG tablet TAKE 1 TABLET(10 MG) BY MOUTH DAILY FOR CHOLESTEROL 90 tablet 3   clobetasol cream (TEMOVATE) 6.64 % Apply 1 application topically 2 (two) times daily. 30 g 0   clopidogrel (PLAVIX) 75 MG tablet TAKE 1 TABLET(75 MG) BY MOUTH DAILY 90 tablet 3   cyclobenzaprine (FLEXERIL) 5 MG tablet Take 1 tablet (5 mg total) by mouth 2 (two) times daily as needed for muscle spasms. 14 tablet 0   fluticasone (FLONASE) 50 MCG/ACT nasal spray Place 1 spray into both nostrils 2 (two) times daily. 16 g 3   levocetirizine (XYZAL) 5 MG tablet Take 1 tablet (5 mg total) by mouth every evening. For allergies. 90 tablet 1   metoprolol tartrate (LOPRESSOR) 25 MG tablet TAKE 1 TABLET(25 MG) BY MOUTH TWICE DAILY FOR BLOOD PRESSURE 180 tablet 3   naproxen sodium (ALEVE) 220 MG tablet Take 220 mg by mouth 2 (two) times daily as needed.     olmesartan (BENICAR) 40 MG tablet Take 1 tablet (40 mg total) by mouth daily. for blood pressure. 90 tablet 0   PARoxetine (PAXIL) 40 MG tablet TAKE 1 TABLET(40 MG) BY MOUTH DAILY FOR ANXIETY OR DEPRESSION 90 tablet 3   predniSONE (DELTASONE) 20 MG tablet Take two tablets my mouth once daily in the morning for four days, then one tablet once daily in the morning for four days. 12 tablet 0   Varenicline Tartrate, Starter, (CHANTIX STARTING MONTH PAK) 0.5 MG X 11 & 1 MG X 42 TBPK Take 1 Package by mouth daily. Take one 0.5 mg tablet by mouth once daily for 3 days, then increase to one 0.5  mg tablet twice daily for 4 days, then increase to one 1 mg tablet twice daily. 53 each 0   zolpidem (AMBIEN) 10 MG tablet TAKE 1 TABLET BY MOUTH AT BEDTIME AS NEEDED FOR SLEEP 90 tablet 0   No  current facility-administered medications on file prior to visit.    BP 134/72   Pulse 70   Temp 98.6 F (37 C) (Temporal)   Ht '4\' 11"'$  (1.499 m)   Wt 112 lb (50.8 kg)   SpO2 99%   BMI 22.62 kg/m  Objective:   Physical Exam Cardiovascular:     Rate and Rhythm: Normal rate and regular rhythm.  Pulmonary:     Effort: Pulmonary effort is normal.     Breath sounds: Normal breath sounds.  Musculoskeletal:     Cervical back: Neck supple.  Skin:    General: Skin is warm and dry.           Assessment & Plan:   Problem List Items Addressed This Visit       Cardiovascular and Mediastinum   Essential hypertension    Controlled.  Continue olmesartan 40 mg daily, amlodipine 10 mg daily, metoprolol tartrate 25 mg BID. BMP pending.        Other   Hyponatremia - Primary    Presumed to be secondary to HCTZ.  Repeat BMP pending today now that HCTZ has been removed from her regimen.       Relevant Orders   Basic metabolic panel   Other Visit Diagnoses     Abnormal CBC       Relevant Orders   CBC with Differential/Platelet   IBC + Ferritin          Pleas Koch, NP

## 2021-11-16 LAB — BASIC METABOLIC PANEL
BUN: 10 mg/dL (ref 6–23)
CO2: 27 mEq/L (ref 19–32)
Calcium: 9.8 mg/dL (ref 8.4–10.5)
Chloride: 102 mEq/L (ref 96–112)
Creatinine, Ser: 0.97 mg/dL (ref 0.40–1.20)
GFR: 62.28 mL/min (ref >60.00)
Glucose, Bld: 89 mg/dL (ref 70–99)
Potassium: 4.3 mEq/L (ref 3.5–5.1)
Sodium: 138 mEq/L (ref 135–145)

## 2021-11-16 LAB — CBC WITH DIFFERENTIAL/PLATELET
Basophils Absolute: 0.1 10*3/uL (ref 0.0–0.1)
Basophils Relative: 0.9 % (ref 0.0–3.0)
Eosinophils Absolute: 0.4 10*3/uL (ref 0.0–0.7)
Eosinophils Relative: 3.1 % (ref 0.0–5.0)
HCT: 39.7 % (ref 36.0–46.0)
Hemoglobin: 13.2 g/dL (ref 12.0–15.0)
Lymphocytes Relative: 21.4 % (ref 12.0–46.0)
Lymphs Abs: 2.5 10*3/uL (ref 0.7–4.0)
MCHC: 33.3 g/dL (ref 30.0–36.0)
MCV: 91.7 fl (ref 78.0–100.0)
Monocytes Absolute: 0.7 10*3/uL (ref 0.1–1.0)
Monocytes Relative: 6 % (ref 3.0–12.0)
Neutro Abs: 7.9 10*3/uL — ABNORMAL HIGH (ref 1.4–7.7)
Neutrophils Relative %: 68.6 % (ref 43.0–77.0)
Platelets: 314 10*3/uL (ref 150.0–400.0)
RBC: 4.33 Mil/uL (ref 3.87–5.11)
RDW: 14.4 % (ref 11.5–15.5)
WBC: 11.5 10*3/uL — ABNORMAL HIGH (ref 4.0–10.5)

## 2021-11-16 LAB — IBC + FERRITIN
Ferritin: 60.4 ng/mL (ref 10.0–291.0)
Iron: 82 ug/dL (ref 42–145)
Saturation Ratios: 20.8 % (ref 20.0–50.0)
TIBC: 393.4 ug/dL (ref 250.0–450.0)
Transferrin: 281 mg/dL (ref 212.0–360.0)

## 2021-12-25 ENCOUNTER — Other Ambulatory Visit: Payer: Self-pay | Admitting: Primary Care

## 2021-12-25 DIAGNOSIS — G4700 Insomnia, unspecified: Secondary | ICD-10-CM

## 2022-01-16 ENCOUNTER — Other Ambulatory Visit: Payer: Self-pay | Admitting: Acute Care

## 2022-01-16 DIAGNOSIS — F17210 Nicotine dependence, cigarettes, uncomplicated: Secondary | ICD-10-CM

## 2022-01-16 DIAGNOSIS — Z87891 Personal history of nicotine dependence: Secondary | ICD-10-CM

## 2022-01-25 ENCOUNTER — Other Ambulatory Visit: Payer: Commercial Managed Care - HMO

## 2022-03-07 ENCOUNTER — Other Ambulatory Visit: Payer: Self-pay | Admitting: Primary Care

## 2022-03-07 DIAGNOSIS — I10 Essential (primary) hypertension: Secondary | ICD-10-CM

## 2022-03-07 DIAGNOSIS — F17200 Nicotine dependence, unspecified, uncomplicated: Secondary | ICD-10-CM

## 2022-03-26 ENCOUNTER — Inpatient Hospital Stay: Admission: RE | Admit: 2022-03-26 | Payer: Commercial Managed Care - HMO | Source: Ambulatory Visit

## 2022-03-29 ENCOUNTER — Other Ambulatory Visit: Payer: Self-pay | Admitting: Primary Care

## 2022-03-29 DIAGNOSIS — G4700 Insomnia, unspecified: Secondary | ICD-10-CM

## 2022-06-25 DIAGNOSIS — G4700 Insomnia, unspecified: Secondary | ICD-10-CM

## 2022-06-25 MED ORDER — ZOLPIDEM TARTRATE 10 MG PO TABS: 10.0000 mg | ORAL_TABLET | Freq: Every evening | ORAL | 0 refills | Status: DC | PRN

## 2022-09-12 ENCOUNTER — Other Ambulatory Visit: Payer: Self-pay | Admitting: Primary Care

## 2022-09-12 DIAGNOSIS — I10 Essential (primary) hypertension: Secondary | ICD-10-CM

## 2022-09-12 NOTE — Telephone Encounter (Signed)
Lvmtcb, sent mychart message  

## 2022-09-12 NOTE — Telephone Encounter (Signed)
 Patient is due for CPE/follow up in late October, this will be required prior to any further refills.  Please schedule, thank you!

## 2022-09-13 NOTE — Telephone Encounter (Signed)
Patient has been scheduled

## 2022-09-13 NOTE — Progress Notes (Signed)
Office Visit Note  Patient: [name] [name]             Date of Birth: 1960-10-20           MRN: [id number]             PCP: Doreene Nest, NP Referring: Doreene Nest, NP Visit Date: 09/14/2022 Occupation: @GUAROCC @  Subjective:  Discuss restarting humira   History of Present Illness: [name] [name] is a 62 y.o. female with history of psoriatic arthritis and osteoarthritis. Patient was last seen in the office on 10/25/2020.  Patient has been off of Humira since fall 2022 and decided against ever restarting methotrexate.  Patient states that while taking methotrexate she had significant fatigue.  Patient states that she has had a severe flare of psoriasis.  She currently has psoriasis along her scalp, both elbows, gluteal region, right foot, and scattered patches on her lower extremities.  She uses CeraVe cream for moisturization and has a steroid cream she uses as needed.  Patient is currently having symptoms of plantar fasciitis in the right foot.  She is also having ongoing pain in both SI joints especially the left SI joint.  She denies any joint swelling currently.  Has been taking Aleve for symptomatic relief.  Patient states that her last uveitis flare was in November 2023.  She remains under the care of Dr. Clelia Croft and is currently enrolled in a medical study.  She has been getting monthly eye injections since June 2024 but is unsure of the medication since it is a blind study.    Activities of Daily Living:  Patient reports morning stiffness for 30 minutes.   Patient Reports nocturnal pain.  Difficulty dressing/grooming: Denies Difficulty climbing stairs: Denies Difficulty getting out of chair: Denies Difficulty using hands for taps, buttons, cutlery, and/or writing: Reports  Review of Systems  Constitutional:  Positive for fatigue.  HENT:  Negative for mouth sores and mouth dryness.   Eyes:  Negative for photophobia, pain, redness, itching, visual disturbance and dryness.   Respiratory:  Negative for shortness of breath.   Cardiovascular:  Negative for chest pain and palpitations.  Gastrointestinal:  Positive for diarrhea. Negative for blood in stool and constipation.  Endocrine: Negative for increased urination.  Genitourinary:  Negative for involuntary urination.  Musculoskeletal:  Positive for joint pain, joint pain, myalgias, morning stiffness, muscle tenderness and myalgias. Negative for gait problem, joint swelling and muscle weakness.  Skin:  Positive for color change and rash. Negative for hair loss and sensitivity to sunlight.  Allergic/Immunologic: Negative for susceptible to infections.  Neurological:  Negative for dizziness and headaches.  Hematological:  Negative for swollen glands.  Psychiatric/Behavioral:  Positive for depressed mood and sleep disturbance. The patient is not nervous/anxious.     PMFS History:  Patient Active Problem List   Diagnosis Date Noted   Ear fullness, bilateral 03/07/2021   High risk medication use 06/29/2019   Encounter for annual general medical examination with abnormal findings in adult 06/12/2018   Psoriasis 04/06/2016   Hyponatremia 11/01/2014   Atherosclerosis of abdominal aorta (HCC) 07/16/2014   Adjustment disorder with mixed anxiety and depressed mood 07/16/2014   Cervical postlaminectomy syndrome 01/10/2012   Chronic fatigue 11/02/2011   Chronic back pain 08/17/2011   COPD (chronic obstructive pulmonary disease) (HCC) 04/21/2010   Left hip pain 01/18/2010   Allergic rhinitis 07/01/2009   STRESS INCONTINENCE 03/16/2008   PAD (peripheral artery disease) (HCC) 07/10/2007   LEG PAIN, BILATERAL  06/24/2007   Hyperlipidemia 01/01/2007   TOBACCO ABUSE 01/01/2007   INSOMNIA, CHRONIC 01/01/2007   RESTLESS LEG SYNDROME 01/01/2007   Essential hypertension 01/01/2007    Past Medical History:  Diagnosis Date   Atypical chest pain    Back pain    chronic   Chronic insomnia    Depression    Eye  abnormality    trouble seeing out of lt eye   GERD (gastroesophageal reflux disease)    Herpes zoster without complication 02/17/2018   Hot flashes 11/02/2011   Hyperlipidemia    Hypertension    PVD (peripheral vascular disease) (HCC)    segmental femeropoliteal disease   Tobacco abuse     Family History  Problem Relation Age of Onset   Emphysema Mother        deceased age 49   Alcohol abuse Father        deceased age 16   Psoriasis Sister    Asthma Sister    Healthy Daughter    ADD / ADHD Son    Healthy Son    ADD / ADHD Son    Healthy Son    Healthy Son    Sleep apnea Neg Hx    Past Surgical History:  Procedure Laterality Date   BUNIONECTOMY  1996   bilateral   CARDIAC CATHETERIZATION N/A 11/01/2014   Procedure: Left Heart Cath and Coronary Angiography;  Surgeon: Laurier Nancy, MD;  Location: [facility];  Service: Cardiovascular;  Laterality: N/A;   CERVICAL DISCECTOMY  06/21/2006    and fusion    CESAREAN SECTION  1990   ENDOMETRIAL ABLATION  June 2010   LOWER EXTREMITY ANGIOGRAPHY Left 08/05/2017   Procedure: LOWER EXTREMITY ANGIOGRAPHY;  Surgeon: Annice Needy, MD;  Location: [facility];  Service: Cardiovascular;  Laterality: Left;   NASAL SINUS SURGERY  1999   TUBAL LIGATION     WRIST SURGERY  2005   right wrist   Social History   Social History Narrative   Divorced with 4 children    Current Smoker 1ppd    No alcohol   No illicit drug use      Occupation: Tax Engineer, site History  Administered Date(s) Administered   Td 06/21/2003   Tdap 06/12/2018     Objective: Vital Signs: BP 132/65 (BP Location: Left Arm, Patient Position: Sitting, Cuff Size: Normal)   Pulse (!) 57   Resp 16   Ht 4\' 11"  (1.499 m)   Wt 110 lb (49.9 kg)   BMI 22.22 kg/m    Physical Exam Vitals and nursing note reviewed.  Constitutional:      Appearance: She is well-developed.  HENT:     Head: Normocephalic and atraumatic.  Eyes:      Conjunctiva/sclera: Conjunctivae normal.  Cardiovascular:     Rate and Rhythm: Normal rate and regular rhythm.     Heart sounds: Normal heart sounds.  Pulmonary:     Effort: Pulmonary effort is normal.     Breath sounds: Normal breath sounds.  Abdominal:     General: Bowel sounds are normal.     Palpations: Abdomen is soft.  Musculoskeletal:     Cervical back: Normal range of motion.  Skin:    General: Skin is warm and dry.     Capillary Refill: Capillary refill takes less than 2 seconds.     Comments: Plaque psoriasis on extensor surface of both elbows, overlying right foot, along  hairline, and scattered on lower extremities.   Neurological:     Mental Status: She is alert and oriented to person, place, and time.  Psychiatric:        Behavior: Behavior normal.      Musculoskeletal Exam: C-spine, thoracic spine, and lumbar spine good ROM.  No midline spinal tenderness.  Tenderness of both SI joints, left > right. Shoulder joints, elbow joints, wrist joints, MCPs, PIPs, DIPs have good range of motion with no synovitis.  Complete fist formation bilaterally.  Hip joints have good range of motion with no groin pain.  Knee joints have good range of motion with no warmth or effusion.  Ankle joints have good range of motion with no tenderness or joint swelling.  No evidence of Achilles tendinitis.  Tenderness along the plantar fascia of the right foot.  CDAI Exam: CDAI Score: -- Patient Global: --; Provider Global: -- Swollen: 0 ; Tender: 2  Joint Exam 09/14/2022      Right  Left  Sacroiliac   Tender   Tender     Investigation: No additional findings.  Imaging: No results found.  Recent Labs: Lab Results  Component Value Date   WBC 11.5 (H) 11/15/2021   HGB 13.2 11/15/2021   PLT 314.0 11/15/2021   NA 138 11/15/2021   K 4.3 11/15/2021   CL 102 11/15/2021   CO2 27 11/15/2021   GLUCOSE 89 11/15/2021   BUN 10 11/15/2021   CREATININE 0.97 11/15/2021   BILITOT 0.6 10/11/2021    ALKPHOS 116 10/11/2021   AST 11 10/11/2021   ALT 7 10/11/2021   PROT 6.8 10/11/2021   ALBUMIN 4.1 10/11/2021   CALCIUM 9.8 11/15/2021   GFRAA 87 03/22/2020   QFTBGOLDPLUS NEGATIVE 10/25/2020    Speciality Comments: No specialty comments available.  Procedures:  No procedures performed Allergies: Patient has no known allergies.    Assessment / Plan:     Visit Diagnoses: Psoriatic arthropathy (HCC) - History of multiple arthralgias, sacroiliitis, uveitis, psoriasis, HLA-B27 positive, family history of psoriasis: Patient was last seen in the office on 10/25/2020.  She has been off of Humira since fall 2022.  She never resume methotrexate after her last office visit.  She presents today with plantar fasciitis involving the right foot as well as SI joint pain bilaterally.  No synovitis or dactylitis noted on examination today.  She is also having a severe flare of psoriasis involving her scalp, extensor surface of both elbows, scattered patches on her lower extremities, and right foot. She remains under the care of Dr. Clelia Croft for management of uveitis.  Her last flare of uveitis involved the right eye in November 2023.  She is currently enrolled in a clinical trial and is receiving intraocular injections once monthly.   She presented today to discuss treatment options.  Patient would like to resume Humira as prescribed.  Indications, contraindications, potential side effects of Humira were discussed today in detail.  All questions were addressed and consent was updated.  Plan to reapply for Humira through her insurance and once approved she will return to the office for administration of the first injection.  She will require updated lab work today, 1 month, then every 3 months.  Standing orders for CBC and CMP were placed today.  She was vies notify us if her symptoms do not improve or she has any side effects with Humira.  She will follow-up in the office in 2 months or sooner if  needed.  Counseled patient that  Humira is a TNF blocking agent.  Counseled patient on purpose, proper use, and adverse effects of Humira.  Reviewed the most common adverse effects including infections, headache, and injection site reactions. Discussed that there is the possibility of an increased risk of malignancy including non-melanoma skin cancer but it is not well understood if this increased risk is due to the medication or the disease state.  Advised patient to get yearly dermatology exams due to risk of skin cancer. Counseled patient that Humira should be held prior to scheduled surgery.  Counseled patient to avoid live vaccines while on Humira.  Recommend annual influenza, PCV 15 or PCV20 or Pneumovax 23, and Shingrix as indicated.  Reviewed the importance of regular labs while on Humira therapy.  Will monitor CBC and CMP 1 month after starting and then every 3 months routinely thereafter. Will monitor TB gold annually. Standing orders placed.    Provided patient with medication education material and answered all questions.  Patient consented to Humira.  Will upload consent into the media tab.  Reviewed storage instructions of Humira.  Advised initial injection must be administered in office.  Patient verbalized understanding.    Dose will be for psoriatic arthritis Humira 40 mg every 14 days.  Prescription pending lab results and/or insurance approval.  Uveitis - She established care with Dr. Sherryll Burger on 04/15/2020.  Currently enrolled in a clinical trial-receiving intraocular injections once monthly.  Last uveitis flare was November 2023 per patient.  Plan to restart Humira.  Psoriasis: Currently having a flare of psoriasis.  Psoriasis noted on the scalp, extensor surface of both elbows, scattered on her lower extremities, and overlying the right foot on the dorsal aspect and plantar aspect.  Patient declined a refill of a topical steroid cream.  She will be resuming Humira as prescribed once  approved by insurance.  High risk medication use -Plan to reapply for Humira 40 mg sq injections every 14 days. Patent has declined restarting Methotrexate due to excessive fatigue while taking it in the past.   Currently enrolled in a clinical trial-receiving intraocular injections once monthly--under care of Dr. Sherryll Burger.   CBC and BMP updated on 11/15/21. Orders for CBC and CMP released today.  Her next lab work will be due in 1 month and every 3 months.  Standing orders for CBC and CMP were placed today. Order for TB gold released today. Discussed the importance of holding humira if she develops signs or symptoms of an infection and to resume once the infection has completely cleared.   - Plan: CBC with Differential/Platelet, COMPLETE METABOLIC PANEL WITH GFR, QuantiFERON-TB Gold Plus  Screening for tuberculosis - Order for TB gold released today. Plan: QuantiFERON-TB Gold Plus  Sacroiliitis (HCC): Tenderness over both SI joints noted today.  She will be resuming Humira as previously prescribed.  HLA B27 (HLA B27 positive)  Primary osteoarthritis of both hands: She has PIP and DIP thickening consistent with osteoarthritis of both hands.  No synovitis or dactylitis noted today.  Primary osteoarthritis of both feet: Patient presents today with discomfort in the right foot consistent with plantar fasciitis.  No dactylitis noted.  Ankle joints have good range of motion no tenderness or synovitis.  Other medical conditions are listed as follows:  Abnormal SPEP - Referred to hematology 06/05/19  Family history of psoriasis  Cervical postlaminectomy syndrome  Chronic obstructive pulmonary disease, unspecified COPD type (HCC)  History of gastroesophageal reflux (GERD)  Essential hypertension: Blood pressure was 132/65 today in the office.  Smoker  Atherosclerosis of abdominal aorta (HCC)  PAD (peripheral artery disease) (HCC)  History of hyperlipidemia  Adjustment disorder with mixed  anxiety and depressed mood   Orders: Orders Placed This Encounter  Procedures   CBC with Differential/Platelet   COMPLETE METABOLIC PANEL WITH GFR   QuantiFERON-TB Gold Plus   CBC with Differential/Platelet   COMPLETE METABOLIC PANEL WITH GFR   No orders of the defined types were placed in this encounter.    Follow-Up Instructions: Return in about 2 months (around 11/14/2022) for Psoriatic arthritis.   Gearldine Bienenstock, PA-C  Note - This record has been created using Dragon software.  Chart creation errors have been sought, but may not always  have been located. Such creation errors do not reflect on  the standard of medical care.

## 2022-09-14 ENCOUNTER — Ambulatory Visit: Payer: Commercial Managed Care - HMO | Attending: Physician Assistant | Admitting: Physician Assistant

## 2022-09-14 ENCOUNTER — Encounter: Payer: Self-pay | Admitting: Physician Assistant

## 2022-09-14 VITALS — BP 132/65 | HR 57 | Resp 16 | Ht 59.0 in | Wt 110.0 lb

## 2022-09-14 DIAGNOSIS — M961 Postlaminectomy syndrome, not elsewhere classified: Secondary | ICD-10-CM

## 2022-09-14 DIAGNOSIS — L4050 Arthropathic psoriasis, unspecified: Secondary | ICD-10-CM

## 2022-09-14 DIAGNOSIS — F4323 Adjustment disorder with mixed anxiety and depressed mood: Secondary | ICD-10-CM

## 2022-09-14 DIAGNOSIS — R778 Other specified abnormalities of plasma proteins: Secondary | ICD-10-CM

## 2022-09-14 DIAGNOSIS — Z111 Encounter for screening for respiratory tuberculosis: Secondary | ICD-10-CM

## 2022-09-14 DIAGNOSIS — M461 Sacroiliitis, not elsewhere classified: Secondary | ICD-10-CM

## 2022-09-14 DIAGNOSIS — I700 Atherosclerosis of aorta: Secondary | ICD-10-CM

## 2022-09-14 DIAGNOSIS — Z8719 Personal history of other diseases of the digestive system: Secondary | ICD-10-CM

## 2022-09-14 DIAGNOSIS — Z840 Family history of diseases of the skin and subcutaneous tissue: Secondary | ICD-10-CM

## 2022-09-14 DIAGNOSIS — Z8639 Personal history of other endocrine, nutritional and metabolic disease: Secondary | ICD-10-CM

## 2022-09-14 DIAGNOSIS — M19041 Primary osteoarthritis, right hand: Secondary | ICD-10-CM

## 2022-09-14 DIAGNOSIS — Z1589 Genetic susceptibility to other disease: Secondary | ICD-10-CM

## 2022-09-14 DIAGNOSIS — Z79899 Other long term (current) drug therapy: Secondary | ICD-10-CM | POA: Diagnosis not present

## 2022-09-14 DIAGNOSIS — M19071 Primary osteoarthritis, right ankle and foot: Secondary | ICD-10-CM

## 2022-09-14 DIAGNOSIS — H209 Unspecified iridocyclitis: Secondary | ICD-10-CM

## 2022-09-14 DIAGNOSIS — L409 Psoriasis, unspecified: Secondary | ICD-10-CM

## 2022-09-14 DIAGNOSIS — J449 Chronic obstructive pulmonary disease, unspecified: Secondary | ICD-10-CM

## 2022-09-14 DIAGNOSIS — I739 Peripheral vascular disease, unspecified: Secondary | ICD-10-CM

## 2022-09-14 DIAGNOSIS — I10 Essential (primary) hypertension: Secondary | ICD-10-CM

## 2022-09-14 DIAGNOSIS — M19072 Primary osteoarthritis, left ankle and foot: Secondary | ICD-10-CM

## 2022-09-14 DIAGNOSIS — F17200 Nicotine dependence, unspecified, uncomplicated: Secondary | ICD-10-CM

## 2022-09-14 DIAGNOSIS — M19042 Primary osteoarthritis, left hand: Secondary | ICD-10-CM

## 2022-09-14 NOTE — Patient Instructions (Addendum)
Standing Labs We placed an order today for your standing lab work.   Please have your standing labs drawn in 1 month then every 3 months  Please have your labs drawn 2 weeks prior to your appointment so that the provider can discuss your lab results at your appointment, if possible.  Please note that you may see your imaging and lab results in MyChart before we have reviewed them. We will contact you once all results are reviewed. Please allow our office up to 72 hours to thoroughly review all of the results before contacting the office for clarification of your results.  WALK-IN LAB HOURS  Monday through Thursday from 8:00 am -12:30 pm and 1:00 pm-5:00 pm and Friday from 8:00 am-12:00 pm.  Patients with office visits requiring labs will be seen before walk-in labs.  You may encounter longer than normal wait times. Please allow additional time. Wait times may be shorter on  Monday and Thursday afternoons.  We do not book appointments for walk-in labs. We appreciate your patience and understanding with our staff.   Labs are drawn by [facility]. Please bring your co-pay at the time of your lab draw.  You may receive a bill from [facility] for your lab work.  Please note if you are on Hydroxychloroquine and and an order has been placed for a Hydroxychloroquine level,  you will need to have it drawn 4 hours or more after your last dose.  If you wish to have your labs drawn at another location, please call the office 24 hours in advance so we can fax the orders.  The office is located at [address], [address], [place], [place] [address]   If you have any questions regarding directions or hours of operation,  please call [phone].   As a reminder, please drink plenty of water prior to coming for your lab work. Thanks!  Adalimumab Injection What is this medication? ADALIMUMAB (ay da LIM yoo mab) treats autoimmune conditions, such as psoriasis, arthritis, Crohn's disease, and ulcerative  colitis. It works by slowing down an overactive immune system. It belongs to a group of medications called TNF inhibitors. It is a monoclonal antibody. This medicine may be used for other purposes; ask your health care provider or pharmacist if you have questions. COMMON BRAND NAME(S): ABRILADA, AMJEVITA, CYLTEZO, HADLIMA, Hulio, Hulio PEN, Humira, HUMIRA PEN, Hyrimoz, Idacio, Simlandi, Yuflyma, YUSIMRY What should I tell my care team before I take this medication? They need to know if you have any of these conditions: Cancer Diabetes (high blood sugar) Having surgery Heart disease Hepatitis B Immune system problems Infections, such as tuberculosis (TB) or other bacterial, fungal, or viral infections Multiple sclerosis Recent or upcoming vaccine An unusual or allergic reaction to adalimumab, mannitol, latex, rubber, other medications, foods, dyes, or preservatives Pregnant or trying to get pregnant Breast-feeding How should I use this medication? This medication is injected under the skin. It may be given by your care team in a hospital or clinic setting. It may also be given at home. If you get this medication at home, you will be taught how to prepare and give it. Use exactly as directed. Take it as directed on the prescription label. Keep taking it unless your care team tells you to stop. This medication comes with INSTRUCTIONS FOR USE. Ask your pharmacist for directions on how to use this medication. Read the information carefully. Talk to your pharmacist or care team if you have questions. It is important that you  put your used needles and syringes in a special sharps container. Do not put them in a trash can. If you do not have a sharps container, call your pharmacist or care team to get one. A special MedGuide will be given to you by the pharmacist with each prescription and refill. If you are getting this medication in a hospital or clinic, a special MedGuide will be given to you before  each treatment. Be sure to read this information carefully each time. Talk to your care team about the use of this medication in children. While it be prescribed for children as young as 2 years for selected conditions, precautions do apply. Overdosage: If you think you have taken too much of this medicine contact a poison control center or emergency room at once. NOTE: This medicine is only for you. Do not share this medicine with others. What if I miss a dose? If you get this medication at the hospital or clinic: it is important not to miss your dose. Call your care team if you are unable to keep an appointment. If you give yourself this medication at home: If you miss a dose, take it as soon as you can. If it is almost time for your next dose, take only that dose. Do not take double or extra doses. Call your care team with questions. What may interact with this medication? Do not take this medication with any of the following: Abatacept Anakinra Biologic medications, such as certolizumab, etanercept, golimumab, infliximab Live virus vaccines This medication may also interact with the following: Cyclosporine Theophylline Vaccines Warfarin This list may not describe all possible interactions. Give your health care provider a list of all the medicines, herbs, non-prescription drugs, or dietary supplements you use. Also tell them if you smoke, drink alcohol, or use illegal drugs. Some items may interact with your medicine. What should I watch for while using this medication? Visit your care team for regular checks on your progress. Tell your care team if your symptoms do not start to get better or if they get worse. You will be tested for tuberculosis (TB) before you start this medication. If your care team prescribes any medication for TB, you should start taking the TB medication before starting this medication. Make sure to finish the full course of TB medication. This medication may increase  your risk of getting an infection. Call your care team for advice if you get a fever, chills, sore throat, or other symptoms of a cold or flu. Do not treat yourself. Try to avoid being around people who are sick. Talk to your care team about your risk of cancer. You may be more at risk for certain types of cancer if you take this medication. What side effects may I notice from receiving this medication? Side effects that you should report to your care team as soon as possible: Allergic reactions--skin rash, itching, hives, swelling of the face, lips, tongue, or throat Aplastic anemia--unusual weakness or fatigue, dizziness, headache, trouble breathing, increased bleeding or bruising Body pain, tingling, or numbness Heart failure--shortness of breath, swelling of the ankles, feet, or hands, sudden weight gain, unusual weakness or fatigue Infection--fever, chills, cough, sore throat, wounds that don't heal, pain or trouble when passing urine, general feeling of discomfort or being unwell Lupus-like syndrome--joint pain, swelling, or stiffness, butterfly-shaped rash on the face, rashes that get worse in the sun, fever, unusual weakness or fatigue Unusual bruising or bleeding Side effects that usually do not require medical  attention (report to your care team if they continue or are bothersome): Headache Nausea Pain, redness, or irritation at injection site Runny or stuffy nose Sore throat Stomach pain This list may not describe all possible side effects. Call your doctor for medical advice about side effects. You may report side effects to FDA at 1-800-FDA-1088. Where should I keep my medication? Keep out of the reach of children and pets. Store in the refrigerator. Do not freeze. Keep this medication in the original packaging until you are ready to take it. Protect from light. Get rid of any unused medication after the expiration date. This medication may be stored at room temperature for up to  14 days. Keep this medication in the original packaging. Protect from light. If it is stored at room temperature, get rid of any unused medication after 14 days or after it expires, whichever is first. To get rid of medications that are no longer needed or have expired: Take the medication to a medication take-back program. Check with your pharmacy or law enforcement to find a location. If you cannot return the medication, ask your pharmacist or care team how to get rid of this medication safely. NOTE: This sheet is a summary. It may not cover all possible information. If you have questions about this medicine, talk to your doctor, pharmacist, or health care provider.  2024 Elsevier/Gold Standard (2021-03-03 00:00:00)

## 2022-09-17 ENCOUNTER — Other Ambulatory Visit: Payer: Self-pay | Admitting: *Deleted

## 2022-09-17 DIAGNOSIS — L4050 Arthropathic psoriasis, unspecified: Secondary | ICD-10-CM

## 2022-09-17 DIAGNOSIS — Z1589 Genetic susceptibility to other disease: Secondary | ICD-10-CM

## 2022-09-17 DIAGNOSIS — M461 Sacroiliitis, not elsewhere classified: Secondary | ICD-10-CM

## 2022-09-17 DIAGNOSIS — R778 Other specified abnormalities of plasma proteins: Secondary | ICD-10-CM

## 2022-09-17 DIAGNOSIS — H209 Unspecified iridocyclitis: Secondary | ICD-10-CM

## 2022-09-17 DIAGNOSIS — L409 Psoriasis, unspecified: Secondary | ICD-10-CM

## 2022-09-17 DIAGNOSIS — Z79899 Other long term (current) drug therapy: Secondary | ICD-10-CM

## 2022-09-17 LAB — CBC WITH DIFFERENTIAL/PLATELET
Absolute Monocytes: 695 {cells}/uL (ref 200–950)
Basophils Absolute: 46 {cells}/uL (ref 0–200)
Basophils Relative: 0.4 %
Eosinophils Absolute: 388 {cells}/uL (ref 15–500)
Eosinophils Relative: 3.4 %
HCT: 37.9 % (ref 35.0–45.0)
Hemoglobin: 12.3 g/dL (ref 11.7–15.5)
Lymphs Abs: 2531 {cells}/uL (ref 850–3900)
MCH: 30.1 pg (ref 27.0–33.0)
MCHC: 32.5 g/dL (ref 32.0–36.0)
MCV: 92.7 fL (ref 80.0–100.0)
MPV: 10.1 fL (ref 7.5–12.5)
Monocytes Relative: 6.1 %
Neutro Abs: 7741 {cells}/uL (ref 1500–7800)
Neutrophils Relative %: 67.9 %
Platelets: 222 10*3/uL (ref 140–400)
RBC: 4.09 10*6/uL (ref 3.80–5.10)
RDW: 13.2 % (ref 11.0–15.0)
Total Lymphocyte: 22.2 %
WBC: 11.4 10*3/uL — ABNORMAL HIGH (ref 3.8–10.8)

## 2022-09-17 LAB — COMPLETE METABOLIC PANEL WITH GFR
AG Ratio: 1.7 (calc) (ref 1.0–2.5)
ALT: 8 U/L (ref 6–29)
AST: 9 U/L — ABNORMAL LOW (ref 10–35)
Albumin: 4.1 g/dL (ref 3.6–5.1)
Alkaline phosphatase (APISO): 109 U/L (ref 37–153)
BUN/Creatinine Ratio: 28 (calc) — ABNORMAL HIGH (ref 6–22)
BUN: 27 mg/dL — ABNORMAL HIGH (ref 7–25)
CO2: 26 mmol/L (ref 20–32)
Calcium: 9.7 mg/dL (ref 8.6–10.4)
Chloride: 103 mmol/L (ref 98–110)
Creat: 0.96 mg/dL (ref 0.50–1.05)
Globulin: 2.4 g/dL (ref 1.9–3.7)
Glucose, Bld: 101 mg/dL — ABNORMAL HIGH (ref 65–99)
Potassium: 4.3 mmol/L (ref 3.5–5.3)
Sodium: 139 mmol/L (ref 135–146)
Total Bilirubin: 0.4 mg/dL (ref 0.2–1.2)
Total Protein: 6.5 g/dL (ref 6.1–8.1)
eGFR: 66 mL/min/{1.73_m2} (ref >=60)

## 2022-09-17 LAB — QUANTIFERON-TB GOLD PLUS
Mitogen-NIL: 9.09 [IU]/mL
NIL: 0.01 [IU]/mL
QuantiFERON-TB Gold Plus: NEGATIVE
TB1-NIL: 0 [IU]/mL
TB2-NIL: 0 [IU]/mL

## 2022-09-17 NOTE — Progress Notes (Signed)
CBC and CMP stable. Recheck lab work 1 months then every 3 months after restarting humira.

## 2022-09-17 NOTE — Progress Notes (Signed)
TB gold negative

## 2022-09-18 ENCOUNTER — Telehealth: Payer: Self-pay | Admitting: Pharmacist

## 2022-09-18 NOTE — Telephone Encounter (Addendum)
Please start Humira BIV  Dose: 40mg  SQ every 14 days Dx: PsA/Pso  ----- Message from Nurse Richardo Priest sent at 09/17/2022  1:37 PM EDT ----- Please apply for Humira per Sherron Ales, PA-C. Thanks!

## 2022-09-19 NOTE — Telephone Encounter (Signed)
Submitted a Prior Authorization request to [employer] for HUMIRA via CoverMyMeds. Will update once we receive a response.  Key: [id number]

## 2022-09-20 ENCOUNTER — Other Ambulatory Visit: Payer: Self-pay | Admitting: Primary Care

## 2022-09-20 DIAGNOSIS — G4700 Insomnia, unspecified: Secondary | ICD-10-CM

## 2022-09-21 NOTE — Telephone Encounter (Signed)
Received notification from [employer] regarding a prior authorization for HUMIRA. Authorization has been APPROVED from 09/19/22 to 09/19/23.  Patient must fill through [facility]: [phone]  Authorization # [id number]  Humira copay card per Complete [employer] portal: ID: [id number] Issued:06/26/2019 Rx GROUP: [id number] Rx BIN: [id number] Rx PCN: [id number]  Patient can be scheduled for Humira new start  Chesley Mires, PharmD, MPH, BCPS, CPP Clinical Pharmacist (Rheumatology and Pulmonology)

## 2022-09-24 NOTE — Telephone Encounter (Signed)
ATC patient to schedule Humira new start. Unable to reach. Left VM  Chesley Mires, PharmD, MPH, BCPS, CPP Clinical Pharmacist (Rheumatology and Pulmonology)

## 2022-09-26 NOTE — Telephone Encounter (Signed)
ATC #2 - called patient to schedule Humira enw start. Unable to reach. Left VM requesting return call  Chesley Mires, PharmD, MPH, BCPS, CPP Clinical Pharmacist (Rheumatology and Pulmonology)

## 2022-10-02 NOTE — Telephone Encounter (Signed)
Patient scheduled for Humira new start on 10/04/22

## 2022-10-04 ENCOUNTER — Ambulatory Visit: Payer: Managed Care, Other (non HMO) | Attending: Rheumatology | Admitting: Pharmacist

## 2022-10-04 DIAGNOSIS — Z7189 Other specified counseling: Secondary | ICD-10-CM

## 2022-10-04 DIAGNOSIS — L409 Psoriasis, unspecified: Secondary | ICD-10-CM

## 2022-10-04 DIAGNOSIS — Z79899 Other long term (current) drug therapy: Secondary | ICD-10-CM

## 2022-10-04 DIAGNOSIS — H209 Unspecified iridocyclitis: Secondary | ICD-10-CM

## 2022-10-04 DIAGNOSIS — L4050 Arthropathic psoriasis, unspecified: Secondary | ICD-10-CM

## 2022-10-04 MED ORDER — HUMIRA (2 PEN) 40 MG/0.4ML ~~LOC~~ AJKT: 40.0000 mg | AUTO-INJECTOR | SUBCUTANEOUS | 0 refills | Status: DC

## 2022-10-04 NOTE — Patient Instructions (Signed)
Your next HUMIRA dose is due on 10/18/22, 11/01/2022, and every 14 days thereafter  HOLD HUMIRA if you have signs or symptoms of an infection. You can resume once you feel better or back to your baseline. HOLD HUMIRA if you start antibiotics to treat an infection. HOLD HUMIRA around the time of surgery/procedures. Your surgeon will be able to provide recommendations on when to hold BEFORE and when you are cleared to RESUME.  Pharmacy information: Your prescription will be shipped from [facility]. Their phone number is [phone] Please call to schedule shipment and confirm address. They will mail your medication to your home.  Cost information: Your copay should be affordable. If you call the pharmacy and it is not affordable, please double-check that they are billing through your copay card as secondary coverage. That copay card information is: ID: [id number] Rx GROUP: [id number] Rx BIN: [id number] Rx PCN: [id number] Issued:06/26/2019  Labs are due in 1 month then every 3 months. Lab hours are from Monday to Thursday 8am-12:30pm and 1pm-5pm and Friday 8am-12pm. You do not need an appointment if you come for labs during these times. If you'd like to go to a [facility] or [facility] closer to home, please call our clinic 48 hours prior to lab date so we can release orders in a timely manner.  How to manage an injection site reaction: Remember the 5 C's: COUNTER - leave on the counter at least 30 minutes but up to overnight to bring medication to room temperature. This may help prevent stinging COLD - place something cold (like an ice gel pack or cold water bottle) on the injection site just before cleansing with alcohol. This may help reduce pain CLARITIN - use Claritin (generic name is loratadine) for the first two weeks of treatment or the day of, the day before, and the day after injecting. This will help to minimize injection site reactions CORTISONE CREAM - apply if injection site is  irritated and itching CALL ME - if injection site reaction is bigger than the size of your fist, looks infected, blisters, or if you develop hives

## 2022-10-04 NOTE — Progress Notes (Signed)
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 Sherryll Burger. Previously took MTX but discontinued due to excessive fatigue  Patient running a fever or have signs/symptoms of infection? No  Patient currently on antibiotics for the treatment of infection? No  Patient have any upcoming invasive procedures/surgeries? No  Objective: CMP     Component Value Date/Time   NA 139 09/14/2022 1024   K 4.3 09/14/2022 1024   CL 103 09/14/2022 1024   CO2 26 09/14/2022 1024   GLUCOSE 101 (H) 09/14/2022 1024   BUN 27 (H) 09/14/2022 1024   CREATININE 0.96 09/14/2022 1024   CALCIUM 9.7 09/14/2022 1024   PROT 6.5 09/14/2022 1024   ALBUMIN 4.1 10/11/2021 1429   AST 9 (L) 09/14/2022 1024   ALT 8 09/14/2022 1024   ALKPHOS 116 10/11/2021 1429   BILITOT 0.4 09/14/2022 1024   GFRNONAA 75 03/22/2020 1540   GFRAA 87 03/22/2020 1540    CBC    Component Value Date/Time   WBC 11.4 (H) 09/14/2022 1024   RBC 4.09 09/14/2022 1024   HGB 12.3 09/14/2022 1024   HGB 11.6 05/27/2008 1610   HCT 37.9 09/14/2022 1024   HCT 34.8 05/27/2008 1610   PLT 222 09/14/2022 1024   PLT 317 05/27/2008 1610   MCV 92.7 09/14/2022 1024   MCV 84.6 05/27/2008 1610   MCH 30.1 09/14/2022 1024   MCHC 32.5 09/14/2022 1024   RDW 13.2 09/14/2022 1024   RDW 27.0 (H) 05/27/2008 1610   LYMPHSABS 2,531 09/14/2022 1024   LYMPHSABS 2.6 05/27/2008 1610   MONOABS 0.7 11/15/2021 1418   MONOABS 0.6 05/27/2008 1610   EOSABS 388 09/14/2022 1024   EOSABS 0.2 05/27/2008 1610   BASOSABS 46 09/14/2022 1024   BASOSABS 0.0 05/27/2008 1610    Baseline Immunosuppressant Therapy Labs TB GOLD    Latest Ref Rng & Units 09/14/2022   10:24 AM  Quantiferon TB Gold  Quantiferon TB Gold Plus NEGATIVE NEGATIVE    Hepatitis Panel    Latest Ref Rng & Units 06/02/2019   11:00 AM  Hepatitis  Hep B Surface Ag NON-REACTI  NON-REACTIVE   Hep B IgM NON-REACTI NON-REACTIVE   Hep C Ab NON-REACTI NON-REACTIVE    HIV Lab Results  Component Value Date   HIV NON-REACTIVE 06/02/2019   HIV NONREACTIVE 04/06/2016   Immunoglobulins    Latest Ref Rng & Units 06/02/2019   11:00 AM  Immunoglobulin Electrophoresis  IgA  47 - 310 mg/dL 086   IgG 578 - 4,696 mg/dL 295   IgM 50 - 284 mg/dL 132    SPEP    Latest Ref Rng & Units 09/14/2022   10:24 AM  Serum Protein Electrophoresis  Total Protein 6.1 - 8.1 g/dL 6.5    G4WN No results found for: "G6PDH" TPMT No results found for: "TPMT"   Chest x-ray: 01/26/2021 - Lung-RADS 2, benign appearance or behavior. Continue annual screening with low-dose chest CT without contrast in 12 months.  Assessment/Plan:  Reviewed importance of holding Humira with signs/symptoms of an infections, if antibiotics are prescribed to treat an active infection, and with invasive procedures  Demonstrated proper injection technique with Humira pen demo device  Patient able to demonstrate proper injection technique using the teach back method.  Patient self injected in the right lower abdomen with:  Sample Medication: Humira 40mg /0.6mL pen  injector NDC: [id number] Lot: [id number] Expiration: 05/2023  Patient tolerated well.  Observed for 30 mins in office for adverse reaction. Patient denies itchiness and irritation at injection., No swelling or redness noted., and Reviewed injection site reaction management with patient verbally and printed information for review in AVS  Patient is to return in 1 month for labs and 6-8 weeks for follow-up appointment.  Standing orders for CBC/CMP remain in place.  TB gold will be monitored yearly.  Referral to [facility] placed today for yearly skin checks while on TNF inhibitor due to risk for non melanoma skin cancer as well as to co-manage her psoriasis  Humira approved through insurance .   Rx sent to: [facility]:  [phone].  Patient provided with pharmacy phone number and advised to call later this week to schedule shipment to home.  Patient will continue Humira 40mg  subcut every 14 days as monotherapy  All questions encouraged and answered.  Instructed patient to call with any further questions or concerns.  Chesley Mires, PharmD, MPH, BCPS, CPP Clinical Pharmacist (Rheumatology and Pulmonology)  10/04/2022 8:25 AM

## 2022-10-05 ENCOUNTER — Other Ambulatory Visit: Payer: Self-pay | Admitting: Primary Care

## 2022-10-05 DIAGNOSIS — Z1212 Encounter for screening for malignant neoplasm of rectum: Secondary | ICD-10-CM

## 2022-10-05 DIAGNOSIS — Z1211 Encounter for screening for malignant neoplasm of colon: Secondary | ICD-10-CM

## 2022-10-16 ENCOUNTER — Encounter: Payer: Managed Care, Other (non HMO) | Admitting: Primary Care

## 2022-10-16 ENCOUNTER — Other Ambulatory Visit: Payer: Self-pay | Admitting: Pharmacist

## 2022-10-16 NOTE — Progress Notes (Signed)
Received fax from [facility] stating patient's Humira has been changed to interchangeable biosimilar adalimumab-ryvk  Chesley Mires, PharmD, MPH, BCPS, CPP Clinical Pharmacist (Rheumatology and Pulmonology)

## 2022-10-17 ENCOUNTER — Encounter: Payer: Self-pay | Admitting: Primary Care

## 2022-10-17 ENCOUNTER — Ambulatory Visit: Payer: Managed Care, Other (non HMO) | Admitting: Primary Care

## 2022-10-17 VITALS — BP 132/80 | HR 65 | Temp 97.2°F | Ht 59.0 in | Wt 110.0 lb

## 2022-10-17 DIAGNOSIS — J449 Chronic obstructive pulmonary disease, unspecified: Secondary | ICD-10-CM | POA: Diagnosis not present

## 2022-10-17 DIAGNOSIS — Z0000 Encounter for general adult medical examination without abnormal findings: Secondary | ICD-10-CM | POA: Diagnosis not present

## 2022-10-17 DIAGNOSIS — F4323 Adjustment disorder with mixed anxiety and depressed mood: Secondary | ICD-10-CM

## 2022-10-17 DIAGNOSIS — G4700 Insomnia, unspecified: Secondary | ICD-10-CM

## 2022-10-17 DIAGNOSIS — L409 Psoriasis, unspecified: Secondary | ICD-10-CM

## 2022-10-17 DIAGNOSIS — I10 Essential (primary) hypertension: Secondary | ICD-10-CM

## 2022-10-17 DIAGNOSIS — E785 Hyperlipidemia, unspecified: Secondary | ICD-10-CM

## 2022-10-17 DIAGNOSIS — I700 Atherosclerosis of aorta: Secondary | ICD-10-CM | POA: Diagnosis not present

## 2022-10-17 DIAGNOSIS — F17200 Nicotine dependence, unspecified, uncomplicated: Secondary | ICD-10-CM

## 2022-10-17 DIAGNOSIS — I739 Peripheral vascular disease, unspecified: Secondary | ICD-10-CM

## 2022-10-17 NOTE — Assessment & Plan Note (Signed)
Commended her on cutting back cigarette use.  Phone number provided for her to reschedule lung cancer screening scan.

## 2022-10-17 NOTE — Assessment & Plan Note (Signed)
Controlled. ? ?Continue zolpidem 10 mg daily ?

## 2022-10-17 NOTE — Assessment & Plan Note (Signed)
Continue atorvastatin 10 mg daily and clopidogrel 75 mg daily. Repeat lipid panel pending.

## 2022-10-17 NOTE — Progress Notes (Signed)
Subjective:    Patient ID: [name] [name], female    DOB: 21-Aug-1960, 62 y.o.   MRN: [id number]  HPI  [name] [name] is a very pleasant 62 y.o. female who presents today for complete physical and follow up of chronic conditions.  Immunizations: -Tetanus: Completed in 2020 -Influenza: Declines influenza vaccine.  -Shingles: Never completed, declines    Diet: Fair diet.  Exercise: No regular exercise.  Eye exam: Completes monthly  Dental exam: Completed years ago  Pap Smear: Completed years ago, declines  Mammogram: Completed years ago, declines   Colonoscopy: Never completed. Agrees to complete Cologuard.  Lung Cancer Screening: Completed in January 2023.   BP Readings from Last 3 Encounters:  10/17/22 132/80  09/14/22 132/65  11/15/21 134/72         Review of Systems  Constitutional:  Negative for unexpected weight change.  HENT:  Negative for rhinorrhea.   Respiratory:  Negative for cough and shortness of breath.   Cardiovascular:  Negative for chest pain.  Gastrointestinal:  Negative for constipation and diarrhea.  Genitourinary:  Negative for difficulty urinating.  Musculoskeletal:  Negative for arthralgias and myalgias.  Skin:  Positive for rash.  Allergic/Immunologic: Negative for environmental allergies.  Neurological:  Negative for dizziness, numbness and headaches.  Psychiatric/Behavioral:  The patient is not nervous/anxious.          Past Medical History:  Diagnosis Date   Atypical chest pain    Back pain    chronic   Chronic insomnia    Depression    Eye abnormality    trouble seeing out of lt eye   GERD (gastroesophageal reflux disease)    Herpes zoster without complication 02/17/2018   Hot flashes 11/02/2011   Hyperlipidemia    Hypertension    PVD (peripheral vascular disease) (HCC)    segmental femeropoliteal disease   Tobacco abuse     Social History   Socioeconomic History   Marital status: Divorced    Spouse name: Not on  file   Number of children: Not on file   Years of education: Not on file   Highest education level: Not on file  Occupational History   Not on file  Tobacco Use   Smoking status: Every Day    Current packs/day: 0.50    Average packs/day: 0.5 packs/day for 45.0 years (22.5 ttl pk-yrs)    Types: Cigarettes    Passive exposure: Past   Smokeless tobacco: Never  Vaping Use   Vaping status: Never Used  Substance and Sexual Activity   Alcohol use: No   Drug use: Never   Sexual activity: Not on file  Other Topics Concern   Not on file  Social History Narrative   Divorced with 4 children    Current Smoker 1ppd    No alcohol   No illicit drug use      Occupation: Tax Facilities manager Determinants of Corporate investment banker Strain: Not on file  Food Insecurity: Not on file  Transportation Needs: Not on file  Physical Activity: Not on file  Stress: Not on file  Social Connections: Not on file  Intimate Partner Violence: Not on file    Past Surgical History:  Procedure Laterality Date   BUNIONECTOMY  1996   bilateral   CARDIAC CATHETERIZATION N/A 11/01/2014   Procedure: Left Heart Cath and Coronary Angiography;  Surgeon: Laurier Nancy, MD;  Location: [facility];  Service: Cardiovascular;  Laterality: N/A;   CERVICAL DISCECTOMY  06/21/2006    and fusion    CESAREAN SECTION  1990   ENDOMETRIAL ABLATION  June 2010   LOWER EXTREMITY ANGIOGRAPHY Left 08/05/2017   Procedure: LOWER EXTREMITY ANGIOGRAPHY;  Surgeon: Annice Needy, MD;  Location: [facility];  Service: Cardiovascular;  Laterality: Left;   NASAL SINUS SURGERY  1999   TUBAL LIGATION     WRIST SURGERY  2005   right wrist    Family History  Problem Relation Age of Onset   Emphysema Mother        deceased age 67   Alcohol abuse Father        deceased age 67   Psoriasis Sister    Asthma Sister    Healthy Daughter    ADD / ADHD Son    Healthy Son    ADD / ADHD Son    Healthy Son     Healthy Son    Sleep apnea Neg Hx     No Known Allergies  Current Outpatient Medications on File Prior to Visit  Medication Sig Dispense Refill   adalimumab (HUMIRA, 2 PEN,) 40 MG/0.4ML pen Inject 0.4 mLs (40 mg total) into the skin every 14 (fourteen) days. 1 kit - 2 pens 6 each 0   albuterol (VENTOLIN HFA) 108 (90 Base) MCG/ACT inhaler Inhale 2 puffs into the lungs every 6 (six) hours as needed for wheezing or shortness of breath. 1 each 0   amLODipine (NORVASC) 10 MG tablet TAKE 1 TABLET(10 MG) BY MOUTH DAILY FOR BLOOD PRESSURE 90 tablet 3   atorvastatin (LIPITOR) 10 MG tablet TAKE 1 TABLET(10 MG) BY MOUTH DAILY FOR CHOLESTEROL 90 tablet 3   clobetasol cream (TEMOVATE) 0.05 % Apply 1 application topically 2 (two) times daily. 30 g 0   clopidogrel (PLAVIX) 75 MG tablet TAKE 1 TABLET(75 MG) BY MOUTH DAILY 90 tablet 3   fluticasone (FLONASE) 50 MCG/ACT nasal spray Place 1 spray into both nostrils 2 (two) times daily. 16 g 3   Investigational - Study Medication Additional study details: Ophthalmology clinical trial, eye injections     levocetirizine (XYZAL) 5 MG tablet Take 1 tablet (5 mg total) by mouth every evening. For allergies. 90 tablet 1   metoprolol tartrate (LOPRESSOR) 25 MG tablet TAKE 1 TABLET BY MOUTH TWICE A DAY 180 tablet 0   olmesartan (BENICAR) 40 MG tablet TAKE 1 TABLET(40 MG) BY MOUTH DAILY FOR BLOOD PRESSURE 90 tablet 1   PARoxetine (PAXIL) 40 MG tablet TAKE 1 TABLET(40 MG) BY MOUTH DAILY FOR ANXIETY OR DEPRESSION 90 tablet 3   Varenicline Tartrate, Starter, (CHANTIX STARTING MONTH PAK) 0.5 MG X 11 & 1 MG X 42 TBPK Take 1 Package by mouth daily. Take one 0.5 mg tablet by mouth once daily for 3 days, then increase to one 0.5 mg tablet twice daily for 4 days, then increase to one 1 mg tablet twice daily. (Patient not taking: Reported on 09/14/2022) 53 each 0   No current facility-administered medications on file prior to visit.    BP 132/80   Pulse 65   Temp (!) 97.2 F  (36.2 C) (Temporal)   Ht 4\' 11"  (1.499 m)   Wt 110 lb (49.9 kg)   SpO2 98%   BMI 22.22 kg/m  Objective:   Physical Exam HENT:     Right Ear: Tympanic membrane and ear canal normal.     Left Ear: Tympanic membrane and ear canal normal.  Eyes:  Pupils: Pupils are equal, round, and reactive to light.  Cardiovascular:     Rate and Rhythm: Normal rate and regular rhythm.  Pulmonary:     Effort: Pulmonary effort is normal.     Breath sounds: Normal breath sounds.  Abdominal:     General: Bowel sounds are normal.     Palpations: Abdomen is soft.     Tenderness: There is no abdominal tenderness.  Musculoskeletal:        General: Normal range of motion.     Cervical back: Neck supple.  Skin:    General: Skin is warm and dry.     Comments: Plaque like rash to bilateral elbows  Neurological:     Mental Status: She is alert and oriented to person, place, and time.     Cranial Nerves: No cranial nerve deficit.     Deep Tendon Reflexes:     Reflex Scores:      Patellar reflexes are 2+ on the right side and 2+ on the left side. Psychiatric:        Mood and Affect: Mood normal.           Assessment & Plan:  Preventative health care Assessment & Plan: Declines all vaccines. Pap smear overdue, declines despite recommendations Mammogram overdue, she declines despite recommendations Colonoscopy overdue, she will complete Cologuard that was sent to her house last week.  Discussed the importance of a healthy diet and regular exercise in order for weight loss, and to reduce the risk of further co-morbidity.  Exam stable. Labs pending.  Follow up in 1 year for repeat physical.    Psoriasis Assessment & Plan: Following with rheumatology. Continue Humira injections every 2 weeks.  Orders: -     Comprehensive metabolic panel; Future -     CBC with Differential/Platelet; Future  Chronic obstructive pulmonary disease, unspecified COPD type (HCC) Assessment &  Plan: Controlled.  No concerns today. Continue albuterol inhaler PRN for which she uses infrequently.   Atherosclerosis of abdominal aorta (HCC) Assessment & Plan: Continue atorvastatin 10 mg daily and clopidogrel 75 mg daily. Repeat lipid panel pending.   Essential hypertension Assessment & Plan: Controlled.  Continue olmesartan 40 mg daily, metoprolol tartrate 25 mg twice daily, amlodipine 10 mg daily   PAD (peripheral artery disease) (HCC) Assessment & Plan: Stable.  Commended her for cutting back on cigarettes.  Continue clopidogrel 75 mg daily and atorvastatin 10 mg daily.   Adjustment disorder with mixed anxiety and depressed mood Assessment & Plan: Controlled.  Continue paroxetine 40 mg daily.   Hyperlipidemia, unspecified hyperlipidemia type Assessment & Plan: Continue atorvastatin 10 mg daily. Repeat lipid panel pending.  Orders: -     Lipid panel; Future -     Comprehensive metabolic panel; Future  INSOMNIA, CHRONIC Assessment & Plan: Controlled.  Continue zolpidem 10 mg daily.   TOBACCO ABUSE Assessment & Plan: Commended her on cutting back cigarette use.  Phone number provided for her to reschedule lung cancer screening scan.         Doreene Nest, NP

## 2022-10-17 NOTE — Assessment & Plan Note (Signed)
Stable.  Commended her for cutting back on cigarettes.  Continue clopidogrel 75 mg daily and atorvastatin 10 mg daily.

## 2022-10-17 NOTE — Assessment & Plan Note (Signed)
Controlled.  No concerns today. Continue albuterol inhaler PRN for which she uses infrequently.

## 2022-10-17 NOTE — Assessment & Plan Note (Signed)
Controlled.  Continue paroxetine 40 mg daily.

## 2022-10-17 NOTE — Assessment & Plan Note (Signed)
Controlled.  Continue olmesartan 40 mg daily, metoprolol tartrate 25 mg twice daily, amlodipine 10 mg daily

## 2022-10-17 NOTE — Assessment & Plan Note (Signed)
Declines all vaccines. Pap smear overdue, declines despite recommendations Mammogram overdue, she declines despite recommendations Colonoscopy overdue, she will complete Cologuard that was sent to her house last week.  Discussed the importance of a healthy diet and regular exercise in order for weight loss, and to reduce the risk of further co-morbidity.  Exam stable. Labs pending.  Follow up in 1 year for repeat physical.

## 2022-10-17 NOTE — Assessment & Plan Note (Signed)
Continue atorvastatin 10 mg daily. Repeat lipid panel pending.

## 2022-10-17 NOTE — Assessment & Plan Note (Signed)
Following with rheumatology. Continue Humira injections every 2 weeks.

## 2022-10-17 NOTE — Patient Instructions (Addendum)
Please call [facility] to schedule your lung cancer screening test. [phone]   Complete the Cologuard colon cancer screening kit.  Schedule a lab only appointment for mid December.  Please consider a mammogram.  It was a pleasure to see you today!

## 2022-10-18 ENCOUNTER — Other Ambulatory Visit: Payer: Self-pay | Admitting: Primary Care

## 2022-10-18 DIAGNOSIS — F4323 Adjustment disorder with mixed anxiety and depressed mood: Secondary | ICD-10-CM

## 2022-10-18 DIAGNOSIS — E785 Hyperlipidemia, unspecified: Secondary | ICD-10-CM

## 2022-10-18 DIAGNOSIS — I10 Essential (primary) hypertension: Secondary | ICD-10-CM

## 2022-10-31 NOTE — Progress Notes (Deleted)
Office Visit Note  Patient: [name] [name]             Date of Birth: [birth year]           MRN: [id number]             PCP: Doreene Nest, NP Referring: Doreene Nest, NP Visit Date: 11/14/2022 Occupation: @GUAROCC @  Subjective:  No chief complaint on file.   History of Present Illness: [name] [name] is a 62 y.o. female ***     Activities of Daily Living:  Patient reports morning stiffness for *** {minute/hour:19697}.   Patient {ACTIONS;DENIES/REPORTS:21021675::"Denies"} nocturnal pain.  Difficulty dressing/grooming: {ACTIONS;DENIES/REPORTS:21021675::"Denies"} Difficulty climbing stairs: {ACTIONS;DENIES/REPORTS:21021675::"Denies"} Difficulty getting out of chair: {ACTIONS;DENIES/REPORTS:21021675::"Denies"} Difficulty using hands for taps, buttons, cutlery, and/or writing: {ACTIONS;DENIES/REPORTS:21021675::"Denies"}  No Rheumatology ROS completed.   PMFS History:  Patient Active Problem List   Diagnosis Date Noted   Ear fullness, bilateral 03/07/2021   High risk medication use 06/29/2019   Preventative health care 06/12/2018   Psoriasis 04/06/2016   Hyponatremia 11/01/2014   Atherosclerosis of abdominal aorta (HCC) 07/16/2014   Adjustment disorder with mixed anxiety and depressed mood 07/16/2014   Cervical postlaminectomy syndrome 01/10/2012   Chronic fatigue 11/02/2011   Chronic back pain 08/17/2011   COPD (chronic obstructive pulmonary disease) (HCC) 04/21/2010   Left hip pain 01/18/2010   Allergic rhinitis 07/01/2009   STRESS INCONTINENCE 03/16/2008   PAD (peripheral artery disease) (HCC) 07/10/2007   LEG PAIN, BILATERAL 06/24/2007   Hyperlipidemia 01/01/2007   TOBACCO ABUSE 01/01/2007   INSOMNIA, CHRONIC 01/01/2007   RESTLESS LEG SYNDROME 01/01/2007   Essential hypertension 01/01/2007    Past Medical History:  Diagnosis Date   Atypical chest pain    Back pain    chronic   Chronic insomnia    Depression    Eye abnormality    trouble seeing  out of lt eye   GERD (gastroesophageal reflux disease)    Herpes zoster without complication 02/17/2018   Hot flashes 11/02/2011   Hyperlipidemia    Hypertension    PVD (peripheral vascular disease) (HCC)    segmental femeropoliteal disease   Tobacco abuse     Family History  Problem Relation Age of Onset   Emphysema Mother        deceased age 31   Alcohol abuse Father        deceased age 24   Psoriasis Sister    Asthma Sister    Healthy Daughter    ADD / ADHD Son    Healthy Son    ADD / ADHD Son    Healthy Son    Healthy Son    Sleep apnea Neg Hx    Past Surgical History:  Procedure Laterality Date   BUNIONECTOMY  1996   bilateral   CARDIAC CATHETERIZATION N/A 11/01/2014   Procedure: Left Heart Cath and Coronary Angiography;  Surgeon: Laurier Nancy, MD;  Location: [facility];  Service: Cardiovascular;  Laterality: N/A;   CERVICAL DISCECTOMY  06/21/2006    and fusion    CESAREAN SECTION  1990   ENDOMETRIAL ABLATION  June 2010   LOWER EXTREMITY ANGIOGRAPHY Left 08/05/2017   Procedure: LOWER EXTREMITY ANGIOGRAPHY;  Surgeon: Annice Needy, MD;  Location: [facility];  Service: Cardiovascular;  Laterality: Left;   NASAL SINUS SURGERY  1999   TUBAL LIGATION     WRIST SURGERY  2005   right wrist   Social History   Social History Narrative  Divorced with 4 children    Current Smoker 1ppd    No alcohol   No illicit drug use      Occupation: Tax Engineer, site History  Administered Date(s) Administered   Td 06/21/2003   Tdap 06/12/2018     Objective: Vital Signs: There were no vitals taken for this visit.   Physical Exam   Musculoskeletal Exam: ***  CDAI Exam: CDAI Score: -- Patient Global: --; Provider Global: -- Swollen: --; Tender: -- Joint Exam 11/14/2022   No joint exam has been documented for this visit   There is currently no information documented on the homunculus. Go to the Rheumatology activity and complete the  homunculus joint exam.  Investigation: No additional findings.  Imaging: No results found.  Recent Labs: Lab Results  Component Value Date   WBC 11.4 (H) 09/14/2022   HGB 12.3 09/14/2022   PLT 222 09/14/2022   NA 139 09/14/2022   K 4.3 09/14/2022   CL 103 09/14/2022   CO2 26 09/14/2022   GLUCOSE 101 (H) 09/14/2022   BUN 27 (H) 09/14/2022   CREATININE 0.96 09/14/2022   BILITOT 0.4 09/14/2022   ALKPHOS 116 10/11/2021   AST 9 (L) 09/14/2022   ALT 8 09/14/2022   PROT 6.5 09/14/2022   ALBUMIN 4.1 10/11/2021   CALCIUM 9.7 09/14/2022   GFRAA 87 03/22/2020   QFTBGOLDPLUS NEGATIVE 09/14/2022    Speciality Comments: No specialty comments available.  Procedures:  No procedures performed Allergies: Patient has no known allergies.   Assessment / Plan:     Visit Diagnoses: Psoriatic arthropathy (HCC)  Uveitis  Psoriasis  High risk medication use  Sacroiliitis (HCC)  HLA B27 (HLA B27 positive)  Abnormal SPEP  Primary osteoarthritis of both hands  Primary osteoarthritis of both feet  Family history of psoriasis  Cervical postlaminectomy syndrome  Chronic obstructive pulmonary disease, unspecified COPD type (HCC)  History of gastroesophageal reflux (GERD)  Essential hypertension  Smoker  Atherosclerosis of abdominal aorta (HCC)  PAD (peripheral artery disease) (HCC)  History of hyperlipidemia  Adjustment disorder with mixed anxiety and depressed mood  Orders: No orders of the defined types were placed in this encounter.  No orders of the defined types were placed in this encounter.   Face-to-face time spent with patient was *** minutes. Greater than 50% of time was spent in counseling and coordination of care.  Follow-Up Instructions: No follow-ups on file.   Gearldine Bienenstock, PA-C  Note - This record has been created using Dragon software.  Chart creation errors have been sought, but may not always  have been located. Such creation errors do  not reflect on  the standard of medical care.

## 2022-11-13 NOTE — Progress Notes (Deleted)
Office Visit Note  Patient: [name] [name]             Date of Birth: 11/09/1960           MRN: [id number]             PCP: Doreene Nest, NP Referring: Doreene Nest, NP Visit Date: 11/27/2022 Occupation: @GUAROCC @  Subjective:    History of Present Illness: [name] [name] is a 62 y.o. female with history of psoriatic arthritis and uveitis.  Patient remains on humira 40 mg sq injections once every 14 days.     Humira 40 mg sq injections every 14 days. Patent has declined restarting Methotrexate due to excessive fatigue while taking it in the past.   Currently enrolled in a clinical trial-receiving intraocular injections once monthly--under care of Dr. Sherryll Burger.   CBC and CMP updated on 09/14/22.  TB gold negative on 09/14/22.   Activities of Daily Living:  Patient reports morning stiffness for *** {minute/hour:19697}.   Patient {ACTIONS;DENIES/REPORTS:21021675::"Denies"} nocturnal pain.  Difficulty dressing/grooming: {ACTIONS;DENIES/REPORTS:21021675::"Denies"} Difficulty climbing stairs: {ACTIONS;DENIES/REPORTS:21021675::"Denies"} Difficulty getting out of chair: {ACTIONS;DENIES/REPORTS:21021675::"Denies"} Difficulty using hands for taps, buttons, cutlery, and/or writing: {ACTIONS;DENIES/REPORTS:21021675::"Denies"}  No Rheumatology ROS completed.   PMFS History:  Patient Active Problem List   Diagnosis Date Noted   Ear fullness, bilateral 03/07/2021   High risk medication use 06/29/2019   Preventative health care 06/12/2018   Psoriasis 04/06/2016   Hyponatremia 11/01/2014   Atherosclerosis of abdominal aorta (HCC) 07/16/2014   Adjustment disorder with mixed anxiety and depressed mood 07/16/2014   Cervical postlaminectomy syndrome 01/10/2012   Chronic fatigue 11/02/2011   Chronic back pain 08/17/2011   COPD (chronic obstructive pulmonary disease) (HCC) 04/21/2010   Left hip pain 01/18/2010   Allergic rhinitis 07/01/2009   STRESS INCONTINENCE 03/16/2008   PAD  (peripheral artery disease) (HCC) 07/10/2007   LEG PAIN, BILATERAL 06/24/2007   Hyperlipidemia 01/01/2007   TOBACCO ABUSE 01/01/2007   INSOMNIA, CHRONIC 01/01/2007   RESTLESS LEG SYNDROME 01/01/2007   Essential hypertension 01/01/2007    Past Medical History:  Diagnosis Date   Atypical chest pain    Back pain    chronic   Chronic insomnia    Depression    Eye abnormality    trouble seeing out of lt eye   GERD (gastroesophageal reflux disease)    Herpes zoster without complication 02/17/2018   Hot flashes 11/02/2011   Hyperlipidemia    Hypertension    PVD (peripheral vascular disease) (HCC)    segmental femeropoliteal disease   Tobacco abuse     Family History  Problem Relation Age of Onset   Emphysema Mother        deceased age 42   Alcohol abuse Father        deceased age 25   Psoriasis Sister    Asthma Sister    Healthy Daughter    ADD / ADHD Son    Healthy Son    ADD / ADHD Son    Healthy Son    Healthy Son    Sleep apnea Neg Hx    Past Surgical History:  Procedure Laterality Date   BUNIONECTOMY  1996   bilateral   CARDIAC CATHETERIZATION N/A 11/01/2014   Procedure: Left Heart Cath and Coronary Angiography;  Surgeon: Laurier Nancy, MD;  Location: [facility];  Service: Cardiovascular;  Laterality: N/A;   CERVICAL DISCECTOMY  06/21/2006    and fusion    CESAREAN SECTION  1990  ENDOMETRIAL ABLATION  June 2010   LOWER EXTREMITY ANGIOGRAPHY Left 08/05/2017   Procedure: LOWER EXTREMITY ANGIOGRAPHY;  Surgeon: Annice Needy, MD;  Location: [facility];  Service: Cardiovascular;  Laterality: Left;   NASAL SINUS SURGERY  1999   TUBAL LIGATION     WRIST SURGERY  2005   right wrist   Social History   Social History Narrative   Divorced with 4 children    Current Smoker 1ppd    No alcohol   No illicit drug use      Occupation: Tax Engineer, site History  Administered Date(s) Administered   Td 06/21/2003   Tdap 06/12/2018      Objective: Vital Signs: There were no vitals taken for this visit.   Physical Exam Vitals and nursing note reviewed.  Constitutional:      Appearance: She is well-developed.  HENT:     Head: Normocephalic and atraumatic.  Eyes:     Conjunctiva/sclera: Conjunctivae normal.  Cardiovascular:     Rate and Rhythm: Normal rate and regular rhythm.     Heart sounds: Normal heart sounds.  Pulmonary:     Effort: Pulmonary effort is normal.     Breath sounds: Normal breath sounds.  Abdominal:     General: Bowel sounds are normal.     Palpations: Abdomen is soft.  Musculoskeletal:     Cervical back: Normal range of motion.  Lymphadenopathy:     Cervical: No cervical adenopathy.  Skin:    General: Skin is warm and dry.     Capillary Refill: Capillary refill takes less than 2 seconds.  Neurological:     Mental Status: She is alert and oriented to person, place, and time.  Psychiatric:        Behavior: Behavior normal.      Musculoskeletal Exam: ***  CDAI Exam: CDAI Score: -- Patient Global: --; Provider Global: -- Swollen: --; Tender: -- Joint Exam 11/27/2022   No joint exam has been documented for this visit   There is currently no information documented on the homunculus. Go to the Rheumatology activity and complete the homunculus joint exam.  Investigation: No additional findings.  Imaging: No results found.  Recent Labs: Lab Results  Component Value Date   WBC 11.4 (H) 09/14/2022   HGB 12.3 09/14/2022   PLT 222 09/14/2022   NA 139 09/14/2022   K 4.3 09/14/2022   CL 103 09/14/2022   CO2 26 09/14/2022   GLUCOSE 101 (H) 09/14/2022   BUN 27 (H) 09/14/2022   CREATININE 0.96 09/14/2022   BILITOT 0.4 09/14/2022   ALKPHOS 116 10/11/2021   AST 9 (L) 09/14/2022   ALT 8 09/14/2022   PROT 6.5 09/14/2022   ALBUMIN 4.1 10/11/2021   CALCIUM 9.7 09/14/2022   GFRAA 87 03/22/2020   QFTBGOLDPLUS NEGATIVE 09/14/2022    Speciality Comments: No specialty comments  available.  Procedures:  No procedures performed Allergies: Patient has no known allergies.   Assessment / Plan:     Visit Diagnoses: Psoriatic arthropathy (HCC)  Uveitis  Psoriasis  High risk medication use  Sacroiliitis (HCC)  HLA B27 (HLA B27 positive)  Abnormal SPEP  Primary osteoarthritis of both hands  Primary osteoarthritis of both feet  Family history of psoriasis  Cervical postlaminectomy syndrome  Chronic obstructive pulmonary disease, unspecified COPD type (HCC)  History of gastroesophageal reflux (GERD)  Essential hypertension  Smoker  Atherosclerosis of abdominal aorta (HCC)  PAD (peripheral artery disease) (HCC)  History of hyperlipidemia  Adjustment disorder with mixed anxiety and depressed mood  Orders: No orders of the defined types were placed in this encounter.  No orders of the defined types were placed in this encounter.   Face-to-face time spent with patient was *** minutes. Greater than 50% of time was spent in counseling and coordination of care.  Follow-Up Instructions: No follow-ups on file.   Gearldine Bienenstock, PA-C  Note - This record has been created using Dragon software.  Chart creation errors have been sought, but may not always  have been located. Such creation errors do not reflect on  the standard of medical care.

## 2022-11-14 ENCOUNTER — Ambulatory Visit: Payer: Commercial Managed Care - HMO | Admitting: Physician Assistant

## 2022-11-14 DIAGNOSIS — I700 Atherosclerosis of aorta: Secondary | ICD-10-CM

## 2022-11-14 DIAGNOSIS — M961 Postlaminectomy syndrome, not elsewhere classified: Secondary | ICD-10-CM

## 2022-11-14 DIAGNOSIS — F17200 Nicotine dependence, unspecified, uncomplicated: Secondary | ICD-10-CM

## 2022-11-14 DIAGNOSIS — J449 Chronic obstructive pulmonary disease, unspecified: Secondary | ICD-10-CM

## 2022-11-14 DIAGNOSIS — Z840 Family history of diseases of the skin and subcutaneous tissue: Secondary | ICD-10-CM

## 2022-11-14 DIAGNOSIS — Z8639 Personal history of other endocrine, nutritional and metabolic disease: Secondary | ICD-10-CM

## 2022-11-14 DIAGNOSIS — L409 Psoriasis, unspecified: Secondary | ICD-10-CM

## 2022-11-14 DIAGNOSIS — M461 Sacroiliitis, not elsewhere classified: Secondary | ICD-10-CM

## 2022-11-14 DIAGNOSIS — M19071 Primary osteoarthritis, right ankle and foot: Secondary | ICD-10-CM

## 2022-11-14 DIAGNOSIS — I10 Essential (primary) hypertension: Secondary | ICD-10-CM

## 2022-11-14 DIAGNOSIS — H209 Unspecified iridocyclitis: Secondary | ICD-10-CM

## 2022-11-14 DIAGNOSIS — Z8719 Personal history of other diseases of the digestive system: Secondary | ICD-10-CM

## 2022-11-14 DIAGNOSIS — R778 Other specified abnormalities of plasma proteins: Secondary | ICD-10-CM

## 2022-11-14 DIAGNOSIS — I739 Peripheral vascular disease, unspecified: Secondary | ICD-10-CM

## 2022-11-14 DIAGNOSIS — Z79899 Other long term (current) drug therapy: Secondary | ICD-10-CM

## 2022-11-14 DIAGNOSIS — Z1589 Genetic susceptibility to other disease: Secondary | ICD-10-CM

## 2022-11-14 DIAGNOSIS — M19041 Primary osteoarthritis, right hand: Secondary | ICD-10-CM

## 2022-11-14 DIAGNOSIS — F4323 Adjustment disorder with mixed anxiety and depressed mood: Secondary | ICD-10-CM

## 2022-11-14 DIAGNOSIS — L4050 Arthropathic psoriasis, unspecified: Secondary | ICD-10-CM

## 2022-11-15 ENCOUNTER — Other Ambulatory Visit: Payer: Self-pay | Admitting: Primary Care

## 2022-11-15 DIAGNOSIS — I700 Atherosclerosis of aorta: Secondary | ICD-10-CM

## 2022-11-27 ENCOUNTER — Ambulatory Visit: Payer: Self-pay | Admitting: Physician Assistant

## 2022-11-27 DIAGNOSIS — Z1589 Genetic susceptibility to other disease: Secondary | ICD-10-CM

## 2022-11-27 DIAGNOSIS — I739 Peripheral vascular disease, unspecified: Secondary | ICD-10-CM

## 2022-11-27 DIAGNOSIS — I700 Atherosclerosis of aorta: Secondary | ICD-10-CM

## 2022-11-27 DIAGNOSIS — M19071 Primary osteoarthritis, right ankle and foot: Secondary | ICD-10-CM

## 2022-11-27 DIAGNOSIS — M19041 Primary osteoarthritis, right hand: Secondary | ICD-10-CM

## 2022-11-27 DIAGNOSIS — M461 Sacroiliitis, not elsewhere classified: Secondary | ICD-10-CM

## 2022-11-27 DIAGNOSIS — R778 Other specified abnormalities of plasma proteins: Secondary | ICD-10-CM

## 2022-11-27 DIAGNOSIS — Z79899 Other long term (current) drug therapy: Secondary | ICD-10-CM

## 2022-11-27 DIAGNOSIS — L4050 Arthropathic psoriasis, unspecified: Secondary | ICD-10-CM

## 2022-11-27 DIAGNOSIS — J449 Chronic obstructive pulmonary disease, unspecified: Secondary | ICD-10-CM

## 2022-11-27 DIAGNOSIS — Z8719 Personal history of other diseases of the digestive system: Secondary | ICD-10-CM

## 2022-11-27 DIAGNOSIS — Z8639 Personal history of other endocrine, nutritional and metabolic disease: Secondary | ICD-10-CM

## 2022-11-27 DIAGNOSIS — Z840 Family history of diseases of the skin and subcutaneous tissue: Secondary | ICD-10-CM

## 2022-11-27 DIAGNOSIS — L409 Psoriasis, unspecified: Secondary | ICD-10-CM

## 2022-11-27 DIAGNOSIS — M961 Postlaminectomy syndrome, not elsewhere classified: Secondary | ICD-10-CM

## 2022-11-27 DIAGNOSIS — F17200 Nicotine dependence, unspecified, uncomplicated: Secondary | ICD-10-CM

## 2022-11-27 DIAGNOSIS — I10 Essential (primary) hypertension: Secondary | ICD-10-CM

## 2022-11-27 DIAGNOSIS — F4323 Adjustment disorder with mixed anxiety and depressed mood: Secondary | ICD-10-CM

## 2022-11-27 DIAGNOSIS — H209 Unspecified iridocyclitis: Secondary | ICD-10-CM

## 2022-12-15 ENCOUNTER — Other Ambulatory Visit: Payer: Self-pay | Admitting: Primary Care

## 2022-12-15 DIAGNOSIS — G4700 Insomnia, unspecified: Secondary | ICD-10-CM

## 2022-12-15 DIAGNOSIS — I10 Essential (primary) hypertension: Secondary | ICD-10-CM

## 2022-12-15 NOTE — Telephone Encounter (Signed)
Please call patient:  Received refill request for Ambien. Looks like this was taken off her medication list stating that she was no longer taking.   Is she taking?

## 2022-12-17 ENCOUNTER — Telehealth: Payer: Self-pay | Admitting: Primary Care

## 2022-12-17 NOTE — Telephone Encounter (Signed)
Unable to reach patient. Left voicemail to return call to our office.   

## 2022-12-17 NOTE — Telephone Encounter (Signed)
Pt called in and stated that she had a miss called and would like a return call if possible pt can be reached at [phone]

## 2022-12-18 NOTE — Telephone Encounter (Signed)
Called and spoke with patient, she states she has never stopped taking Ambien, she doesn't know why it is listed as not taking.   Patient is in need of refill.

## 2022-12-18 NOTE — Telephone Encounter (Signed)
See refill request encounter for further documentation.

## 2022-12-21 ENCOUNTER — Other Ambulatory Visit: Payer: Self-pay | Admitting: Physician Assistant

## 2023-01-13 ENCOUNTER — Encounter: Payer: Self-pay | Admitting: Acute Care

## 2023-01-13 ENCOUNTER — Other Ambulatory Visit: Payer: Self-pay | Admitting: Primary Care

## 2023-01-13 DIAGNOSIS — I10 Essential (primary) hypertension: Secondary | ICD-10-CM

## 2023-01-25 NOTE — Progress Notes (Unsigned)
Office Visit Note  Patient: [name] [name]             Date of Birth: 1960/04/10           MRN: [id number]             PCP: Doreene Nest, NP Referring: Doreene Nest, NP Visit Date: 01/30/2023 Occupation: @GUAROCC @  Subjective:  Psoriasis   History of Present Illness: [name] [name] is a 63 y.o. female with history of psoriatic arthritis and uveitis. Patient is currently prescribed Humira 40 mg sq injections every 14 days.  Patient reports that she has been out of her prescription for Humira for the past 1 month and will require updated lab work and refills today.  Patient reports that overall since reinitiating Humira after her last office visit she has noticed a 90% improvement in her symptoms.  She has occasional discomfort in her SI joints but has not had any nocturnal pain.  Patient reports that her skin clearance has improved since reinitiating Humira.  Her nail changes have also improved.  Patient continues to have some psoriasis on the plantar aspect of both feet.  She has a prescription for clobetasol and has been using CeraVe ointment topically as needed.   Activities of Daily Living:  Patient reports morning stiffness for 10-15 minutes.   Patient Denies nocturnal pain.  Difficulty dressing/grooming: Denies Difficulty climbing stairs: Denies Difficulty getting out of chair: Denies Difficulty using hands for taps, buttons, cutlery, and/or writing: Denies  Review of Systems  Constitutional:  Positive for fatigue.  HENT:  Positive for mouth dryness. Negative for mouth sores and nose dryness.   Eyes:  Negative for pain and dryness.  Respiratory:  Negative for shortness of breath and difficulty breathing.   Cardiovascular:  Negative for chest pain and palpitations.  Gastrointestinal:  Negative for blood in stool, constipation and diarrhea.  Endocrine: Negative for increased urination.  Genitourinary:  Negative for involuntary urination.  Musculoskeletal:  Positive  for joint pain, joint pain and morning stiffness. Negative for gait problem, joint swelling, myalgias, muscle weakness, muscle tenderness and myalgias.  Skin:  Positive for color change and rash. Negative for hair loss and sensitivity to sunlight.  Allergic/Immunologic: Negative for susceptible to infections.  Neurological:  Negative for dizziness and headaches.  Hematological:  Negative for swollen glands.  Psychiatric/Behavioral:  Positive for depressed mood and sleep disturbance. The patient is nervous/anxious.     PMFS History:  Patient Active Problem List   Diagnosis Date Noted   Ear fullness, bilateral 03/07/2021   High risk medication use 06/29/2019   Preventative health care 06/12/2018   Psoriasis 04/06/2016   Hyponatremia 11/01/2014   Atherosclerosis of abdominal aorta (HCC) 07/16/2014   Adjustment disorder with mixed anxiety and depressed mood 07/16/2014   Cervical postlaminectomy syndrome 01/10/2012   Chronic fatigue 11/02/2011   Chronic back pain 08/17/2011   COPD (chronic obstructive pulmonary disease) (HCC) 04/21/2010   Left hip pain 01/18/2010   Allergic rhinitis 07/01/2009   STRESS INCONTINENCE 03/16/2008   PAD (peripheral artery disease) (HCC) 07/10/2007   LEG PAIN, BILATERAL 06/24/2007   Hyperlipidemia 01/01/2007   TOBACCO ABUSE 01/01/2007   INSOMNIA, CHRONIC 01/01/2007   RESTLESS LEG SYNDROME 01/01/2007   Essential hypertension 01/01/2007    Past Medical History:  Diagnosis Date   Atypical chest pain    Back pain    chronic   Chronic insomnia    Depression    Eye abnormality    trouble seeing  out of lt eye   GERD (gastroesophageal reflux disease)    Herpes zoster without complication 02/17/2018   Hot flashes 11/02/2011   Hyperlipidemia    Hypertension    PVD (peripheral vascular disease) (HCC)    segmental femeropoliteal disease   Tobacco abuse     Family History  Problem Relation Age of Onset   Emphysema Mother        deceased age 26   Alcohol  abuse Father        deceased age 65   Psoriasis Sister    Asthma Sister    Healthy Daughter    ADD / ADHD Son    Healthy Son    ADD / ADHD Son    Healthy Son    Healthy Son    Sleep apnea Neg Hx    Past Surgical History:  Procedure Laterality Date   BUNIONECTOMY  1996   bilateral   CARDIAC CATHETERIZATION N/A 11/01/2014   Procedure: Left Heart Cath and Coronary Angiography;  Surgeon: Laurier Nancy, MD;  Location: [facility];  Service: Cardiovascular;  Laterality: N/A;   CERVICAL DISCECTOMY  06/21/2006    and fusion    CESAREAN SECTION  1990   ENDOMETRIAL ABLATION  June 2010   LOWER EXTREMITY ANGIOGRAPHY Left 08/05/2017   Procedure: LOWER EXTREMITY ANGIOGRAPHY;  Surgeon: Annice Needy, MD;  Location: [facility];  Service: Cardiovascular;  Laterality: Left;   NASAL SINUS SURGERY  1999   TUBAL LIGATION     WRIST SURGERY  2005   right wrist   Social History   Social History Narrative   Divorced with 4 children    Current Smoker 1ppd    No alcohol   No illicit drug use      Occupation: Tax Engineer, site History  Administered Date(s) Administered   Td 06/21/2003   Tdap 06/12/2018     Objective: Vital Signs: BP (!) 166/69 (BP Location: Left Arm, Patient Position: Sitting, Cuff Size: Normal)   Pulse 64   Resp 15   Ht 4\' 11"  (1.499 m)   Wt 110 lb 3.2 oz (50 kg)   BMI 22.26 kg/m    Physical Exam Vitals and nursing note reviewed.  Constitutional:      Appearance: She is well-developed.  HENT:     Head: Normocephalic and atraumatic.  Eyes:     Conjunctiva/sclera: Conjunctivae normal.  Cardiovascular:     Rate and Rhythm: Normal rate and regular rhythm.     Heart sounds: Normal heart sounds.  Pulmonary:     Effort: Pulmonary effort is normal.     Breath sounds: Normal breath sounds.  Abdominal:     General: Bowel sounds are normal.     Palpations: Abdomen is soft.  Musculoskeletal:     Cervical back: Normal range of motion.   Lymphadenopathy:     Cervical: No cervical adenopathy.  Skin:    General: Skin is warm and dry.     Capillary Refill: Capillary refill takes less than 2 seconds.  Neurological:     Mental Status: She is alert and oriented to person, place, and time.  Psychiatric:        Behavior: Behavior normal.      Musculoskeletal Exam: C-spine, thoracic spine, lumbar spine and good range of motion.  No midline spinal tenderness.  No SI joint tenderness.  Shoulder joints, elbow joints, wrist joints, MCPs, PIPs, DIPs have good range of motion  with no synovitis.  PIP and DIP thickening.  Hip joints have good range of motion with no groin pain.  Knee joints have good range of motion no warmth or effusion.  Ankle joints have good range of motion with no tenderness or joint swelling.  No evidence of Achilles tendinitis or plantar fasciitis.  No tenderness or synovitis over MTP joints.  CDAI Exam: CDAI Score: -- Patient Global: --; Provider Global: -- Swollen: --; Tender: -- Joint Exam 01/30/2023   No joint exam has been documented for this visit   There is currently no information documented on the homunculus. Go to the Rheumatology activity and complete the homunculus joint exam.  Investigation: No additional findings.  Imaging: No results found.  Recent Labs: Lab Results  Component Value Date   WBC 11.4 (H) 09/14/2022   HGB 12.3 09/14/2022   PLT 222 09/14/2022   NA 139 09/14/2022   K 4.3 09/14/2022   CL 103 09/14/2022   CO2 26 09/14/2022   GLUCOSE 101 (H) 09/14/2022   BUN 27 (H) 09/14/2022   CREATININE 0.96 09/14/2022   BILITOT 0.4 09/14/2022   ALKPHOS 116 10/11/2021   AST 9 (L) 09/14/2022   ALT 8 09/14/2022   PROT 6.5 09/14/2022   ALBUMIN 4.1 10/11/2021   CALCIUM 9.7 09/14/2022   GFRAA 87 03/22/2020   QFTBGOLDPLUS NEGATIVE 09/14/2022    Speciality Comments: No specialty comments available.  Procedures:  No procedures performed Allergies: Patient has no known allergies.    Assessment / Plan:     Visit Diagnoses: Psoriatic arthropathy (HCC) - History of multiple arthralgias, sacroiliitis, uveitis, psoriasis, HLA-B27 positive, family history of psoriasis: Patient has noticed a 90% improvement in her symptoms since reinitiating Humira after her last office visit.  She has been tolerating Humira 40 mg sq injections every 14 days without any side effects or injection site reactions.  Patient has been out of her prescription for Humira for the past 1 month due to requiring a follow-up visit and updated lab work.  Orders for CBC and CMP were placed today. On examination she had no SI joint tenderness upon palpation.  No evidence of Achilles tendinitis or plantar fasciitis.  She has not had any signs or symptoms of uveitis flare recently.  Her psoriasis and fingernail dystrophy has improved but she continues to have psoriasis on the plantar aspect of both feet, most severe involving the lateral aspect of her right foot.  Patient has a prescription for clobetasol cream at home and has been using CeraVe ointment. A refill of Humira will be sent to the pharmacy today.  Patient was advised to notify us if she develops any new or worsening symptoms.  She will follow-up in the office in 5 months or sooner if needed.  - Plan: adalimumab (HUMIRA, 2 PEN,) 40 MG/0.4ML pen  Uveitis - She remains under the care of Dr. Sherryll Burger.  No recent flares.  Patient has not experiencing eye pain, photophobia, or conjunctival injection at this time.  Patient will remain on Humira as prescribed.  She was advised notify us if she starts to have more frequent flares of uveitis.  Plan: adalimumab (HUMIRA, 2 PEN,) 40 MG/0.4ML pen  Psoriasis - She has noticed a significant improvement in skin clearance as well as an improvement in nail changes since reinitiating Humira.  She continues to have some pustular psoriasis involving both feet, most severe in the right foot.  Patient has a prescription for clobetasol  cream at home and has been  using CeraVe ointment as needed.  Patient would like to remain on Humira as prescribed and is not interested in combination therapy at this time.  She will notify us if she continues to have frequent flares of psoriasis.  Plan: adalimumab (HUMIRA, 2 PEN,) 40 MG/0.4ML pen  High risk medication use - Humira 40 mg sq injections every 14 days. Patent previously declined restarting Methotrexate due to excessive fatigue while taking it in the past.  CBC and CMP updated on 09/14/22. Orders for CBC and CMP released today.  TB gold negative on 09/14/22. Discussed the importance of holding humira if she develops signs or symptoms of an infection and to resume once the infection has completely cleared.   Discussed the importance of yearly skin examinations while on Humira due to the increased risk for nonmelanoma skin cancers.  - Plan: COMPLETE METABOLIC PANEL WITH GFR, CBC with Differential/Platelet, adalimumab (HUMIRA, 2 PEN,) 40 MG/0.4ML pen  Sacroiliitis (HCC): No SI joint tenderness upon palpation today.  She has not had any nocturnal pain.  Her SI joint pain has been significantly more manageable since resuming Humira.  HLA B27 (HLA B27 positive)  Abnormal SPEP - Referred to hematology in 2021  Primary osteoarthritis of both hands: PIP and DIP thickening.  No synovitis noted.  Primary osteoarthritis of both feet: No tenderness or synovitis of MTP joints.  No evidence of Achilles tendinitis or plantar fasciitis at this time.  Family history of psoriasis  Cervical postlaminectomy syndrome  Other medical conditions are listed as follows:   Chronic obstructive pulmonary disease, unspecified COPD type (HCC)  History of gastroesophageal reflux (GERD)  Essential hypertension: BP was 166/69 today in the office.  Patient was advised to monitor blood pressure closely and to reach out to her PCP if it remains elevated.   Smoker  Atherosclerosis of abdominal aorta  (HCC)  PAD (peripheral artery disease) (HCC)  History of hyperlipidemia  Adjustment disorder with mixed anxiety and depressed mood   Orders: Orders Placed This Encounter  Procedures   COMPLETE METABOLIC PANEL WITH GFR   CBC with Differential/Platelet   Meds ordered this encounter  Medications   adalimumab (HUMIRA, 2 PEN,) 40 MG/0.4ML pen    Sig: Inject 0.4 mLs (40 mg total) into the skin every 14 (fourteen) days. 1 kit - 2 pens    Dispense:  6 each    Refill:  0     Follow-Up Instructions: Return in about 5 months (around 06/30/2023) for Psoriatic arthritis.   Gearldine Bienenstock, PA-C  Note - This record has been created using Dragon software.  Chart creation errors have been sought, but may not always  have been located. Such creation errors do not reflect on  the standard of medical care.

## 2023-01-30 ENCOUNTER — Encounter: Payer: Self-pay | Admitting: Physician Assistant

## 2023-01-30 ENCOUNTER — Ambulatory Visit: Payer: Commercial Managed Care - HMO | Attending: Physician Assistant | Admitting: Physician Assistant

## 2023-01-30 VITALS — BP 166/69 | HR 64 | Resp 15 | Ht 59.0 in | Wt 110.2 lb

## 2023-01-30 DIAGNOSIS — Z1589 Genetic susceptibility to other disease: Secondary | ICD-10-CM

## 2023-01-30 DIAGNOSIS — R778 Other specified abnormalities of plasma proteins: Secondary | ICD-10-CM

## 2023-01-30 DIAGNOSIS — H209 Unspecified iridocyclitis: Secondary | ICD-10-CM | POA: Diagnosis not present

## 2023-01-30 DIAGNOSIS — I739 Peripheral vascular disease, unspecified: Secondary | ICD-10-CM

## 2023-01-30 DIAGNOSIS — M19072 Primary osteoarthritis, left ankle and foot: Secondary | ICD-10-CM

## 2023-01-30 DIAGNOSIS — J449 Chronic obstructive pulmonary disease, unspecified: Secondary | ICD-10-CM

## 2023-01-30 DIAGNOSIS — M19041 Primary osteoarthritis, right hand: Secondary | ICD-10-CM

## 2023-01-30 DIAGNOSIS — F4323 Adjustment disorder with mixed anxiety and depressed mood: Secondary | ICD-10-CM

## 2023-01-30 DIAGNOSIS — M461 Sacroiliitis, not elsewhere classified: Secondary | ICD-10-CM

## 2023-01-30 DIAGNOSIS — M961 Postlaminectomy syndrome, not elsewhere classified: Secondary | ICD-10-CM

## 2023-01-30 DIAGNOSIS — L409 Psoriasis, unspecified: Secondary | ICD-10-CM

## 2023-01-30 DIAGNOSIS — Z79899 Other long term (current) drug therapy: Secondary | ICD-10-CM | POA: Diagnosis not present

## 2023-01-30 DIAGNOSIS — L4050 Arthropathic psoriasis, unspecified: Secondary | ICD-10-CM | POA: Diagnosis not present

## 2023-01-30 DIAGNOSIS — I10 Essential (primary) hypertension: Secondary | ICD-10-CM

## 2023-01-30 DIAGNOSIS — M19071 Primary osteoarthritis, right ankle and foot: Secondary | ICD-10-CM

## 2023-01-30 DIAGNOSIS — Z8719 Personal history of other diseases of the digestive system: Secondary | ICD-10-CM

## 2023-01-30 DIAGNOSIS — F17200 Nicotine dependence, unspecified, uncomplicated: Secondary | ICD-10-CM

## 2023-01-30 DIAGNOSIS — M19042 Primary osteoarthritis, left hand: Secondary | ICD-10-CM

## 2023-01-30 DIAGNOSIS — Z840 Family history of diseases of the skin and subcutaneous tissue: Secondary | ICD-10-CM

## 2023-01-30 DIAGNOSIS — I700 Atherosclerosis of aorta: Secondary | ICD-10-CM

## 2023-01-30 DIAGNOSIS — Z8639 Personal history of other endocrine, nutritional and metabolic disease: Secondary | ICD-10-CM

## 2023-01-30 MED ORDER — HUMIRA (2 PEN) 40 MG/0.4ML ~~LOC~~ AJKT: 40.0000 mg | AUTO-INJECTOR | SUBCUTANEOUS | 0 refills | Status: DC

## 2023-01-30 NOTE — Patient Instructions (Signed)
Standing Labs We placed an order today for your standing lab work.   Please have your standing labs drawn in April and every 3 months   Please have your labs drawn 2 weeks prior to your appointment so that the provider can discuss your lab results at your appointment, if possible.  Please note that you may see your imaging and lab results in MyChart before we have reviewed them. We will contact you once all results are reviewed. Please allow our office up to 72 hours to thoroughly review all of the results before contacting the office for clarification of your results.  WALK-IN LAB HOURS  Monday through Thursday from 8:00 am -12:30 pm and 1:00 pm-5:00 pm and Friday from 8:00 am-12:00 pm.  Patients with office visits requiring labs will be seen before walk-in labs.  You may encounter longer than normal wait times. Please allow additional time. Wait times may be shorter on  Monday and Thursday afternoons.  We do not book appointments for walk-in labs. We appreciate your patience and understanding with our staff.   Labs are drawn by [facility]. Please bring your co-pay at the time of your lab draw.  You may receive a bill from [facility] for your lab work.  Please note if you are on Hydroxychloroquine and and an order has been placed for a Hydroxychloroquine level,  you will need to have it drawn 4 hours or more after your last dose.  If you wish to have your labs drawn at another location, please call the office 24 hours in advance so we can fax the orders.  The office is located at [address], [address], [place], [place] [address]   If you have any questions regarding directions or hours of operation,  please call [phone].   As a reminder, please drink plenty of water prior to coming for your lab work. Thanks!

## 2023-01-31 LAB — CBC WITH DIFFERENTIAL/PLATELET
Absolute Lymphocytes: 2280 {cells}/uL (ref 850–3900)
Absolute Monocytes: 570 {cells}/uL (ref 200–950)
Basophils Absolute: 30 {cells}/uL (ref 0–200)
Basophils Relative: 0.4 %
Eosinophils Absolute: 266 {cells}/uL (ref 15–500)
Eosinophils Relative: 3.5 %
HCT: 38.8 % (ref 35.0–45.0)
Hemoglobin: 12.6 g/dL (ref 11.7–15.5)
MCH: 31 pg (ref 27.0–33.0)
MCHC: 32.5 g/dL (ref 32.0–36.0)
MCV: 95.3 fL (ref 80.0–100.0)
MPV: 10 fL (ref 7.5–12.5)
Monocytes Relative: 7.5 %
Neutro Abs: 4454 {cells}/uL (ref 1500–7800)
Neutrophils Relative %: 58.6 %
Platelets: 205 10*3/uL (ref 140–400)
RBC: 4.07 10*6/uL (ref 3.80–5.10)
RDW: 12.7 % (ref 11.0–15.0)
Total Lymphocyte: 30 %
WBC: 7.6 10*3/uL (ref 3.8–10.8)

## 2023-01-31 LAB — COMPLETE METABOLIC PANEL WITH GFR
AG Ratio: 2 (calc) (ref 1.0–2.5)
ALT: 11 U/L (ref 6–29)
AST: 13 U/L (ref 10–35)
Albumin: 4.1 g/dL (ref 3.6–5.1)
Alkaline phosphatase (APISO): 95 U/L (ref 37–153)
BUN: 24 mg/dL (ref 7–25)
CO2: 27 mmol/L (ref 20–32)
Calcium: 9.4 mg/dL (ref 8.6–10.4)
Chloride: 102 mmol/L (ref 98–110)
Creat: 0.92 mg/dL (ref 0.50–1.05)
Globulin: 2.1 g/dL (ref 1.9–3.7)
Glucose, Bld: 90 mg/dL (ref 65–99)
Potassium: 4.4 mmol/L (ref 3.5–5.3)
Sodium: 137 mmol/L (ref 135–146)
Total Bilirubin: 0.5 mg/dL (ref 0.2–1.2)
Total Protein: 6.2 g/dL (ref 6.1–8.1)
eGFR: 70 mL/min/{1.73_m2} (ref >=60)

## 2023-01-31 MED ORDER — SIMLANDI (2 PEN) 40 MG/0.4ML ~~LOC~~ AJKT: 40.0000 mg | AUTO-INJECTOR | SUBCUTANEOUS | 0 refills | Status: DC

## 2023-01-31 NOTE — Progress Notes (Signed)
CBC and CMP WNL

## 2023-01-31 NOTE — Addendum Note (Signed)
Addended by: Murrell Redden on: 01/31/2023 09:12 AM   Modules accepted: Orders

## 2023-02-04 ENCOUNTER — Ambulatory Visit: Payer: Managed Care, Other (non HMO) | Admitting: Dermatology

## 2023-02-13 ENCOUNTER — Other Ambulatory Visit: Payer: Self-pay | Admitting: Primary Care

## 2023-02-13 DIAGNOSIS — I10 Essential (primary) hypertension: Secondary | ICD-10-CM

## 2023-03-19 ENCOUNTER — Other Ambulatory Visit: Payer: Self-pay | Admitting: Primary Care

## 2023-03-19 DIAGNOSIS — G4700 Insomnia, unspecified: Secondary | ICD-10-CM

## 2023-04-11 ENCOUNTER — Other Ambulatory Visit: Payer: Self-pay | Admitting: Primary Care

## 2023-04-11 DIAGNOSIS — F4323 Adjustment disorder with mixed anxiety and depressed mood: Secondary | ICD-10-CM

## 2023-06-03 ENCOUNTER — Other Ambulatory Visit: Payer: Self-pay

## 2023-06-03 DIAGNOSIS — G4700 Insomnia, unspecified: Secondary | ICD-10-CM

## 2023-06-03 MED ORDER — ZOLPIDEM TARTRATE 10 MG PO TABS: 10.0000 mg | ORAL_TABLET | Freq: Every evening | ORAL | 0 refills | Status: DC | PRN

## 2023-06-17 NOTE — Progress Notes (Signed)
 Office Visit Note  Patient: [name] [name]             Date of Birth: Jun 12, 1960           MRN: [id number]             PCP: Gretta Comer POUR, NP Referring: Gretta Comer POUR, NP Visit Date: 07/01/2023 Occupation: @GUAROCC @  Subjective:  Left wrist pain    History of Present Illness: [name] [name] is a 63 y.o. female with a history of psoriatic arthritis and uveitis. Patient remains on Humira  40 mg sq injections every 14 days.  She is tolerating Humira  without any side effects or injection site reactions.  She has not had any recent gaps in therapy.  She denies any recent or recurrent infections.  Patient states for the past 2 months she has been experiencing increased pain and stiffness in the left wrist joint.  She works as a Conservation officer, nature at [employer] which she feels may have exacerbated her symptoms.  She is not experiencing any pain in the right wrist at this time.  She denies any obvious swelling.  She denies any Achilles tendinitis or plantar fasciitis.  She denies any SI joint pain at this time.  Her psoriasis is currently well-controlled.  She denies any signs or symptoms of uveitis flare.   Activities of Daily Living:  Patient reports morning stiffness for 30 minutes.   Patient Reports nocturnal pain.  Difficulty dressing/grooming: Denies Difficulty climbing stairs: Denies Difficulty getting out of chair: Denies Difficulty using hands for taps, buttons, cutlery, and/or writing: Reports  Review of Systems  Constitutional:  Negative for fatigue.  HENT:  Negative for mouth sores and mouth dryness.   Eyes:  Negative for dryness.  Respiratory:  Negative for shortness of breath.   Cardiovascular:  Negative for chest pain and palpitations.  Gastrointestinal:  Negative for blood in stool, constipation and diarrhea.  Endocrine: Negative for increased urination.  Genitourinary:  Negative for involuntary urination.  Musculoskeletal:  Positive for joint pain, joint pain, myalgias, morning  stiffness and myalgias. Negative for gait problem, joint swelling, muscle weakness and muscle tenderness.  Skin:  Negative for color change, rash, hair loss and sensitivity to sunlight.  Allergic/Immunologic: Negative for susceptible to infections.  Neurological:  Negative for dizziness and headaches.  Hematological:  Negative for swollen glands.  Psychiatric/Behavioral:  Negative for depressed mood and sleep disturbance. The patient is not nervous/anxious.     PMFS History:  Patient Active Problem List   Diagnosis Date Noted   Ear fullness, bilateral 03/07/2021   High risk medication use 06/29/2019   Preventative health care 06/12/2018   Psoriasis 04/06/2016   Hyponatremia 11/01/2014   Atherosclerosis of abdominal aorta (HCC) 07/16/2014   Adjustment disorder with mixed anxiety and depressed mood 07/16/2014   Cervical postlaminectomy syndrome 01/10/2012   Chronic fatigue 11/02/2011   Chronic back pain 08/17/2011   COPD (chronic obstructive pulmonary disease) (HCC) 04/21/2010   Left hip pain 01/18/2010   Allergic rhinitis 07/01/2009   STRESS INCONTINENCE 03/16/2008   PAD (peripheral artery disease) (HCC) 07/10/2007   LEG PAIN, BILATERAL 06/24/2007   Hyperlipidemia 01/01/2007   TOBACCO ABUSE 01/01/2007   INSOMNIA, CHRONIC 01/01/2007   RESTLESS LEG SYNDROME 01/01/2007   Essential hypertension 01/01/2007    Past Medical History:  Diagnosis Date   Atypical chest pain    Back pain    chronic   Chronic insomnia    Depression    Eye abnormality  trouble seeing out of lt eye   GERD (gastroesophageal reflux disease)    Herpes zoster without complication 02/17/2018   Hot flashes 11/02/2011   Hyperlipidemia    Hypertension    PVD (peripheral vascular disease) (HCC)    segmental femeropoliteal disease   Tobacco abuse     Family History  Problem Relation Age of Onset   Emphysema Mother        deceased age 51   Alcohol abuse Father        deceased age 75   Psoriasis Sister     Asthma Sister    Healthy Daughter    ADD / ADHD Son    Healthy Son    ADD / ADHD Son    Healthy Son    Healthy Son    Sleep apnea Neg Hx    Past Surgical History:  Procedure Laterality Date   BUNIONECTOMY  1996   bilateral   CARDIAC CATHETERIZATION N/A 11/01/2014   Procedure: Left Heart Cath and Coronary Angiography;  Surgeon: Denyse DELENA Bathe, MD;  Location: [facility];  Service: Cardiovascular;  Laterality: N/A;   CERVICAL DISCECTOMY  06/21/2006    and fusion    CESAREAN SECTION  1990   ENDOMETRIAL ABLATION  June 2010   LOWER EXTREMITY ANGIOGRAPHY Left 08/05/2017   Procedure: LOWER EXTREMITY ANGIOGRAPHY;  Surgeon: Marea Selinda RAMAN, MD;  Location: [facility];  Service: Cardiovascular;  Laterality: Left;   NASAL SINUS SURGERY  1999   TUBAL LIGATION     WRIST SURGERY  2005   right wrist   Social History   Social History Narrative   Divorced with 4 children    Current Smoker 1ppd    No alcohol   No illicit drug use      Occupation: Tax Engineer, site History  Administered Date(s) Administered   Dtap, Unspecified 05/14/1963, 04/15/1964, 05/27/1964   Polio, Unspecified 05/14/1963, 04/15/1964   Smallpox 04/15/1964   Td 06/21/2003   Tdap 06/12/2018     Objective: Vital Signs: BP 116/67 (BP Location: Left Arm, Patient Position: Sitting, Cuff Size: Normal)   Pulse 66   Resp 16   Ht 4' 11 (1.499 m)   Wt 113 lb 9.6 oz (51.5 kg)   BMI 22.94 kg/m    Physical Exam Vitals and nursing note reviewed.  Constitutional:      Appearance: She is well-developed.  HENT:     Head: Normocephalic and atraumatic.   Eyes:     Conjunctiva/sclera: Conjunctivae normal.    Cardiovascular:     Rate and Rhythm: Normal rate and regular rhythm.     Heart sounds: Normal heart sounds.  Pulmonary:     Effort: Pulmonary effort is normal.     Breath sounds: Normal breath sounds.  Abdominal:     General: Bowel sounds are normal.     Palpations: Abdomen is  soft.   Musculoskeletal:     Cervical back: Normal range of motion.   Skin:    General: Skin is warm and dry.     Capillary Refill: Capillary refill takes less than 2 seconds.     Comments: Fingernail pitting noted.  Nail dystrophy noted in the toenails especially on the left foot.   Neurological:     Mental Status: She is alert and oriented to person, place, and time.   Psychiatric:        Behavior: Behavior normal.      Musculoskeletal Exam:  C-spine, thoracic spine, lumbar spine have good range of motion.  No midline spinal tenderness.  Mild SI joint tenderness bilaterally.  Shoulder joints and elbow joints have good range of motion with no tenderness upon palpation.  Limited extension of the left wrist with tenderness and inflammation on the ulnar aspect.  Right wrist has full flexion and extension with no synovitis.  No tenderness or synovitis over MCP joints.  PIP and DIP thickening noted.  Hip joints have good range of motion with no groin pain.  Knee joints have good range of motion no warmth or effusion.  Ankle joints have good range of motion with no tenderness or joint swelling.  No evidence of Achilles tendinitis or plantar fasciitis.  CDAI Exam: CDAI Score: -- Patient Global: --; Provider Global: -- Swollen: --; Tender: -- Joint Exam 07/01/2023   No joint exam has been documented for this visit   There is currently no information documented on the homunculus. Go to the Rheumatology activity and complete the homunculus joint exam.  Investigation: No additional findings.  Imaging: No results found.  Recent Labs: Lab Results  Component Value Date   WBC 7.6 01/30/2023   HGB 12.6 01/30/2023   PLT 205 01/30/2023   NA 137 01/30/2023   K 4.4 01/30/2023   CL 102 01/30/2023   CO2 27 01/30/2023   GLUCOSE 90 01/30/2023   BUN 24 01/30/2023   CREATININE 0.92 01/30/2023   BILITOT 0.5 01/30/2023   ALKPHOS 116 10/11/2021   AST 13 01/30/2023   ALT 11 01/30/2023   PROT  6.2 01/30/2023   ALBUMIN 4.1 10/11/2021   CALCIUM  9.4 01/30/2023   GFRAA 87 03/22/2020   QFTBGOLDPLUS NEGATIVE 09/14/2022    Speciality Comments: No specialty comments available.  Procedures:  No procedures performed Allergies: Patient has no known allergies.   Assessment / Plan:     Visit Diagnoses: Psoriatic arthropathy (HCC) - History of multiple arthralgias, sacroiliitis, uveitis, psoriasis, HLA-B27 positive, family history of psoriasis: Patient presents today with increased pain and stiffness in the left wrist x 2 months.  No injury prior to the onset of symptoms.  Her symptoms are exacerbated by repetitive and overuse activities working as a Conservation officer, nature.  On examination she has slightly limited extension as well as tenderness and inflammation on the ulnar aspect.  Plan to schedule an ultrasound-guided left wrist injection.  She is not experiencing any other joint pain or inflammation at this time.  No evidence of Achilles tendinitis or plantar fasciitis was noted.  She has mild tenderness of both SI joints.  She has had a significant improvement in psoriasis and has less nail dystrophy in her fingernails since reinitiating Humira .  Patient will remain on adalimumab -ryvk as prescribed.  She will notify us  if she develops any new or worsening symptoms.  She will follow-up in the office in 5 months or sooner if needed. - Plan: Adalimumab -ryvk (SIMLANDI , 2 PEN,) 40 MG/0.4ML AJKT  Uveitis - She remains under the care of Dr. Maree.  No recent or recurrent flares of uveitis.  Well-controlled on adalimumab -ryvk.  No medication changes will be made at this time.  - Plan: Adalimumab -ryvk (SIMLANDI , 2 PEN,) 40 MG/0.4ML AJKT  Psoriasis -She has had a significant improvement in skin clearance since reinitiating Humira .  She has not had any recent flares of psoriasis.  She continues to have fingernail pitting as well as nail dystrophy especially in the toenails on the left foot.  She has not been using any  topical agents.  She will remain on adalimumab -ryvk as prescribed.  Plan: Adalimumab -ryvk (SIMLANDI , 2 PEN,) 40 MG/0.4ML AJKT  High risk medication use -Adalimumab -ryvk 40 mg sq injections every 14 days. Patent previously declined restarting Methotrexate  due to excessive fatigue while taking it in the past. - CBC and CMP updated on 01/30/23. Orders for CBC and CMP Released today.  Her next lab work will be due at the end of September and every 3 months to monitor for drug toxicity. TB gold negative on 09/14/22.  Future order for TB gold placed today. No recent or recurrent infections.  Discussed the importance of holding humira  if she develops signs or symptoms of an infection and to resume once the infection has completely cleared.   Plan: CBC with Differential/Platelet, Comprehensive metabolic panel with GFR, Adalimumab -ryvk (SIMLANDI , 2 PEN,) 40 MG/0.4ML AJKT, QuantiFERON-TB Gold Plus  Screening for tuberculosis -Future order for TB gold placed today.  Plan: QuantiFERON-TB Gold Plus  Sacroiliitis [facility]): Mild SI joint tenderness upon palpation today.  No nocturnal pain.   HLA B27 (HLA B27 positive)  Pain in left wrist: She presents today with left wrist pain and stiffness x 2 months.  She has not had any injury or fall prior to the onset of symptoms.  On examination she has limited extension with some tenderness and inflammation on the ulnar aspect of the left wrist.  Different treatment options were discussed today.  She plans on scheduling an ultrasound-guided left wrist injection.   Primary osteoarthritis of both hands: She has PIP and DIP thickening consistent with osteoarthritis of both hands.  Patient presents today with discomfort and stiffness in the left wrist for the past 2 months.  She has not had any injury or fall prior to the onset of symptoms.  She works as a Conservation officer, nature at [employer] and feels that her symptoms are related to the repetitive activity she performs. She has limited extension  of the left wrist on examination today.  Tenderness and inflammation on the ulnar aspect of the left wrist noted.  Different treatment options were discussed today.  She has tried using a brace for support but has had persistent discomfort for the past 2 months.  Plan to schedule ultrasound-guided left wrist injection to help alleviate her symptoms.  Primary osteoarthritis of both feet: She is not experiencing any increased discomfort in her feet at this time.  No evidence of Achilles tendinitis or plantar fasciitis.  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  Chronic obstructive pulmonary disease, unspecified COPD type (HCC)  History of gastroesophageal reflux (GERD)  Essential hypertension: Blood pressure was 116/67 today in the office.  Smoker  Atherosclerosis of abdominal aorta (HCC)  PAD (peripheral artery disease) (HCC)  History of hyperlipidemia  Adjustment disorder with mixed anxiety and depressed mood    Orders: Orders Placed This Encounter  Procedures   CBC with Differential/Platelet   Comprehensive metabolic panel with GFR   QuantiFERON-TB Gold Plus   Meds ordered this encounter  Medications   Adalimumab -ryvk (SIMLANDI , 2 PEN,) 40 MG/0.4ML AJKT    Sig: Inject 40 mg into the skin every 14 (fourteen) days. **dispense biosimilar**    Dispense:  6 each    Refill:  0     Follow-Up Instructions: Return in about 5 months (around 12/01/2023).   Waddell CHRISTELLA Craze, PA-C  Note - This record has been created using Dragon software.  Chart  creation errors have been sought, but may not always  have been located. Such creation errors do not reflect on  the standard of medical care.

## 2023-06-29 ENCOUNTER — Other Ambulatory Visit: Payer: Self-pay | Admitting: Physician Assistant

## 2023-06-29 DIAGNOSIS — L4050 Arthropathic psoriasis, unspecified: Secondary | ICD-10-CM

## 2023-06-29 DIAGNOSIS — Z79899 Other long term (current) drug therapy: Secondary | ICD-10-CM

## 2023-06-29 DIAGNOSIS — H209 Unspecified iridocyclitis: Secondary | ICD-10-CM

## 2023-06-29 DIAGNOSIS — L409 Psoriasis, unspecified: Secondary | ICD-10-CM

## 2023-07-01 ENCOUNTER — Encounter: Payer: Self-pay | Admitting: Physician Assistant

## 2023-07-01 ENCOUNTER — Ambulatory Visit: Payer: Commercial Managed Care - HMO | Attending: Physician Assistant | Admitting: Physician Assistant

## 2023-07-01 VITALS — BP 116/67 | HR 66 | Resp 16 | Ht 59.0 in | Wt 113.6 lb

## 2023-07-01 DIAGNOSIS — M461 Sacroiliitis, not elsewhere classified: Secondary | ICD-10-CM

## 2023-07-01 DIAGNOSIS — M19042 Primary osteoarthritis, left hand: Secondary | ICD-10-CM

## 2023-07-01 DIAGNOSIS — L4050 Arthropathic psoriasis, unspecified: Secondary | ICD-10-CM | POA: Diagnosis not present

## 2023-07-01 DIAGNOSIS — L409 Psoriasis, unspecified: Secondary | ICD-10-CM | POA: Diagnosis not present

## 2023-07-01 DIAGNOSIS — H209 Unspecified iridocyclitis: Secondary | ICD-10-CM

## 2023-07-01 DIAGNOSIS — I739 Peripheral vascular disease, unspecified: Secondary | ICD-10-CM

## 2023-07-01 DIAGNOSIS — F4323 Adjustment disorder with mixed anxiety and depressed mood: Secondary | ICD-10-CM

## 2023-07-01 DIAGNOSIS — M961 Postlaminectomy syndrome, not elsewhere classified: Secondary | ICD-10-CM

## 2023-07-01 DIAGNOSIS — R778 Other specified abnormalities of plasma proteins: Secondary | ICD-10-CM

## 2023-07-01 DIAGNOSIS — F17200 Nicotine dependence, unspecified, uncomplicated: Secondary | ICD-10-CM

## 2023-07-01 DIAGNOSIS — Z840 Family history of diseases of the skin and subcutaneous tissue: Secondary | ICD-10-CM

## 2023-07-01 DIAGNOSIS — Z1589 Genetic susceptibility to other disease: Secondary | ICD-10-CM

## 2023-07-01 DIAGNOSIS — I10 Essential (primary) hypertension: Secondary | ICD-10-CM

## 2023-07-01 DIAGNOSIS — M19072 Primary osteoarthritis, left ankle and foot: Secondary | ICD-10-CM

## 2023-07-01 DIAGNOSIS — M25532 Pain in left wrist: Secondary | ICD-10-CM

## 2023-07-01 DIAGNOSIS — I700 Atherosclerosis of aorta: Secondary | ICD-10-CM

## 2023-07-01 DIAGNOSIS — J449 Chronic obstructive pulmonary disease, unspecified: Secondary | ICD-10-CM

## 2023-07-01 DIAGNOSIS — Z8719 Personal history of other diseases of the digestive system: Secondary | ICD-10-CM

## 2023-07-01 DIAGNOSIS — M19071 Primary osteoarthritis, right ankle and foot: Secondary | ICD-10-CM

## 2023-07-01 DIAGNOSIS — Z79899 Other long term (current) drug therapy: Secondary | ICD-10-CM | POA: Diagnosis not present

## 2023-07-01 DIAGNOSIS — Z8639 Personal history of other endocrine, nutritional and metabolic disease: Secondary | ICD-10-CM

## 2023-07-01 DIAGNOSIS — M19041 Primary osteoarthritis, right hand: Secondary | ICD-10-CM

## 2023-07-01 DIAGNOSIS — Z111 Encounter for screening for respiratory tuberculosis: Secondary | ICD-10-CM

## 2023-07-01 MED ORDER — SIMLANDI (2 PEN) 40 MG/0.4ML ~~LOC~~ AJKT: 40.0000 mg | AUTO-INJECTOR | SUBCUTANEOUS | 0 refills | Status: DC

## 2023-07-01 NOTE — Patient Instructions (Addendum)
 Standing Labs We placed an order today for your standing lab work.   Please have your standing labs drawn at the end of September and every 3 months   Please have your labs drawn 2 weeks prior to your appointment so that the provider can discuss your lab results at your appointment, if possible.  Please note that you may see your imaging and lab results in MyChart before we have reviewed them. We will contact you once all results are reviewed. Please allow our office up to 72 hours to thoroughly review all of the results before contacting the office for clarification of your results.  WALK-IN LAB HOURS  Monday through Thursday from 8:00 am -12:30 pm and 1:00 pm-4:30 pm and Friday from 8:00 am-12:00 pm.  Patients with office visits requiring labs will be seen before walk-in labs.  You may encounter longer than normal wait times. Please allow additional time. Wait times may be shorter on  Monday and Thursday afternoons.  We do not book appointments for walk-in labs. We appreciate your patience and understanding with our staff.   Labs are drawn by [facility]. Please bring your co-pay at the time of your lab draw.  You may receive a bill from [facility] for your lab work.  Please note if you are on Hydroxychloroquine and and an order has been placed for a Hydroxychloroquine level,  you will need to have it drawn 4 hours or more after your last dose.  If you wish to have your labs drawn at another location, please call the office 24 hours in advance so we can fax the orders.  The office is located at [address], [place], [place] [address]   If you have any questions regarding directions or hours of operation,  please call [phone].   As a reminder, please drink plenty of water prior to coming for your lab work. Thanks!

## 2023-07-02 ENCOUNTER — Ambulatory Visit: Payer: Self-pay | Admitting: Physician Assistant

## 2023-07-02 LAB — COMPREHENSIVE METABOLIC PANEL WITH GFR
AG Ratio: 1.6 (calc) (ref 1.0–2.5)
ALT: 13 U/L (ref 6–29)
AST: 12 U/L (ref 10–35)
Albumin: 4.1 g/dL (ref 3.6–5.1)
Alkaline phosphatase (APISO): 96 U/L (ref 37–153)
BUN: 24 mg/dL (ref 7–25)
CO2: 26 mmol/L (ref 20–32)
Calcium: 9.4 mg/dL (ref 8.6–10.4)
Chloride: 104 mmol/L (ref 98–110)
Creat: 1.02 mg/dL (ref 0.50–1.05)
Globulin: 2.5 g/dL (ref 1.9–3.7)
Glucose, Bld: 93 mg/dL (ref 65–99)
Potassium: 4.5 mmol/L (ref 3.5–5.3)
Sodium: 137 mmol/L (ref 135–146)
Total Bilirubin: 0.4 mg/dL (ref 0.2–1.2)
Total Protein: 6.6 g/dL (ref 6.1–8.1)
eGFR: 61 mL/min/{1.73_m2} (ref >=60)

## 2023-07-02 LAB — CBC WITH DIFFERENTIAL/PLATELET
Absolute Lymphocytes: 3312 {cells}/uL (ref 850–3900)
Absolute Monocytes: 702 {cells}/uL (ref 200–950)
Basophils Absolute: 54 {cells}/uL (ref 0–200)
Basophils Relative: 0.6 %
Eosinophils Absolute: 351 {cells}/uL (ref 15–500)
Eosinophils Relative: 3.9 %
HCT: 39.1 % (ref 35.0–45.0)
Hemoglobin: 12.8 g/dL (ref 11.7–15.5)
MCH: 32.3 pg (ref 27.0–33.0)
MCHC: 32.7 g/dL (ref 32.0–36.0)
MCV: 98.7 fL (ref 80.0–100.0)
MPV: 9.9 fL (ref 7.5–12.5)
Monocytes Relative: 7.8 %
Neutro Abs: 4581 {cells}/uL (ref 1500–7800)
Neutrophils Relative %: 50.9 %
Platelets: 213 10*3/uL (ref 140–400)
RBC: 3.96 10*6/uL (ref 3.80–5.10)
RDW: 13.1 % (ref 11.0–15.0)
Total Lymphocyte: 36.8 %
WBC: 9 10*3/uL (ref 3.8–10.8)

## 2023-07-02 NOTE — Progress Notes (Signed)
 CBC and CMP WNL

## 2023-07-03 ENCOUNTER — Ambulatory Visit: Attending: Rheumatology | Admitting: Rheumatology

## 2023-07-03 ENCOUNTER — Ambulatory Visit

## 2023-07-03 ENCOUNTER — Other Ambulatory Visit: Payer: Self-pay | Admitting: Primary Care

## 2023-07-03 DIAGNOSIS — M25532 Pain in left wrist: Secondary | ICD-10-CM | POA: Diagnosis not present

## 2023-07-03 DIAGNOSIS — G4700 Insomnia, unspecified: Secondary | ICD-10-CM

## 2023-07-03 DIAGNOSIS — L4050 Arthropathic psoriasis, unspecified: Secondary | ICD-10-CM

## 2023-07-03 MED ORDER — LIDOCAINE HCL 1 % IJ SOLN
1.0000 mL | INTRAMUSCULAR | Status: AC | PRN
  Administered 2023-07-03: 1 mL

## 2023-07-03 MED ORDER — TRIAMCINOLONE ACETONIDE 40 MG/ML IJ SUSP
30.0000 mg | INTRAMUSCULAR | Status: AC | PRN
  Administered 2023-07-03: 30 mg via INTRA_ARTICULAR

## 2023-07-03 NOTE — Progress Notes (Signed)
   Procedure Note  Patient: [name] [name]             Date of Birth: Aug 12, 1960           MRN: [id number]             Visit Date: 07/03/2023  Procedures: Visit Diagnoses:  1. Pain in left wrist   2. Psoriatic arthropathy (HCC)     Medium Joint Inj: L intercarpal on 07/03/2023 2:31 PM Indications: pain and joint swelling Details: 27 G 1.5 in needle, ultrasound-guided medial approach Medications: 1 mL lidocaine  1 %; 30 mg triamcinolone  acetonide 40 MG/ML Aspirate: 0 mL  Risk of infection, tendon injury, nerve injury, hypopigmentation and dermal atrophy was discussed. Procedure, treatment alternatives, risks and benefits explained, specific risks discussed. Consent was given by the patient. Immediately prior to procedure a time out was called to verify the correct patient, procedure, equipment, support staff and site/side marked as required. Patient was prepped and draped in the usual sterile fashion.     Patient tolerated the procedure well.  Postprocedure instructions were given.  Maya Nash, MD

## 2023-07-04 MED ORDER — ZOLPIDEM TARTRATE 10 MG PO TABS: 10.0000 mg | ORAL_TABLET | Freq: Every evening | ORAL | 0 refills | Status: DC | PRN

## 2023-07-30 DIAGNOSIS — J449 Chronic obstructive pulmonary disease, unspecified: Secondary | ICD-10-CM

## 2023-07-30 MED ORDER — ALBUTEROL SULFATE HFA 108 (90 BASE) MCG/ACT IN AERS: 2.0000 | INHALATION_SPRAY | Freq: Four times a day (QID) | RESPIRATORY_TRACT | 0 refills | Status: AC | PRN

## 2023-08-10 ENCOUNTER — Other Ambulatory Visit: Payer: Self-pay | Admitting: Primary Care

## 2023-08-10 DIAGNOSIS — I700 Atherosclerosis of aorta: Secondary | ICD-10-CM

## 2023-08-11 NOTE — Telephone Encounter (Signed)
Patient is due for CPE/follow up in mid October, this will be required prior to any further refills.  Please schedule, thank you!

## 2023-08-12 ENCOUNTER — Other Ambulatory Visit: Payer: Self-pay | Admitting: Primary Care

## 2023-08-12 DIAGNOSIS — I10 Essential (primary) hypertension: Secondary | ICD-10-CM

## 2023-08-12 NOTE — Telephone Encounter (Signed)
 Lvmtcb, sent mychart message

## 2023-08-12 NOTE — Telephone Encounter (Signed)
 Patient is due for CPE/follow up in mid to late October, this will be required prior to any further refills.  Please schedule, thank you!

## 2023-08-13 NOTE — Telephone Encounter (Signed)
 Lvmtcb

## 2023-08-14 ENCOUNTER — Encounter: Payer: Self-pay | Admitting: Primary Care

## 2023-08-14 NOTE — Telephone Encounter (Signed)
 Lvmtcb. 3rd attempt. Mailed letter

## 2023-09-04 ENCOUNTER — Telehealth: Payer: Self-pay

## 2023-09-04 NOTE — Telephone Encounter (Signed)
 PA renewal initiated automatically by CoverMyMeds.  Submitted a Prior Authorization request to [facility] for HUMIRA  via CoverMyMeds. Will update once we receive a response.   Key: [facility]

## 2023-09-05 NOTE — Telephone Encounter (Signed)
 Received a fax regarding Prior Authorization from [facility] for HUMIRA . Authorization has been DENIED because patient does not have previous trial and failure with biosimilars Cyltezo (cf) and Yuflyma (cf).

## 2023-09-05 NOTE — Telephone Encounter (Signed)
 Okay to switch to biosimilar which is approved.

## 2023-09-05 NOTE — Telephone Encounter (Signed)
 Both are interchangeable biosimilars. We can switch her to [facility].  ATC patient - unable to reach. Left VM  Patient may recently switched to Medicare (?) as well  Sherry Pennant, PharmD, MPH, BCPS, CPP Clinical Pharmacist (Rheumatology and Pulmonology)

## 2023-09-09 NOTE — Telephone Encounter (Signed)
 ATC patient regarding Yuflyma being preferred adalimumab  product. Please start Yuflyma BIV.  There is a patient assistance program for Yuflyma if needed.  I left another VM for patient today to confirm if she has recently changed to Medicare   Sherry Pennant, PharmD, MPH, BCPS, CPP Clinical Pharmacist [facility])

## 2023-09-10 ENCOUNTER — Other Ambulatory Visit (HOSPITAL_COMMUNITY): Payer: Self-pay

## 2023-09-10 ENCOUNTER — Telehealth: Payer: Self-pay

## 2023-09-10 NOTE — Telephone Encounter (Signed)
 Will initiate Biv for Yuflyma in new encounter

## 2023-09-10 NOTE — Telephone Encounter (Signed)
Opened under wrong context.

## 2023-09-10 NOTE — Telephone Encounter (Signed)
 Yuflyma patient assistance application printed. Will reach back out to patient after a few days regarding this.  Sherry Pennant, PharmD, MPH, BCPS, CPP Clinical Pharmacist [facility])

## 2023-09-10 NOTE — Telephone Encounter (Signed)
 Submitted a Prior Authorization request to [facility] for YUFLYMA via CoverMyMeds. Will update once we receive a response.  Key: [id number]

## 2023-09-10 NOTE — Telephone Encounter (Signed)
 Received notification from [facility] regarding a prior authorization for YUFLYMA. Authorization has been APPROVED from 08/27/23 until further notice. Approval letter sent to scan center.  Per test claim, copay for 28 days supply is $1281.93  Authorization # [id number] Phone # [phone]

## 2023-09-11 NOTE — Telephone Encounter (Signed)
 Prescriber form faxed to [facility] for submission once patient form is received

## 2023-09-12 NOTE — Telephone Encounter (Signed)
 Submitted Patient Assistance Application to [employer] for YUFLYMA (ADALIMUMAB -AATY) with patient portion, provider portion, prior authorization approval, and current medication list. Will update patient when we receive a response.  Phone: [phone] Fax: [phone]

## 2023-09-15 ENCOUNTER — Other Ambulatory Visit: Payer: Self-pay | Admitting: Primary Care

## 2023-09-15 DIAGNOSIS — I10 Essential (primary) hypertension: Secondary | ICD-10-CM

## 2023-09-19 NOTE — Telephone Encounter (Signed)
 Received return call. Per rep, they did not process application because they received the pt and provider forms separately. Additionally they are not able to take the application with two different dx codes. Application addedne and refaxed  Phone: [phone] Fax: [phone]   Sherry Pennant, PharmD, MPH, BCPS, CPP Clinical Pharmacist [facility])

## 2023-09-25 ENCOUNTER — Other Ambulatory Visit: Payer: Self-pay | Admitting: Primary Care

## 2023-09-25 DIAGNOSIS — F4323 Adjustment disorder with mixed anxiety and depressed mood: Secondary | ICD-10-CM

## 2023-09-25 DIAGNOSIS — E785 Hyperlipidemia, unspecified: Secondary | ICD-10-CM

## 2023-09-25 DIAGNOSIS — I10 Essential (primary) hypertension: Secondary | ICD-10-CM

## 2023-09-30 NOTE — Telephone Encounter (Signed)
 Called patient assistance program to check on the status of the PAP application. The case manager has tried to reach out to the patient on 9/22 and 9/25 however they could not reach the patient and the patient has not returned their calls. The patient needs to call back to schedule a conference call with the patient assistance program and her insurance to move forward with the PAP application.  Vladislav Axelson C. Tellis Spivak [facility] PharmD Candidate Class of [phone]

## 2023-10-17 NOTE — Telephone Encounter (Signed)
 Called [employer] PAP for update on Yuflyma . Per rep, they are waiting for patient to fax denial for Medicare LIS. They spoke with patient yesterday and gave phone number for LIS to follow-through  Sherry Pennant, PharmD, MPH, BCPS, CPP Clinical Pharmacist [facility])

## 2023-10-22 ENCOUNTER — Other Ambulatory Visit: Payer: Self-pay | Admitting: Medical Genetics

## 2023-10-22 ENCOUNTER — Telehealth: Payer: Self-pay

## 2023-10-22 DIAGNOSIS — Z79899 Other long term (current) drug therapy: Secondary | ICD-10-CM

## 2023-10-22 DIAGNOSIS — H209 Unspecified iridocyclitis: Secondary | ICD-10-CM

## 2023-10-22 DIAGNOSIS — L4050 Arthropathic psoriasis, unspecified: Secondary | ICD-10-CM

## 2023-10-22 DIAGNOSIS — L409 Psoriasis, unspecified: Secondary | ICD-10-CM

## 2023-10-22 NOTE — Telephone Encounter (Signed)
 Conducting BIV for Cyltezo in lieu of Yuflyma as the PAP program for this medication has very strict prerequisites for enrollment.  Submitted a Prior Authorization request to [facility] for CYLTEZO via CoverMyMeds. Will update once we receive a response.  Key: [id number]

## 2023-10-23 ENCOUNTER — Encounter: Payer: Self-pay | Admitting: Primary Care

## 2023-10-23 ENCOUNTER — Ambulatory Visit
Admission: RE | Admit: 2023-10-23 | Discharge: 2023-10-23 | Disposition: A | Source: Ambulatory Visit | Attending: Primary Care

## 2023-10-23 ENCOUNTER — Ambulatory Visit (INDEPENDENT_AMBULATORY_CARE_PROVIDER_SITE_OTHER): Payer: Self-pay | Admitting: Primary Care

## 2023-10-23 VITALS — BP 144/72 | HR 65 | Temp 97.3°F | Ht 59.0 in | Wt 114.0 lb

## 2023-10-23 DIAGNOSIS — J449 Chronic obstructive pulmonary disease, unspecified: Secondary | ICD-10-CM

## 2023-10-23 DIAGNOSIS — F4323 Adjustment disorder with mixed anxiety and depressed mood: Secondary | ICD-10-CM

## 2023-10-23 DIAGNOSIS — I10 Essential (primary) hypertension: Secondary | ICD-10-CM | POA: Diagnosis not present

## 2023-10-23 DIAGNOSIS — R9431 Abnormal electrocardiogram [ECG] [EKG]: Secondary | ICD-10-CM | POA: Diagnosis not present

## 2023-10-23 DIAGNOSIS — L409 Psoriasis, unspecified: Secondary | ICD-10-CM

## 2023-10-23 DIAGNOSIS — I739 Peripheral vascular disease, unspecified: Secondary | ICD-10-CM | POA: Diagnosis not present

## 2023-10-23 DIAGNOSIS — E785 Hyperlipidemia, unspecified: Secondary | ICD-10-CM | POA: Diagnosis not present

## 2023-10-23 DIAGNOSIS — G8929 Other chronic pain: Secondary | ICD-10-CM

## 2023-10-23 DIAGNOSIS — Z122 Encounter for screening for malignant neoplasm of respiratory organs: Secondary | ICD-10-CM

## 2023-10-23 DIAGNOSIS — G4700 Insomnia, unspecified: Secondary | ICD-10-CM

## 2023-10-23 DIAGNOSIS — M25551 Pain in right hip: Secondary | ICD-10-CM

## 2023-10-23 DIAGNOSIS — I452 Bifascicular block: Secondary | ICD-10-CM | POA: Insufficient documentation

## 2023-10-23 DIAGNOSIS — Z0000 Encounter for general adult medical examination without abnormal findings: Secondary | ICD-10-CM | POA: Diagnosis not present

## 2023-10-23 DIAGNOSIS — E2839 Other primary ovarian failure: Secondary | ICD-10-CM

## 2023-10-23 NOTE — Assessment & Plan Note (Signed)
 No concerns today.  Continue albuterol  inhaler as needed

## 2023-10-23 NOTE — Assessment & Plan Note (Signed)
 Repeat lipid panel pending.  Continue atorvastatin 10 mg daily.

## 2023-10-23 NOTE — Assessment & Plan Note (Signed)
 With obvious changes compared to 2019 in 2012. Asymptomatic.   Given her medical history, coupled with EKG changes referral to cardiology placed.  She agrees.

## 2023-10-23 NOTE — Assessment & Plan Note (Signed)
 Continue clopidogrel  75 mg daily and atorvastatin  10 mg daily.

## 2023-10-23 NOTE — Patient Instructions (Signed)
 [facility]  Stop by the lab and x-ray prior to leaving today. I will notify you of your results once received.   It was a pleasure to see you today!

## 2023-10-23 NOTE — Assessment & Plan Note (Signed)
 Controlled.  Continue Ambien  10 mg nightly for which she has taken for years

## 2023-10-23 NOTE — Assessment & Plan Note (Signed)
 Likely osteoarthritis based on HPI today.  Plain films of the right hip ordered and pending. Await results.  Cautioned against recurrent use of NSAIDs

## 2023-10-23 NOTE — Assessment & Plan Note (Signed)
 Follow-up rheumatology.  Working to get back on Humira .

## 2023-10-23 NOTE — Assessment & Plan Note (Signed)
 Controlled. No concerns today.  Continue paroxetine  40 mg daily.

## 2023-10-23 NOTE — Progress Notes (Deleted)
  Cardiology Office Note:  .   Date:  10/23/2023  ID:  Era [name] [name], DOB 1960-06-20, MRN [id number] PCP: Gretta Comer POUR, NP  [facility] Providers Cardiologist:  None { Click to update primary MD,subspecialty MD or APP then REFRESH:1}    No chief complaint on file.   Patient Profile: .     [name] [name] is a *** 63 y.o. female *** with a PMH notable for *** who presents here for *** at the request of Gretta Comer POUR, NP.  {There is no content from the last Narrative History section.}      [name] [name] was last seen on ***  Subjective  Discussed the use of AI scribe software for clinical note transcription with the patient, who gave verbal consent to proceed.  History of Present Illness      Cardiovascular ROS: {roscv:310661}  ROS:  Review of Systems - {ros master:310782}    Objective    Studies Reviewed: .        Results  ECHO: *** CATH: *** MONITOR: *** CT: ***  Risk Assessment/Calculations:   {Does this patient have ATRIAL FIBRILLATION?:[phone]}           Physical Exam:   VS:  There were no vitals taken for this visit.   Wt Readings from Last 3 Encounters:  10/23/23 114 lb (51.7 kg)  07/01/23 113 lb 9.6 oz (51.5 kg)  01/30/23 110 lb 3.2 oz (50 kg)    Physical Exam    GEN: Well nourished, well developed in no acute distress; *** NECK: No JVD; No carotid bruits CARDIAC: Normal S1, S2; RRR, no murmurs, rubs, gallops RESPIRATORY:  Clear to auscultation without rales, wheezing or rhonchi ; nonlabored, good air movement. ABDOMEN: Soft, non-tender, non-distended EXTREMITIES:  No edema; No deformity      ASSESSMENT AND PLAN: .    Problem List Items Addressed This Visit   None   Assessment and Plan Assessment & Plan        {Are you ordering a CV Procedure (e.g. stress test, cath, DCCV, TEE, etc)?   Press F2        :[id number]}   Follow-Up: No follow-ups on file.  I spent *** minutes in the care of Ocean A Carducci  today including {CHL AMB CAR Time Based Billing Options STW (Optional):[phone]::documenting in the encounter.}      Signed, Alm MICAEL Clay, MD, MS Alm Clay, M.D., M.S. Interventional Cardiologist  [facility] Pager # [phone]

## 2023-10-23 NOTE — Assessment & Plan Note (Signed)
 Declines all vaccines. Mammogram overdue, she declines despite recommendations. Colonoscopy overdue, she declines despite recommendations.  She also declines Cologuard. She agrees to lung cancer screening program, referral placed.  Discussed the importance of a healthy diet and regular exercise in order for weight loss, and to reduce the risk of further co-morbidity.  Exam stable. Labs pending.  Follow up in 1 year for repeat physical.

## 2023-10-23 NOTE — Progress Notes (Signed)
 Subjective:    [name] [name] is a 63 y.o. female who presents for a Welcome to Medicare exam.   Cardiac Risk Factors include: none  BP Readings from Last 3 Encounters:  10/23/23 (!) 144/72  07/01/23 116/67  01/30/23 (!) 166/69   She would also like to discuss right hip pain. Chronic to the right for years. Over the last 6 months she's noticed increased frequency of her pain. Her pain is worse when rising from a seated position and walking. She has to use a cart to walk around the store. She's been taking Aleve every day and Tylenol  at night without significant improvement. She underwent xray in 2023 which showed mild symmetric degenerative changes. She tries to remain active.       Objective:    Today's Vitals   10/23/23 1431 10/23/23 1433  BP: (!) 168/82 (!) 144/72  Pulse: 65   Temp: (!) 97.3 F (36.3 C)   TempSrc: Temporal   SpO2: 96%   Weight: 114 lb (51.7 kg)   Height: 4' 11 (1.499 m)   PainSc:  8   Body mass index is 23.03 kg/m.  Medications Outpatient Encounter Medications as of 10/23/2023  Medication Sig   Adalimumab -ryvk (SIMLANDI , 2 PEN,) 40 MG/0.4ML AJKT Inject 40 mg into the skin every 14 (fourteen) days. **dispense biosimilar**   albuterol  (VENTOLIN  HFA) 108 (90 Base) MCG/ACT inhaler Inhale 2 puffs into the lungs every 6 (six) hours as needed for wheezing or shortness of breath.   amLODipine  (NORVASC ) 10 MG tablet TAKE 1 TABLET BY MOUTH EVERY DAY FOR BLOOD PRESSURE   atorvastatin  (LIPITOR) 10 MG tablet TAKE 1 TABLET BY MOUTH DAILY. FOR CHOLESTEROL   clobetasol  cream (TEMOVATE ) 0.05 % Apply 1 application topically 2 (two) times daily.   clopidogrel  (PLAVIX ) 75 MG tablet TAKE 1 TABLET BY MOUTH EVERY DAY   fluticasone  (FLONASE ) 50 MCG/ACT nasal spray Place 1 spray into both nostrils 2 (two) times daily.   metoprolol  tartrate (LOPRESSOR ) 25 MG tablet TAKE 1 TABLET (25 MG TOTAL) BY MOUTH 2 (TWO) TIMES DAILY FOR BLOOD PRESSURE.   olmesartan  (BENICAR ) 40 MG tablet  TAKE 1 TABLET BY MOUTH EVERY DAY FOR BLOOD PRESSURE   PARoxetine  (PAXIL ) 40 MG tablet TAKE 1 TABLET BY MOUTH EVERY DAY FOR ANXIETY AND DEPRESSION   zolpidem  (AMBIEN ) 10 MG tablet Take 1 tablet (10 mg total) by mouth at bedtime as needed. for sleep   [DISCONTINUED] Investigational - Study Medication Additional study details: Ophthalmology clinical trial, eye injections (Patient not taking: Reported on 10/23/2023)   [DISCONTINUED] levocetirizine (XYZAL ) 5 MG tablet Take 1 tablet (5 mg total) by mouth every evening. For allergies. (Patient not taking: Reported on 10/23/2023)   [DISCONTINUED] Varenicline  Tartrate, Starter, (CHANTIX  STARTING MONTH PAK) 0.5 MG X 11 & 1 MG X 42 TBPK Take 1 Package by mouth daily. Take one 0.5 mg tablet by mouth once daily for 3 days, then increase to one 0.5 mg tablet twice daily for 4 days, then increase to one 1 mg tablet twice daily. (Patient not taking: Reported on 10/23/2023)   No facility-administered encounter medications on file as of 10/23/2023.     History: Past Medical History:  Diagnosis Date   Atypical chest pain    Back pain    chronic   Chronic insomnia    Depression    Eye abnormality    trouble seeing out of lt eye   GERD (gastroesophageal reflux disease)    Herpes zoster without complication 02/17/2018  Hot flashes 11/02/2011   Hyperlipidemia    Hypertension    PVD (peripheral vascular disease)    segmental femeropoliteal disease   Tobacco abuse    Past Surgical History:  Procedure Laterality Date   BUNIONECTOMY  1996   bilateral   CARDIAC CATHETERIZATION N/A 11/01/2014   Procedure: Left Heart Cath and Coronary Angiography;  Surgeon: Denyse DELENA Bathe, MD;  Location: [facility] INVASIVE CV LAB;  Service: Cardiovascular;  Laterality: N/A;   CERVICAL DISCECTOMY  06/21/2006    and fusion    CESAREAN SECTION  1990   ENDOMETRIAL ABLATION  June 2010   LOWER EXTREMITY ANGIOGRAPHY Left 08/05/2017   Procedure: LOWER EXTREMITY ANGIOGRAPHY;  Surgeon: Marea Selinda RAMAN, MD;  Location: [facility] INVASIVE CV LAB;  Service: Cardiovascular;  Laterality: Left;   NASAL SINUS SURGERY  1999   TUBAL LIGATION     WRIST SURGERY  2005   right wrist    Family History  Problem Relation Age of Onset   Emphysema Mother        deceased age 65   Alcohol abuse Father        deceased age 58   Psoriasis Sister    Asthma Sister    Healthy Daughter    ADD / ADHD Son    Healthy Son    ADD / ADHD Son    Healthy Son    Healthy Son    Sleep apnea Neg Hx    Social History   Occupational History   Not on file  Tobacco Use   Smoking status: Every Day    Current packs/day: 0.50    Average packs/day: 0.5 packs/day for 45.0 years (22.5 ttl pk-yrs)    Types: Cigarettes    Passive exposure: Past   Smokeless tobacco: Never  Vaping Use   Vaping status: Never Used  Substance and Sexual Activity   Alcohol use: No   Drug use: Never   Sexual activity: Not on file    Tobacco Counseling Ready to quit: Not Answered Counseling given: Not Answered   Immunizations and Health Maintenance Immunization History  Administered Date(s) Administered   Dtap, Unspecified 05/14/1963, 04/15/1964, 05/27/1964   Polio, Unspecified 05/14/1963, 04/15/1964   Smallpox 04/15/1964   Td 06/21/2003   Td (Adult),5 Lf Tetanus Toxid, Preservative Free 07/06/2003   Tdap 06/12/2018   Health Maintenance Due  Topic Date Due   COVID-19 Vaccine (1) Never done   Pneumococcal Vaccine: 50+ Years (1 of 2 - PCV) Never done   Zoster Vaccines- Shingrix (1 of 2) Never done   Fecal DNA (Cologuard)  Never done   Mammogram  03/15/2011   Cervical Cancer Screening (HPV/Pap Cotest)  04/28/2013   Lung Cancer Screening  01/25/2022   DEXA SCAN  Never done    Activities of Daily Living    10/23/2023    2:34 PM  In your present state of health, do you have any difficulty performing the following activities:  Hearing? 0  Vision? 1  Difficulty concentrating or making decisions? 0  Walking or climbing  stairs? 1  Dressing or bathing? 0  Doing errands, shopping? 0  Preparing Food and eating ? N  Using the Toilet? N  In the past six months, have you accidently leaked urine? N  Do you have problems with loss of bowel control? N  Managing your Medications? N  Managing your Finances? N  Housekeeping or managing your Housekeeping? N    Physical Exam   Physical Exam HENT:  Right Ear: Tympanic membrane and ear canal normal.     Left Ear: Tympanic membrane and ear canal normal.  Eyes:     Pupils: Pupils are equal, round, and reactive to light.  Cardiovascular:     Rate and Rhythm: Normal rate and regular rhythm.  Pulmonary:     Effort: Pulmonary effort is normal.     Breath sounds: Normal breath sounds.  Abdominal:     General: Bowel sounds are normal.     Palpations: Abdomen is soft.     Tenderness: There is no abdominal tenderness.  Musculoskeletal:        General: Normal range of motion.     Cervical back: Neck supple.  Skin:    General: Skin is warm and dry.  Neurological:     Mental Status: She is alert and oriented to person, place, and time.     Cranial Nerves: No cranial nerve deficit.     Deep Tendon Reflexes:     Reflex Scores:      Patellar reflexes are 2+ on the right side and 2+ on the left side. Psychiatric:        Mood and Affect: Mood normal.    (optional), or other factors deemed appropriate based on the beneficiary's medical and social history and current clinical standards.   Advanced Directives: Does Patient Have a Medical Advance Directive?: No Would patient like information on creating a medical advance directive?: Yes (ED - Information included in AVS)  EKG: NSR with HR of 63, no PAC/PVC.  She does have new changes including right bundle branch block and inverted T waves.  This is not present on EKGs from 2019 or 2012.      Assessment:    This is a routine wellness examination for this patient .   Vision/Hearing screen Hearing Screening    500Hz  1000Hz  2000Hz  4000Hz   Right ear 20 20 20 20   Left ear 20 20 20 20    Vision Screening   Right eye Left eye Both eyes  Without correction 20/100 20/25 20/30  With correction        Goals   None     Depression Screen    10/23/2023    2:41 PM 10/23/2023    2:39 PM 10/17/2022   11:52 AM 10/11/2021    1:53 PM  PHQ 2/9 Scores  PHQ - 2 Score 0 0 0 0  PHQ- 9 Score    0     Fall Risk    10/23/2023    2:41 PM  Fall Risk   Falls in the past year? 0  Number falls in past yr: 0  Injury with Fall? 0  Risk for fall due to : No Fall Risks  Follow up Falls evaluation completed    Cognitive Function:        10/23/2023    2:41 PM  6CIT Screen  What Year? 0 points  What month? 0 points  What time? 0 points  Count back from 20 0 points  Months in reverse 0 points  Repeat phrase 0 points  Total Score 0 points    Patient Care Team: Gretta Comer POUR, NP as PCP - General (Internal Medicine)     Plan:     I have personally reviewed and noted the following in the patient's chart:   Medical and social history Use of alcohol, tobacco or illicit drugs  Current medications and supplements including opioid prescriptions. Patient is not currently taking opioid prescriptions. Functional ability  and status Nutritional status Physical activity Advanced directives List of other physicians Hospitalizations, surgeries, and ER visits in previous 12 months Vitals Screenings to include cognitive, depression, and falls Referrals and appointments  In addition, I have reviewed and discussed with patient certain preventive protocols, quality metrics, and best practice recommendations. A written personalized care plan for preventive services as well as general preventive health recommendations were provided to patient.  She declines mammogram, colonoscopy, all vaccines.    Taveon Enyeart K Alejandrina Raimer, NP 10/23/2023

## 2023-10-24 ENCOUNTER — Ambulatory Visit: Payer: Self-pay | Admitting: Primary Care

## 2023-10-24 ENCOUNTER — Ambulatory Visit: Admitting: Cardiology

## 2023-10-24 DIAGNOSIS — I700 Atherosclerosis of aorta: Secondary | ICD-10-CM

## 2023-10-24 DIAGNOSIS — I10 Essential (primary) hypertension: Secondary | ICD-10-CM

## 2023-10-24 DIAGNOSIS — I739 Peripheral vascular disease, unspecified: Secondary | ICD-10-CM

## 2023-10-24 DIAGNOSIS — E782 Mixed hyperlipidemia: Secondary | ICD-10-CM

## 2023-10-24 DIAGNOSIS — F17200 Nicotine dependence, unspecified, uncomplicated: Secondary | ICD-10-CM

## 2023-10-24 LAB — COMPREHENSIVE METABOLIC PANEL WITH GFR
ALT: 11 U/L (ref 0–35)
AST: 13 U/L (ref 0–37)
Albumin: 4.5 g/dL (ref 3.5–5.2)
Alkaline Phosphatase: 82 U/L (ref 39–117)
BUN: 14 mg/dL (ref 6–23)
CO2: 27 meq/L (ref 19–32)
Calcium: 9.4 mg/dL (ref 8.4–10.5)
Chloride: 102 meq/L (ref 96–112)
Creatinine, Ser: 0.85 mg/dL (ref 0.40–1.20)
GFR: 71.99 mL/min (ref >60.00)
Glucose, Bld: 96 mg/dL (ref 70–99)
Potassium: 4.6 meq/L (ref 3.5–5.1)
Sodium: 137 meq/L (ref 135–145)
Total Bilirubin: 0.7 mg/dL (ref 0.2–1.2)
Total Protein: 6.7 g/dL (ref 6.0–8.3)

## 2023-10-24 LAB — LIPID PANEL
Cholesterol: 131 mg/dL (ref 0–200)
HDL: 40.4 mg/dL (ref >39.00)
LDL Cholesterol: 69 mg/dL (ref 0–99)
NonHDL: 90.31
Total CHOL/HDL Ratio: 3
Triglycerides: 105 mg/dL (ref 0.0–149.0)
VLDL: 21 mg/dL (ref 0.0–40.0)

## 2023-10-24 NOTE — Telephone Encounter (Signed)
 Received notification from [facility] regarding a prior authorization for CYLTEZO (ADALIMUMAB -ADBM). Authorization has been APPROVED from 10/08/2023 until further notice. Approval letter sent to scan center.  Authorization # [id number]  Prescriber form placed in Dr. Jammie folder for signature  Patient form emailed to [facility] in preparation for sending via [employer] Sherry Pennant, PharmD, MPH, BCPS, CPP Clinical Pharmacist [facility])

## 2023-10-24 NOTE — Telephone Encounter (Signed)
 Application sent to pt via Docu[employer], will await it's completion and return.

## 2023-10-26 ENCOUNTER — Other Ambulatory Visit: Payer: Self-pay | Admitting: Primary Care

## 2023-10-26 DIAGNOSIS — G4700 Insomnia, unspecified: Secondary | ICD-10-CM

## 2023-10-28 NOTE — Telephone Encounter (Signed)
 Signed prescriber form for Cyltezo PAP sent to [employer].

## 2023-10-30 NOTE — Telephone Encounter (Signed)
 [facility] Assistance [facility] CYLTEZO along with patient portion, provider portion, insurance card copy, prior authorization approval, and current medication list. Will update patient when we receive a response.  Phone #: [phone] Fax #: [phone]

## 2023-11-04 MED ORDER — CYLTEZO (2 PEN) 40 MG/0.4ML ~~LOC~~ AJKT: 40.0000 mg | AUTO-INJECTOR | SUBCUTANEOUS | 0 refills | Status: AC

## 2023-11-04 NOTE — Telephone Encounter (Signed)
 Rx for Cyltezo sent to [facility].  Called [employer] for update on Cyltezo application. Per automated system, patient is denied because product is not available through PAP. Transferred to rep who states that patient assistance for Cyltezo is only offered for uninsured patients. Patients with any form of insurance including Medicare do not qualify.  Phone: [phone]  MyChart message sent to patient regarding 2026 Medicare OOP rx max.  Sherry Pennant, PharmD, MPH, BCPS, CPP Clinical Pharmacist [facility])

## 2023-11-06 DIAGNOSIS — G8929 Other chronic pain: Secondary | ICD-10-CM

## 2023-11-06 MED ORDER — PREDNISONE 20 MG PO TABS: ORAL_TABLET | ORAL | 0 refills | Status: DC

## 2023-11-14 ENCOUNTER — Ambulatory Visit: Admitting: Cardiology

## 2023-11-14 NOTE — Progress Notes (Deleted)
 Cardiology Office Note:  .   Date:  11/14/2023  ID:  [name] [name], DOB 1960-11-13, MRN [id number] PCP: Gretta Comer POUR, NP  [facility] Providers Cardiologist:  None { Click to update primary MD,subspecialty MD or APP then REFRESH:1}    No chief complaint on file.   Patient Profile: .     [name] [name] is a *** 63 y.o. female smoker with a PMH notable for *** who presents here for cardiology evaluation due to history of PAD with stenting, tobacco use and hypertension with EKG changes at the request of Gretta Comer POUR, NP.  PMH: PAD: PTCA and stenting of the left common and external iliac.  (2019) Mild nonobstructive CAD by cath in 2016 HTN HLD Smoker Chronic pain     [name] [name] was just seen on 10/23/2023 by Comer Gretta, NP: No symptoms noted.  EKG shows RBBB with T wave inversions in septal and inferior leads.  Right bundle branch block was noted.  Subjective  Discussed the use of AI scribe software for clinical note transcription with the patient, who gave verbal consent to proceed.  History of Present Illness    Cardiovascular ROS: {roscv:310661}  ROS:  Review of Systems - {ros master:310782}    Objective    Studies Reviewed: SABRA    EKG from 2019 showed sinus rhythm with septal Q waves suggestive of anterior infarct, age indeterminant.   Recent EKG from PCP: Sinus rhythm rate 60 bpm with RBBB and repolarization changes (T wave inversions in septal and inferior leads.          Lab Results  Component Value Date   CHOL 131 10/23/2023   HDL 40.40 10/23/2023   LDLCALC 69 10/23/2023   TRIG 105.0 10/23/2023   CHOLHDL 3 10/23/2023   Lab Results  Component Value Date   NA 137 10/23/2023   K 4.6 10/23/2023   CREATININE 0.85 10/23/2023   GFR 71.99 10/23/2023   GLUCOSE 96 10/23/2023   Lab Results  Component Value Date   HGBA1C 5.4 09/27/2020   Results  ECHO: EF 65%.  No RWMA.  G1 DD.  Moderate AI.  Moderate MR.   (11/01/2014) CATH: Mid RCA 30%, distal LAD 40%.  Otherwise normal coronaries.  (Dr. CANDIE Bathe) (11/01/2014) Lower Extremity Arterial Angiogram => > 70% left renal artery.  Normal right renal artery.  Irregular calcific aorta but no stenosis.  Right common iliac<50% stenosis with occluded hypogastric artery.  External iliac with normal;  Left common iliac was heavily diseased and occluded in the midsegment tracking down to the proximal internal iliac with occlusion of the hypogastric.  Extrailiac then normalized leading into the CFA with normal CFA and profunda.  Mild diffuse SFA disease and popliteal artery with no STEMI stenosis.  Two-vessel runoff but with poor inflow. left leg PTA/stent: PTA of left common and external iliac with a DEB, self-expanding stent to distal left common iliac and proximal left external iliac (6 mm x 4 cm stent, covered stent in the left common iliac 6 mm x 37 mm.  Risk Assessment/Calculations:     No BP recorded.  {Refresh Note OR Click here to enter BP  :1}***        Physical Exam:   VS:  There were no vitals taken for this visit.   Wt Readings from Last 3 Encounters:  10/23/23 114 lb (51.7 kg)  07/01/23 113 lb 9.6 oz (51.5 kg)  01/30/23 110 lb 3.2 oz (50 kg)  Physical Exam   GEN: Well nourished, well developed in no acute distress; *** NECK: No JVD; No carotid bruits CARDIAC: Normal S1, S2; RRR, no murmurs, rubs, gallops RESPIRATORY:  Clear to auscultation without rales, wheezing or rhonchi ; nonlabored, good air movement. ABDOMEN: Soft, non-tender, non-distended EXTREMITIES:  No edema; No deformity      ASSESSMENT AND PLAN: .    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 stress test, cath, DCCV, TEE, etc)?   Press F2        :[id number]}   Follow-Up: No follow-ups on file.  I spent *** minutes in the care of [name] [name] today including {CHL AMB CAR Time Based Billing Options STW (Optional):[phone]::documenting in the encounter.}      Signed, Alm MICAEL Clay, MD, MS Alm Clay, M.D., M.S. Interventional Cardiologist  [facility] Pager # [phone]

## 2023-11-18 ENCOUNTER — Telehealth: Payer: Self-pay | Admitting: Rheumatology

## 2023-11-18 NOTE — Telephone Encounter (Signed)
 Patient is requesting a right hip injection with Dr. Dolphus. Pt does has an appointment on 12/11.

## 2023-11-18 NOTE — Telephone Encounter (Signed)
 Patient is now scheduled to see Waddell Craze, PA-C on 11/22/2023.

## 2023-11-18 NOTE — Telephone Encounter (Signed)
 Patient decided to move forward wit Cyltezo copay through insurance. Will await further communication from patient  Sherry Pennant, PharmD, MPH, BCPS, CPP Clinical Pharmacist [facility])

## 2023-11-20 NOTE — Progress Notes (Signed)
 Office Visit Note  Patient: EULALA NEWCOMBE             Date of Birth: 06/26/1960           MRN: [id number]             PCP: Gretta Comer POUR, NP Referring: Gretta Comer POUR, NP Visit Date: 11/22/2023 Occupation: Data Unavailable  Subjective:  Right hip pain   History of Present Illness: Yalonda A Greaser is a 63 y.o. female with history of psoriatic arthritis.   Patient has been off off humira  for the past 2 months due to issues with insurance.  She has been approved start cyltezo  but has not yet received the shipment of medication.  Patient reports that she has been experiencing increased discomfort on the lateral aspect of the right hip for the past 3 months.  Patient states that her symptoms have been constant and are exacerbated by walking for prolonged periods of time.  She has also been experiencing pain when sleeping on her sides at night.  She denies any joint swelling at this time.  She denies any Achilles tendinitis or plantar fasciitis at this time.  Patient states that her SI joint pain has been manageable.  She denies any active psoriasis at this time.  Patient states that she has noticed some increased eye pain and backpacks in her vision.  She has not yet scheduled a follow-up visit with Dr. Maree.   She denies any recent or recurrent infections.      Activities of Daily Living:  Patient reports morning stiffness for 30 minutes.   Patient Reports nocturnal pain.  Difficulty dressing/grooming: Denies Difficulty climbing stairs: Reports Difficulty getting out of chair: Denies Difficulty using hands for taps, buttons, cutlery, and/or writing: Denies  Review of Systems  Constitutional:  Positive for fatigue.  HENT:  Positive for mouth dryness. Negative for mouth sores.   Eyes:  Negative for dryness.  Respiratory:  Negative for shortness of breath.   Cardiovascular:  Negative for chest pain and palpitations.  Gastrointestinal:  Negative for blood in stool, constipation and  diarrhea.  Endocrine: Negative for increased urination.  Genitourinary:  Negative for involuntary urination.  Musculoskeletal:  Positive for joint pain, gait problem, joint pain and morning stiffness. Negative for joint swelling, myalgias, muscle weakness, muscle tenderness and myalgias.  Skin:  Negative for color change, rash, hair loss and sensitivity to sunlight.  Allergic/Immunologic: Negative for susceptible to infections.  Neurological:  Negative for dizziness and headaches.  Hematological:  Negative for swollen glands.  Psychiatric/Behavioral:  Positive for depressed mood and sleep disturbance. The patient is nervous/anxious.     PMFS History:  Patient Active Problem List   Diagnosis Date Noted   Chronic right hip pain 10/23/2023   RBBB (right bundle branch block with left anterior fascicular block) 10/23/2023   Welcome to Medicare preventive visit 10/23/2023   High risk medication use 06/29/2019   Preventative health care 06/12/2018   Psoriasis 04/06/2016   Atherosclerosis of abdominal aorta 07/16/2014   Adjustment disorder with mixed anxiety and depressed mood 07/16/2014   Cervical postlaminectomy syndrome 01/10/2012   Chronic fatigue 11/02/2011   Chronic back pain 08/17/2011   COPD (chronic obstructive pulmonary disease) (HCC) 04/21/2010   Allergic rhinitis 07/01/2009   STRESS INCONTINENCE 03/16/2008   PAD (peripheral artery disease) 07/10/2007   LEG PAIN, BILATERAL 06/24/2007   Hyperlipidemia with target LDL less than 70 01/01/2007   TOBACCO ABUSE 01/01/2007   INSOMNIA, CHRONIC 01/01/2007  RESTLESS LEG SYNDROME 01/01/2007   Essential hypertension 01/01/2007    Past Medical History:  Diagnosis Date   Atypical chest pain    Back pain    chronic   Chronic insomnia    Coronary artery disease    Depression    Eye abnormality    trouble seeing out of lt eye   GERD (gastroesophageal reflux disease)    Herpes zoster without complication 02/17/2018   Hot flashes  11/02/2011   Hyperlipidemia    Hypertension    PVD (peripheral vascular disease)    segmental femeropoliteal disease   Tobacco abuse     Family History  Problem Relation Age of Onset   Emphysema Mother        deceased age 73   Heart disease Father    Alcohol abuse Father        deceased age 30   Psoriasis Sister    Asthma Sister    Healthy Daughter    ADD / ADHD Son    ADD / ADHD Son    Healthy Son    Sleep apnea Neg Hx    Past Surgical History:  Procedure Laterality Date   BUNIONECTOMY  01/01/1994   bilateral   CARDIAC CATHETERIZATION N/A 11/01/2014   Procedure: Left Heart Cath and Coronary Angiography;  Surgeon: Denyse DELENA Bathe, MD;  Location: [facility];  Service: Cardiovascular;  Laterality: N/A;   CERVICAL DISCECTOMY  06/21/2006    and fusion    CESAREAN SECTION  01/02/1988   ENDOMETRIAL ABLATION  06/01/2008   LOWER EXTREMITY ANGIOGRAPHY Left 08/05/2017   Procedure: LOWER EXTREMITY ANGIOGRAPHY;  Surgeon: Marea Selinda RAMAN, MD;  Location: [facility];  Service: Cardiovascular;  Laterality: Left;   NASAL SINUS SURGERY  01/01/1997   TUBAL LIGATION     WRIST SURGERY  01/02/2003   right wrist   Social History   Tobacco Use   Smoking status: Every Day    Current packs/day: 1.50    Average packs/day: 1 pack/day for 95.0 years (97.5 ttl pk-yrs)    Types: Cigarettes    Start date: 11/20/1973    Passive exposure: Past   Smokeless tobacco: Never  Vaping Use   Vaping status: Never Used  Substance Use Topics   Alcohol use: No   Drug use: Never   Social History   Social History Narrative   Divorced with 4 children    Current Smoker 1ppd    No alcohol   No illicit drug use      Occupation: [employer] History  Administered Date(s) Administered   Dtap, Unspecified 05/14/1963, 04/15/1964, 05/27/1964   Polio, Unspecified 05/14/1963, 04/15/1964   Smallpox 04/15/1964   Td 06/21/2003   Td (Adult),5 Lf Tetanus Toxid,  Preservative Free 07/06/2003   Tdap 06/12/2018     Objective: Vital Signs: BP (!) 149/68   Pulse 65   Temp 97.6 F (36.4 C)   Resp 15   Ht 4' 11 (1.499 m)   Wt 114 lb 12.8 oz (52.1 kg)   BMI 23.19 kg/m    Physical Exam Vitals and nursing note reviewed.  Constitutional:      Appearance: She is well-developed.  HENT:     Head: Normocephalic and atraumatic.  Eyes:     Conjunctiva/sclera: Conjunctivae normal.  Cardiovascular:     Rate and Rhythm: Normal rate and regular rhythm.     Heart sounds: Normal heart sounds.  Pulmonary:  Effort: Pulmonary effort is normal.     Breath sounds: Normal breath sounds.  Abdominal:     General: Bowel sounds are normal.     Palpations: Abdomen is soft.  Musculoskeletal:     Cervical back: Normal range of motion.  Lymphadenopathy:     Cervical: No cervical adenopathy.  Skin:    General: Skin is warm and dry.     Capillary Refill: Capillary refill takes less than 2 seconds.  Neurological:     Mental Status: She is alert and oriented to person, place, and time.  Psychiatric:        Behavior: Behavior normal.      Musculoskeletal Exam: C-spine has limited ROM. No midline spinal tenderness. Mild SI joint tenderness bilaterally.  Shoulder joints, elbow joints, wrist joints, MCPs, PIPs, DIPs have good range of motion with no synovitis.  Complete fist formation bilaterally.  Hip joints have good range of motion with no groin pain.  Knee joints have good range of motion no warmth or effusion.  Ankle joints have good range of motion no tenderness or joint swelling.  No evidence of Achilles tendinitis or plantar fasciitis. Tenderness over the right trochanteric bursa.    CDAI Exam: CDAI Score: -- Patient Global: --; Provider Global: -- Swollen: --; Tender: -- Joint Exam 11/22/2023   No joint exam has been documented for this visit   There is currently no information documented on the homunculus. Go to the Rheumatology activity and  complete the homunculus joint exam.  Investigation: No additional findings.  Imaging: DG HIP UNILAT W OR W/O PELVIS 2-3 VIEWS RIGHT Result Date: 10/25/2023 EXAM: 2 OR MORE VIEW(S) XRAY OF THE BILATERAL HIP 10/23/2023 03:21:49 PM COMPARISON: 06/02/2019 CLINICAL HISTORY: chronic right hip pain, increased pain, no trauma. FINDINGS: BONES AND JOINTS: No acute fracture or focal osseous lesion. The hip joint is maintained. Mild bilateral hip degenerative change. SOFT TISSUES: Left iliac stent in place. Tubal ligation clips noted. Vascular calcifications. IMPRESSION: 1. Mild bilateral hip degenerative change. Electronically signed by: Katheleen Faes MD 10/25/2023 03:22 PM EDT RP Workstation: [id number]    Recent Labs: Lab Results  Component Value Date   WBC 9.0 07/01/2023   HGB 12.8 07/01/2023   PLT 213 07/01/2023   NA 137 10/23/2023   K 4.6 10/23/2023   CL 102 10/23/2023   CO2 27 10/23/2023   GLUCOSE 96 10/23/2023   BUN 14 10/23/2023   CREATININE 0.85 10/23/2023   BILITOT 0.7 10/23/2023   ALKPHOS 82 10/23/2023   AST 13 10/23/2023   ALT 11 10/23/2023   PROT 6.7 10/23/2023   ALBUMIN 4.5 10/23/2023   CALCIUM  9.4 10/23/2023   GFRAA 87 03/22/2020   QFTBGOLDPLUS NEGATIVE 09/14/2022    Speciality Comments: No specialty comments available.  Procedures:  Large Joint Inj: R greater trochanter on 11/22/2023 8:35 AM Indications: pain Details: 27 G 1.5 in needle, lateral approach  Arthrogram: No  Medications: 1.5 mL lidocaine  1 %; 40 mg triamcinolone  acetonide 40 MG/ML Aspirate: 0 mL Outcome: tolerated well, no immediate complications Procedure, treatment alternatives, risks and benefits explained, specific risks discussed. Consent was given by the patient. Immediately prior to procedure a time out was called to verify the correct patient, procedure, equipment, support staff and site/side marked as required. Patient was prepped and draped in the usual sterile fashion.     Allergies:  Patient has no known allergies.   Assessment / Plan:     Visit Diagnoses: Psoriatic arthropathy (HCC) - History of multiple arthralgias,  sacroiliitis, uveitis, psoriasis, HLA-B27 positive, family history of psoriasis: She has no synovitis or dactylitis on examination today.  Patient has been off of Humira  for the past 2 months due to issues with insurance.  She will be switching to Cyltezo  and is currently awaiting shipment.  Discussed the importance of remaining on Cyltezo  as prescribed without interruption unless she has an infection at which time she can hold Cyltezo  briefly until symptom resolution. Overall her psoriasis has been well-controlled.  She has no synovitis or dactylitis on examination today.  Mild tenderness of both SI joints noted. She presents today with tenderness on the lateral aspect of the right hip consistent with trochanter bursitis.  Her symptoms started about 3 months ago but have been consistent on a daily basis.  She has been having pain at night when lying on the right side.  She requested a trochanteric bursa cortisone injection today.  She tolerated procedure well.  Procedure was completed above.  Aftercare was discussed.  She was advised to notify us  if her symptoms persist or worsen. She will follow up in 5 months or sooner if needed.   Psoriasis: No  active psoriasis at this time.  Fingernail pitting noted in the right 3rd nail.   Uveitis - Remains under the care of Dr. Hudson patient to schedule an urgent evaluation due to persistent pain and visual changes.  According to the patient she finished this study that she was then in December 2024 but has not followed up with Dr. Loreli since then.  Patient states for the past 1 month she has been seeing black spots and has intermittent pain in the right eye.   No photophobia at this time.  No conjunctival injection noted today.   High risk medication use - Cyltezo  40 mg sq injections every 14 days. Patent previously  declined restarting Methotrexate  due to excessive fatigue.  CMP and lipid panel updated on 10/23/23.   CBC updated on 07/01/23-WNL.  Orders for CBC and CMP released today. Her next lab work will be due in February and every 3 months.  No recent or recurrent infections. Discussed the importance of holding cyltezo  if she develops signs or symptoms of infection and to resume once the infection has completely cleared. - Plan: CBC with Differential/Platelet, QuantiFERON-TB Gold Plus  Screening for tuberculosis -Plan to update TB gold today.   Plan: QuantiFERON-TB Gold Plus  Trochanteric bursitis, right hip -Patient presents today with pain on the lateral aspect of the right hip consistent with right trochanteric bursitis.  She has had persistent discomfort for the past 3 months which has been exacerbated by walking prolonged distances.  She has also had nocturnal pain when laying on her right side at night.  Different treatment options were discussed.  She requested a right trochanteric bursa cortisone injection today.  She tolerated the procedure well.  Procedure note was completed above.  Aftercare was discussed.  She was advised to notify us  if the symptoms persist or worsen.  Plan: Large Joint Inj: R greater trochanter  Pain in left wrist: Resolved--left intercarpal injection on 07/03/23.  No synovitis noted.   Sacroiliitis: Mild SI joint tenderness bilaterally.    HLA B27 (HLA B27 positive)  Primary osteoarthritis of both hands: She has PIP and DIP thickening consistent with osteoarthritis of both hands.  No synovitis noted on exam.  Primary osteoarthritis of both feet: She has good range of motion of both ankle joints with no tenderness or synovitis.  No evidence of Achilles  tendinitis at this time.  Other medical conditions are listed as follows:  Family history of psoriasis  Cervical postlaminectomy syndrome  History of COPD  History of gastroesophageal reflux (GERD)  Essential  hypertension: BP was elevated today in the office and was rechecked prior to leaving.  Advised the patient to monitor blood pressure closely following the cortisone injection today.  Smoker  Atherosclerosis of abdominal aorta  PAD (peripheral artery disease)  History of hyperlipidemia  Adjustment disorder with mixed anxiety and depressed mood  Abnormal SPEP: Previously referred to hematology in 2021  Orders: Orders Placed This Encounter  Procedures   Large Joint Inj: R greater trochanter   CBC with Differential/Platelet   QuantiFERON-TB Gold Plus   No orders of the defined types were placed in this encounter.    Follow-Up Instructions: Return in about 5 months (around 04/21/2024) for Psoriatic arthritis.   Waddell CHRISTELLA Craze, PA-C  Note - This record has been created using Dragon software.  Chart creation errors have been sought, but may not always  have been located. Such creation errors do not reflect on  the standard of medical care.

## 2023-11-21 ENCOUNTER — Ambulatory Visit: Attending: Medical | Admitting: Medical

## 2023-11-21 ENCOUNTER — Encounter: Payer: Self-pay | Admitting: Medical

## 2023-11-21 VITALS — BP 140/60 | HR 66 | Ht 59.0 in | Wt 114.1 lb

## 2023-11-21 DIAGNOSIS — Z720 Tobacco use: Secondary | ICD-10-CM | POA: Insufficient documentation

## 2023-11-21 DIAGNOSIS — I739 Peripheral vascular disease, unspecified: Secondary | ICD-10-CM | POA: Diagnosis present

## 2023-11-21 DIAGNOSIS — I10 Essential (primary) hypertension: Secondary | ICD-10-CM | POA: Diagnosis present

## 2023-11-21 DIAGNOSIS — I5032 Chronic diastolic (congestive) heart failure: Secondary | ICD-10-CM | POA: Diagnosis present

## 2023-11-21 DIAGNOSIS — G4733 Obstructive sleep apnea (adult) (pediatric): Secondary | ICD-10-CM | POA: Insufficient documentation

## 2023-11-21 DIAGNOSIS — E785 Hyperlipidemia, unspecified: Secondary | ICD-10-CM | POA: Diagnosis present

## 2023-11-21 DIAGNOSIS — I452 Bifascicular block: Secondary | ICD-10-CM | POA: Insufficient documentation

## 2023-11-21 DIAGNOSIS — I700 Atherosclerosis of aorta: Secondary | ICD-10-CM | POA: Insufficient documentation

## 2023-11-21 DIAGNOSIS — R0989 Other specified symptoms and signs involving the circulatory and respiratory systems: Secondary | ICD-10-CM | POA: Diagnosis present

## 2023-11-21 NOTE — Progress Notes (Signed)
 Cardiology Office Note   Date:  11/21/2023  ID:  [name] [name], DOB Oct 14, 1960, MRN [id number] PCP: Gretta Comer POUR, NP  [facility] Providers Cardiologist:  New  History of Present Illness [name] [name] is a 63 y.o. female with a h/o psoriatic arthritis, uveitis, COPD, Atherosclerosis of abdominal aorta, HTN, PAD s/p stent LCA and external ilac artery in 2019, OSA, ADD with anxiety/depression, HLD, insomnia, tobacco use who presents as a new patient.   The patient smokes cigarettes, 1/2 ppd. No alcohol or drug history. Father died at 49yo due to hardening of the arteries.   The patient saw Dr. Fernand in 2016.  Left heart cath showed mid RCA lesion of 30% and distal D lesion of 40%.  Echo showed EF of 65%, grade 1 diastolic dysfunction, moderate AI, moderate MR.  The patient underwent lower extremity angiography 08/08/2017 with Dr. Marea due to no pulse on the left side.  She was treated with self-expanding stent placement to the distal left common iliac artery and proximal left external iliac artery.  Since that time she has not followed up with vascular surgery.  Patient underwent sleep study in 2020 that showed obstructive sleep apnea.  Today, the patient presents for abnormal EKG. she was referred by her PCP.  EKG shows new right bundle branch block.  The patient denies any chest pain, shortness of breath, lower leg edema, dizziness, lightheadedness, palpitation, orthopnea, PND.  The patient is on her feet all day at work.  She works at [employer] will, but is going back to [employer].  Patient takes Plavix  and Lipitor daily.  Studies Reviewed EKG Interpretation Date/Time:  Thursday November 21 2023 14:27:30 EST Ventricular Rate:  66 PR Interval:  150 QRS Duration:  124 QT Interval:  418 QTC Calculation: 438 R Axis:   64  Text Interpretation: Normal sinus rhythm Right bundle branch block T wave abnormality, consider inferior ischemia When compared with ECG of 12-Jun-2017 19:55,  Right bundle branch block is now Present Confirmed by Franchester, Kit Mollett ([id number]) on 11/21/2023 2:56:17 PM    Echo 10/2014 Study Conclusions   - Left ventricle: The cavity size was normal. Systolic function was    normal. The estimated ejection fraction was 65%. Wall motion was    normal; there were no regional wall motion abnormalities. Doppler    parameters are consistent with abnormal left ventricular    relaxation (grade 1 diastolic dysfunction).  - Aortic valve: There was moderate regurgitation. Valve area    (Vmax): 2.26 cm^2.  - Mitral valve: There was moderate regurgitation.  - Left atrium: The atrium was mildly dilated.  - Atrial septum: No defect or patent foramen ovale was identified.   Left heart cath 10/2014 Mid RCA lesion, 30% stenosed. Dist LAD lesion, 40% stenosed.   Mild mid LAD disease with normal LVEF. May go home with f/u Thursday at 1 pm.          Physical Exam VS:  BP (!) 140/60 (BP Location: Right Arm, Cuff Size: Normal)   Pulse 66   Ht 4' 11 (1.499 m)   Wt 114 lb 2 oz (51.8 kg)   SpO2 99%   BMI 23.05 kg/m        Wt Readings from Last 3 Encounters:  11/21/23 114 lb 2 oz (51.8 kg)  10/23/23 114 lb (51.7 kg)  07/01/23 113 lb 9.6 oz (51.5 kg)    GEN: Well nourished, well developed in no acute distress NECK: No JVD; + b/l  carotid bruits CARDIAC: RRR, no murmurs, rubs, gallops RESPIRATORY:  Clear to auscultation without rales, wheezing or rhonchi  ABDOMEN: Soft, non-tender, non-distended EXTREMITIES:  No edema; No deformity   ASSESSMENT AND PLAN  Abnormal EKG EKG shows new right bundle branch block.  The patient denies any chest pain or shortness of breath.  I will order an echocardiogram.    Carotid bruit I will order bilateral carotid artery ultrasound.  Continue Plavix  and Lipitor.  Three-vessel CAC Aortic atherosclerosis Chest CT for lung cancer screening in 2023 showed left main and three-vessel CAC with atherosclerotic disease of thoracic  aorta.  Patient denies any anginal symptoms.  Continue Plavix , Lipitor, Lopressor  25 mg twice daily.  PAD s/p stent in 2016 The patient denies any claudication pain in the left side.  She does report right sided hip pain, but is unsure if this is from arthritis.  She will undergo steroid injection.  I will repeat ABIs today.  I recommended follow-up with vascular surgery.  Continue Plavix  and statin therapy.  Hypertension Blood pressure 140/60.  Continue amlodipine  10 mg daily, metoprolol  25 mg twice daily, olmesartan  40 mg daily. We can reassess at follow-up.   Hyperlipidemia LDL 69.  Continue Lipitor 10 mg daily.  Tobacco use Complete cessation recommended.  HFpEF Echo in 2016 showed normal pump function with grade 1 diastolic dysfunction.  The patient is euvolemic on exam.  Continue metoprolol  25 mg twice daily and olmesartan  40 mg daily.  Repeat echo as above.  OSA Need to address at follow-up.      Dispo: Follow-up in 2 months  Signed, Soyla Bainter VEAR Fishman, PA-C

## 2023-11-21 NOTE — Patient Instructions (Signed)
 Medication Instructions:  Your physician recommends that you continue on your current medications as directed. Please refer to the Current Medication list given to you today.   *If you need a refill on your cardiac medications before your next appointment, please call your pharmacy*  Lab Work: No labs ordered today  If you have labs (blood work) drawn today and your tests are completely normal, you will receive your results only by: MyChart Message (if you have MyChart) OR A paper copy in the mail If you have any lab test that is abnormal or we need to change your treatment, we will call you to review the results.  Testing/Procedures: Your physician has requested that you have an echocardiogram. Echocardiography is a painless test that uses sound waves to create images of your heart. It provides your doctor with information about the size and shape of your heart and how well your heart's chambers and valves are working.   You may receive an ultrasound enhancing agent through an IV if needed to better visualize your heart during the echo. This procedure takes approximately one hour.  There are no restrictions for this procedure.  This will take place at [address] [facility] [address]) [address], [place] [address]  Please note: We ask at that you not bring children with you during ultrasound (echo/ vascular) testing. Due to room size and safety concerns, children are not allowed in the ultrasound rooms during exams. Our front office staff cannot provide observation of children in our lobby area while testing is being conducted. An adult accompanying a patient to their appointment will only be allowed in the ultrasound room at the discretion of the ultrasound technician under special circumstances. We apologize for any inconvenience.   Your physician has requested that you have a carotid duplex. This test is an ultrasound of the carotid arteries in your neck. It looks at blood flow through  these arteries that supply the brain with blood.   Allow one hour for this exam.  There are no restrictions or special instructions.  This will take place at [address] [facility] [address]) [address], [place] [address]  Please note: We ask at that you not bring children with you during ultrasound (echo/ vascular) testing. Due to room size and safety concerns, children are not allowed in the ultrasound rooms during exams. Our front office staff cannot provide observation of children in our lobby area while testing is being conducted. An adult accompanying a patient to their appointment will only be allowed in the ultrasound room at the discretion of the ultrasound technician under special circumstances. We apologize for any inconvenience.   Your physician has requested that you have an ankle brachial index (ABI). During this test an ultrasound and blood pressure cuff are used to evaluate the arteries that supply the arms and legs with blood.  Allow thirty minutes for this exam.  There are no restrictions or special instructions.  This will take place at [address] [facility] [address]) [address], [place] [address]  Please note: We ask at that you not bring children with you during ultrasound (echo/ vascular) testing. Due to room size and safety concerns, children are not allowed in the ultrasound rooms during exams. Our front office staff cannot provide observation of children in our lobby area while testing is being conducted. An adult accompanying a patient to their appointment will only be allowed in the ultrasound room at the discretion of the ultrasound technician under special circumstances. We  apologize for any inconvenience.   Your physician has requested that you have a lower extremity arterial duplex. During this test, ultrasound is used to evaluate arterial blood flow in the legs. Allow one hour for this exam. There are no restrictions or special instructions. This will take  place at [address] [facility] [address]) [address], [place] [address]  Please note: We ask at that you not bring children with you during ultrasound (echo/ vascular) testing. Due to room size and safety concerns, children are not allowed in the ultrasound rooms during exams. Our front office staff cannot provide observation of children in our lobby area while testing is being conducted. An adult accompanying a patient to their appointment will only be allowed in the ultrasound room at the discretion of the ultrasound technician under special circumstances. We apologize for any inconvenience.   Follow-Up: At [facility], you and your health needs are our priority.  As part of our continuing mission to provide you with exceptional heart care, our providers are all part of one team.  This team includes your primary Cardiologist (physician) and Advanced Practice Providers or APPs (Physician Assistants and Nurse Practitioners) who all work together to provide you with the care you need, when you need it.  Your next appointment:   2 month(s)  Provider:   Cadence Franchester, PA-C    We recommend signing up for the patient portal called MyChart.  Sign up information is provided on this After Visit Summary.  MyChart is used to connect with patients for Virtual Visits (Telemedicine).  Patients are able to view lab/test results, encounter notes, upcoming appointments, etc.  Non-urgent messages can be sent to your provider as well.   To learn more about what you can do with MyChart, go to [email or web address].

## 2023-11-22 ENCOUNTER — Ambulatory Visit: Attending: Physician Assistant | Admitting: Physician Assistant

## 2023-11-22 ENCOUNTER — Encounter: Payer: Self-pay | Admitting: Physician Assistant

## 2023-11-22 VITALS — BP 149/68 | HR 65 | Temp 97.6°F | Resp 15 | Ht 59.0 in | Wt 114.8 lb

## 2023-11-22 DIAGNOSIS — M19071 Primary osteoarthritis, right ankle and foot: Secondary | ICD-10-CM | POA: Insufficient documentation

## 2023-11-22 DIAGNOSIS — M19072 Primary osteoarthritis, left ankle and foot: Secondary | ICD-10-CM | POA: Diagnosis present

## 2023-11-22 DIAGNOSIS — Z8639 Personal history of other endocrine, nutritional and metabolic disease: Secondary | ICD-10-CM | POA: Diagnosis present

## 2023-11-22 DIAGNOSIS — M25532 Pain in left wrist: Secondary | ICD-10-CM | POA: Insufficient documentation

## 2023-11-22 DIAGNOSIS — L409 Psoriasis, unspecified: Secondary | ICD-10-CM | POA: Insufficient documentation

## 2023-11-22 DIAGNOSIS — H209 Unspecified iridocyclitis: Secondary | ICD-10-CM | POA: Diagnosis present

## 2023-11-22 DIAGNOSIS — M19041 Primary osteoarthritis, right hand: Secondary | ICD-10-CM | POA: Diagnosis present

## 2023-11-22 DIAGNOSIS — Z8709 Personal history of other diseases of the respiratory system: Secondary | ICD-10-CM | POA: Diagnosis present

## 2023-11-22 DIAGNOSIS — I700 Atherosclerosis of aorta: Secondary | ICD-10-CM | POA: Diagnosis present

## 2023-11-22 DIAGNOSIS — F17200 Nicotine dependence, unspecified, uncomplicated: Secondary | ICD-10-CM | POA: Insufficient documentation

## 2023-11-22 DIAGNOSIS — Z8719 Personal history of other diseases of the digestive system: Secondary | ICD-10-CM | POA: Diagnosis present

## 2023-11-22 DIAGNOSIS — Z1589 Genetic susceptibility to other disease: Secondary | ICD-10-CM | POA: Insufficient documentation

## 2023-11-22 DIAGNOSIS — R778 Other specified abnormalities of plasma proteins: Secondary | ICD-10-CM | POA: Insufficient documentation

## 2023-11-22 DIAGNOSIS — F4323 Adjustment disorder with mixed anxiety and depressed mood: Secondary | ICD-10-CM | POA: Insufficient documentation

## 2023-11-22 DIAGNOSIS — Z840 Family history of diseases of the skin and subcutaneous tissue: Secondary | ICD-10-CM | POA: Insufficient documentation

## 2023-11-22 DIAGNOSIS — Z111 Encounter for screening for respiratory tuberculosis: Secondary | ICD-10-CM | POA: Insufficient documentation

## 2023-11-22 DIAGNOSIS — M7061 Trochanteric bursitis, right hip: Secondary | ICD-10-CM | POA: Insufficient documentation

## 2023-11-22 DIAGNOSIS — I10 Essential (primary) hypertension: Secondary | ICD-10-CM | POA: Insufficient documentation

## 2023-11-22 DIAGNOSIS — Z79899 Other long term (current) drug therapy: Secondary | ICD-10-CM | POA: Diagnosis present

## 2023-11-22 DIAGNOSIS — L4050 Arthropathic psoriasis, unspecified: Secondary | ICD-10-CM | POA: Insufficient documentation

## 2023-11-22 DIAGNOSIS — I739 Peripheral vascular disease, unspecified: Secondary | ICD-10-CM | POA: Insufficient documentation

## 2023-11-22 DIAGNOSIS — M961 Postlaminectomy syndrome, not elsewhere classified: Secondary | ICD-10-CM | POA: Insufficient documentation

## 2023-11-22 DIAGNOSIS — M19042 Primary osteoarthritis, left hand: Secondary | ICD-10-CM | POA: Diagnosis present

## 2023-11-22 DIAGNOSIS — M461 Sacroiliitis, not elsewhere classified: Secondary | ICD-10-CM | POA: Diagnosis present

## 2023-11-22 MED ORDER — TRIAMCINOLONE ACETONIDE 40 MG/ML IJ SUSP
40.0000 mg | INTRAMUSCULAR | Status: AC | PRN
  Administered 2023-11-22: 40 mg via INTRA_ARTICULAR

## 2023-11-22 MED ORDER — LIDOCAINE HCL 1 % IJ SOLN
1.5000 mL | INTRAMUSCULAR | Status: AC | PRN
  Administered 2023-11-22: 1.5 mL

## 2023-11-24 ENCOUNTER — Ambulatory Visit: Payer: Self-pay | Admitting: Physician Assistant

## 2023-11-24 NOTE — Progress Notes (Signed)
 WBC count is elevated-absolute monocytes are elevated, rest of CBC stable.

## 2023-11-25 LAB — CBC WITH DIFFERENTIAL/PLATELET
Absolute Lymphocytes: 3158 {cells}/uL (ref 850–3900)
Absolute Monocytes: 974 {cells}/uL — ABNORMAL HIGH (ref 200–950)
Basophils Absolute: 45 {cells}/uL (ref 0–200)
Basophils Relative: 0.4 %
Eosinophils Absolute: 302 {cells}/uL (ref 15–500)
Eosinophils Relative: 2.7 %
HCT: 38 % (ref 35.0–45.0)
Hemoglobin: 12.6 g/dL (ref 11.7–15.5)
MCH: 33.2 pg — ABNORMAL HIGH (ref 27.0–33.0)
MCHC: 33.2 g/dL (ref 32.0–36.0)
MCV: 100 fL (ref 80.0–100.0)
MPV: 10.1 fL (ref 7.5–12.5)
Monocytes Relative: 8.7 %
Neutro Abs: 6720 {cells}/uL (ref 1500–7800)
Neutrophils Relative %: 60 %
Platelets: 206 Thousand/uL (ref 140–400)
RBC: 3.8 Million/uL (ref 3.80–5.10)
RDW: 12.4 % (ref 11.0–15.0)
Total Lymphocyte: 28.2 %
WBC: 11.2 Thousand/uL — ABNORMAL HIGH (ref 3.8–10.8)

## 2023-11-25 LAB — QUANTIFERON-TB GOLD PLUS
Mitogen-NIL: 9.49 [IU]/mL
NIL: 0.02 [IU]/mL
QuantiFERON-TB Gold Plus: NEGATIVE
TB1-NIL: 0.01 [IU]/mL
TB2-NIL: 0.02 [IU]/mL

## 2023-11-25 NOTE — Progress Notes (Signed)
 TB gold negative

## 2023-11-28 ENCOUNTER — Other Ambulatory Visit: Payer: Self-pay | Admitting: Primary Care

## 2023-11-28 DIAGNOSIS — I700 Atherosclerosis of aorta: Secondary | ICD-10-CM

## 2023-11-28 DIAGNOSIS — I10 Essential (primary) hypertension: Secondary | ICD-10-CM

## 2023-12-09 ENCOUNTER — Other Ambulatory Visit: Payer: Self-pay | Admitting: Primary Care

## 2023-12-09 DIAGNOSIS — I10 Essential (primary) hypertension: Secondary | ICD-10-CM

## 2023-12-12 ENCOUNTER — Ambulatory Visit: Admitting: Rheumatology

## 2023-12-21 ENCOUNTER — Other Ambulatory Visit: Payer: Self-pay | Admitting: Primary Care

## 2023-12-21 DIAGNOSIS — I10 Essential (primary) hypertension: Secondary | ICD-10-CM

## 2023-12-22 ENCOUNTER — Other Ambulatory Visit: Payer: Self-pay | Admitting: Primary Care

## 2023-12-22 DIAGNOSIS — E785 Hyperlipidemia, unspecified: Secondary | ICD-10-CM

## 2023-12-24 ENCOUNTER — Other Ambulatory Visit: Payer: Self-pay | Admitting: Primary Care

## 2023-12-24 DIAGNOSIS — F4323 Adjustment disorder with mixed anxiety and depressed mood: Secondary | ICD-10-CM

## 2023-12-30 ENCOUNTER — Ambulatory Visit: Attending: Medical

## 2023-12-30 ENCOUNTER — Ambulatory Visit

## 2023-12-30 DIAGNOSIS — E785 Hyperlipidemia, unspecified: Secondary | ICD-10-CM

## 2023-12-30 DIAGNOSIS — R0989 Other specified symptoms and signs involving the circulatory and respiratory systems: Secondary | ICD-10-CM | POA: Diagnosis not present

## 2023-12-30 DIAGNOSIS — I700 Atherosclerosis of aorta: Secondary | ICD-10-CM | POA: Diagnosis not present

## 2023-12-30 DIAGNOSIS — I10 Essential (primary) hypertension: Secondary | ICD-10-CM

## 2023-12-30 DIAGNOSIS — I739 Peripheral vascular disease, unspecified: Secondary | ICD-10-CM

## 2023-12-30 DIAGNOSIS — I452 Bifascicular block: Secondary | ICD-10-CM | POA: Diagnosis not present

## 2023-12-30 LAB — ECHOCARDIOGRAM COMPLETE
AR max vel: 2.23 cm2
AV Area VTI: 2.14 cm2
AV Area mean vel: 2.17 cm2
AV Mean grad: 5 mmHg
AV Peak grad: 8.9 mmHg
Ao pk vel: 1.49 m/s
Area-P 1/2: 4.49 cm2
Calc EF: 57.4 %
P 1/2 time: 387 ms
S' Lateral: 3.4 cm
Single Plane A2C EF: 57.7 %
Single Plane A4C EF: 57.5 %

## 2023-12-30 LAB — VAS US ABI WITH/WO TBI
Left ABI: 0.95
Right ABI: 0.87

## 2023-12-31 ENCOUNTER — Ambulatory Visit: Payer: Self-pay | Admitting: Medical

## 2024-01-06 ENCOUNTER — Telehealth: Payer: Self-pay | Admitting: Medical

## 2024-01-06 DIAGNOSIS — M79606 Pain in leg, unspecified: Secondary | ICD-10-CM

## 2024-01-06 DIAGNOSIS — I739 Peripheral vascular disease, unspecified: Secondary | ICD-10-CM

## 2024-01-06 NOTE — Telephone Encounter (Signed)
 Pt returning call from last week regarding Echo results. Please advise

## 2024-01-15 ENCOUNTER — Ambulatory Visit: Attending: Cardiovascular Disease | Admitting: Cardiovascular Disease

## 2024-01-15 ENCOUNTER — Encounter: Payer: Self-pay | Admitting: Cardiovascular Disease

## 2024-01-15 VITALS — BP 120/50 | HR 70 | Ht 59.0 in | Wt 111.5 lb

## 2024-01-15 DIAGNOSIS — Z720 Tobacco use: Secondary | ICD-10-CM | POA: Insufficient documentation

## 2024-01-15 DIAGNOSIS — I6529 Occlusion and stenosis of unspecified carotid artery: Secondary | ICD-10-CM | POA: Insufficient documentation

## 2024-01-15 DIAGNOSIS — I2510 Atherosclerotic heart disease of native coronary artery without angina pectoris: Secondary | ICD-10-CM | POA: Insufficient documentation

## 2024-01-15 DIAGNOSIS — E785 Hyperlipidemia, unspecified: Secondary | ICD-10-CM | POA: Diagnosis present

## 2024-01-15 DIAGNOSIS — I739 Peripheral vascular disease, unspecified: Secondary | ICD-10-CM | POA: Insufficient documentation

## 2024-01-15 DIAGNOSIS — I10 Essential (primary) hypertension: Secondary | ICD-10-CM | POA: Diagnosis not present

## 2024-01-15 MED ORDER — ATORVASTATIN CALCIUM 40 MG PO TABS: 40.0000 mg | ORAL_TABLET | Freq: Every day | ORAL | 3 refills | Status: AC

## 2024-01-15 NOTE — Progress Notes (Signed)
 "    Cardiology Office Note   Date:  01/15/2024   ID:  [name] [name], DOB March 14, 1960, MRN [id number]  PCP:  Gretta Comer POUR, NP  Cardiologist:   Deatrice Cage, MD   Chief Complaint  Patient presents with   Follow-up    F/u testing c/o hip/groin pain. Meds reviewed verbally with pt.      History of Present Illness: [name] [name] is a 64 y.o. female who was referred for evaluation management of peripheral arterial disease. She has past medical history of Psoriatec arthritis, COPD, essential hypertension, obstructive sleep apnea, anxiety/depression, hyperlipidemia and tobacco use. She had prior cardiac catheterization in 2016 which showed mild nonobstructive coronary artery disease. She has known history of peripheral arterial disease and claudication and was treated with drug-coated balloon angioplasty of the left external iliac artery with covered stent placement to the left common iliac artery performed by Dr. Marea in 2019. She underwent recent lower extremity arterial Doppler studies which showed an ABI of 0.87 on the right and 0.95 on the left.  Duplex showed borderline significant right common iliac artery stenosis with no obstructive infrainguinal disease.  On the left, there was moderate diffuse disease affecting the iliac arteries. Recent carotid Doppler showed moderate right carotid stenosis.  She reports right hip and groin discomfort.  In addition, she has occasional cramping in the right calf.  No symptoms on the left side.  No lower extremity ulceration.  She cut down on tobacco use to half a pack per day.  Past Medical History:  Diagnosis Date   Atypical chest pain    Back pain    chronic   Chronic insomnia    Coronary artery disease    Depression    Eye abnormality    trouble seeing out of lt eye   GERD (gastroesophageal reflux disease)    Herpes zoster without complication 02/17/2018   Hot flashes 11/02/2011   Hyperlipidemia    Hypertension    PVD (peripheral  vascular disease)    segmental femeropoliteal disease   Tobacco abuse     Past Surgical History:  Procedure Laterality Date   BUNIONECTOMY  01/01/1994   bilateral   CARDIAC CATHETERIZATION N/A 11/01/2014   Procedure: Left Heart Cath and Coronary Angiography;  Surgeon: Denyse DELENA Bathe, MD;  Location: [facility] INVASIVE CV LAB;  Service: Cardiovascular;  Laterality: N/A;   CERVICAL DISCECTOMY  06/21/2006    and fusion    CESAREAN SECTION  01/02/1988   ENDOMETRIAL ABLATION  06/01/2008   LOWER EXTREMITY ANGIOGRAPHY Left 08/05/2017   Procedure: LOWER EXTREMITY ANGIOGRAPHY;  Surgeon: Marea Selinda RAMAN, MD;  Location: [facility] INVASIVE CV LAB;  Service: Cardiovascular;  Laterality: Left;   NASAL SINUS SURGERY  01/01/1997   TUBAL LIGATION     WRIST SURGERY  01/02/2003   right wrist     Current Outpatient Medications  Medication Sig Dispense Refill   albuterol  (VENTOLIN  HFA) 108 (90 Base) MCG/ACT inhaler Inhale 2 puffs into the lungs every 6 (six) hours as needed for wheezing or shortness of breath. 1 each 0   amLODipine  (NORVASC ) 10 MG tablet TAKE 1 TABLET BY MOUTH EVERY DAY FOR BLOOD PRESSURE 90 tablet 2   atorvastatin  (LIPITOR) 10 MG tablet TAKE 1 TABLET BY MOUTH EVERY DAY FOR CHOLESTEROL 90 tablet 2   clopidogrel  (PLAVIX ) 75 MG tablet TAKE 1 TABLET BY MOUTH EVERY DAY 90 tablet 2   fluticasone  (FLONASE ) 50 MCG/ACT nasal spray Place 1 spray into both nostrils 2 (two)  times daily. 16 g 3   metoprolol  tartrate (LOPRESSOR ) 25 MG tablet TAKE 1 TABLET (25 MG TOTAL) BY MOUTH 2 (TWO) TIMES DAILY FOR BLOOD PRESSURE. 180 tablet 2   olmesartan  (BENICAR ) 40 MG tablet TAKE 1 TABLET BY MOUTH EVERY DAY FOR BLOOD PRESSURE 90 tablet 2   PARoxetine  (PAXIL ) 40 MG tablet TAKE 1 TABLET BY MOUTH EVERY DAY FOR ANXIETY AND DEPRESSION 90 tablet 2   zolpidem  (AMBIEN ) 10 MG tablet TAKE 1 TABLET (10 MG TOTAL) BY MOUTH AT BEDTIME AS NEEDED FOR SLEEP 90 tablet 0   Adalimumab -adbm (CYLTEZO , 2 PEN,) 40 MG/0.4ML AJKT Inject 40 mg into  the skin every 14 (fourteen) days. (Patient not taking: Reported on 01/15/2024) 2 each 0   clobetasol  cream (TEMOVATE ) 0.05 % Apply 1 application topically 2 (two) times daily. (Patient not taking: Reported on 01/15/2024) 30 g 0   No current facility-administered medications for this visit.    Allergies:   Patient has no known allergies.    Social History:  The patient  reports that she has been smoking cigarettes. She started smoking about 50 years ago. She has a 97.7 pack-year smoking history. She has been exposed to tobacco smoke. She has never used smokeless tobacco. She reports that she does not drink alcohol and does not use drugs.   Family History:  The patient's family history includes ADD / ADHD in her son and son; Alcohol abuse in her father; Asthma in her sister; Emphysema in her mother; Healthy in her daughter and son; Heart disease in her father; Psoriasis in her sister.    ROS:  Please see the history of present illness.   Otherwise, review of systems are positive for none.   All other systems are reviewed and negative.    PHYSICAL EXAM: VS:  BP (!) 120/50 (BP Location: Right Arm, Cuff Size: Normal)   Pulse 70   Ht 4' 11 (1.499 m)   Wt 111 lb 8 oz (50.6 kg)   SpO2 98%   BMI 22.52 kg/m  , BMI Body mass index is 22.52 kg/m. GEN: Well nourished, well developed, in no acute distress  HEENT: normal  Neck: no JVD, or masses.  Right carotid bruit Cardiac: RRR; no rubs, or gallops,no edema .  1 out of 6 systolic murmur in the aortic area Respiratory:  clear to auscultation bilaterally, normal work of breathing GI: soft, nontender, nondistended, + BS MS: no deformity or atrophy  Skin: warm and dry, no rash Neuro:  Strength and sensation are intact Psych: euthymic mood, full affect Vascular: Femoral pulses +1 on the right and +2 on the left.  Distal pulses are palpable bilaterally but stronger on the left.   EKG:  EKG is not ordered today.    Recent Labs: 10/23/2023:  ALT 11; BUN 14; Creatinine, Ser 0.85; Potassium 4.6; Sodium 137 11/22/2023: Hemoglobin 12.6; Platelets 206    Lipid Panel    Component Value Date/Time   CHOL 131 10/23/2023 1522   TRIG 105.0 10/23/2023 1522   HDL 40.40 10/23/2023 1522   CHOLHDL 3 10/23/2023 1522   VLDL 21.0 10/23/2023 1522   LDLCALC 69 10/23/2023 1522      Wt Readings from Last 3 Encounters:  01/15/24 111 lb 8 oz (50.6 kg)  11/22/23 114 lb 12.8 oz (52.1 kg)  11/21/23 114 lb 2 oz (51.8 kg)           11/21/2023   11:26 AM 11/14/2023   11:18 AM 10/24/2023   10:38 AM  PAD Screen  Previous PAD dx? No  No  No   Previous surgical procedure? Yes  Yes  Yes   Dates of procedures 2016  2016  2016   Pain with walking? No  No  No   Feet/toe relief with dangling? No  No  No   Painful, non-healing ulcers? No  No  No   Extremities discolored? No  No  No      Manually entered by patient      ASSESSMENT AND PLAN:  1.  Peripheral arterial disease, status post endovascular intervention and covered stent placement to the left common iliac artery with angioplasty of the left external iliac artery.  No recurrent claudication in the left side and recent ABI was normal.  She does have mildly reduced ABI on the right and borderline disease in the iliac arteries.  However, this does not appear to be critical.  I do not think her right hip pain is related to peripheral arterial disease.  2.  Coronary artery disease involving native coronary arteries without angina: Continue medical therapy  3.  Essential hypertension: Blood pressure is controlled  4.  Hyperlipidemia: Given extensive cardiovascular disease, I recommend more aggressive lipid management and a target LDL of less than 55.  Most recent lipid profile showed an LDL of 69.  I elected to increase atorvastatin  to 40 mg daily.  5.  Carotid stenosis: This was moderate.  Repeat carotid Doppler in 1 year and continue aggressive treatment of this practice.  6.  Tobacco  use: I discussed with her the importance of smoking cessation.   Disposition:   FU with me in 6 months  Signed,  Deatrice Cage, MD  01/15/2024 3:58 PM     [facility] "

## 2024-01-15 NOTE — Patient Instructions (Signed)
 Medication Instructions:  INCREASE the Atorvastatin  to 40 mg once daily  *If you need a refill on your cardiac medications before your next appointment, please call your pharmacy*  Lab Work: None ordered If you have labs (blood work) drawn today and your tests are completely normal, you will receive your results only by: MyChart Message (if you have MyChart) OR A paper copy in the mail If you have any lab test that is abnormal or we need to change your treatment, we will call you to review the results.  Testing/Procedures: None ordered  Follow-Up: At [facility], you and your health needs are our priority.  As part of our continuing mission to provide you with exceptional heart care, our providers are all part of one team.  This team includes your primary Cardiologist (physician) and Advanced Practice Providers or APPs (Physician Assistants and Nurse Practitioners) who all work together to provide you with the care you need, when you need it.  Your next appointment:   6 month(s)  Provider:   You may see Dr. Darron or one of the following Advanced Practice Providers on your designated Care Team:   Lonni Meager, NP Lesley Maffucci, PA-C Bernardino Bring, PA-C Cadence [place], PA-C Tylene Lunch, NP Barnie Hila, NP    We recommend signing up for the patient portal called MyChart.  Sign up information is provided on this After Visit Summary.  MyChart is used to connect with patients for Virtual Visits (Telemedicine).  Patients are able to view lab/test results, encounter notes, upcoming appointments, etc.  Non-urgent messages can be sent to your provider as well.   To learn more about what you can do with MyChart, go to [email or web address].

## 2024-01-21 ENCOUNTER — Ambulatory Visit: Admitting: Medical

## 2024-01-26 ENCOUNTER — Other Ambulatory Visit: Payer: Self-pay | Admitting: Primary Care

## 2024-01-26 DIAGNOSIS — G4700 Insomnia, unspecified: Secondary | ICD-10-CM

## 2024-04-21 ENCOUNTER — Ambulatory Visit: Admitting: Physician Assistant
